# Patient Record
Sex: Male | Born: 1935 | Race: White | Hispanic: No | Marital: Married | Smoking: Never smoker
Health system: Southern US, Community
[De-identification: ages and names within clinical notes are randomized; demographics above are authoritative.]

## PROBLEM LIST (undated history)

## (undated) DIAGNOSIS — K469 Unspecified abdominal hernia without obstruction or gangrene: Secondary | ICD-10-CM

## (undated) DIAGNOSIS — I82409 Acute embolism and thrombosis of unspecified deep veins of unspecified lower extremity: Secondary | ICD-10-CM

## (undated) DIAGNOSIS — I480 Paroxysmal atrial fibrillation: Secondary | ICD-10-CM

## (undated) DIAGNOSIS — N183 Chronic kidney disease, stage 3 unspecified: Secondary | ICD-10-CM

## (undated) DIAGNOSIS — E785 Hyperlipidemia, unspecified: Secondary | ICD-10-CM

## (undated) DIAGNOSIS — I2699 Other pulmonary embolism without acute cor pulmonale: Secondary | ICD-10-CM

## (undated) DIAGNOSIS — I351 Nonrheumatic aortic (valve) insufficiency: Secondary | ICD-10-CM

## (undated) DIAGNOSIS — N400 Enlarged prostate without lower urinary tract symptoms: Secondary | ICD-10-CM

## (undated) DIAGNOSIS — R911 Solitary pulmonary nodule: Secondary | ICD-10-CM

## (undated) DIAGNOSIS — R55 Syncope and collapse: Secondary | ICD-10-CM

## (undated) DIAGNOSIS — I77810 Thoracic aortic ectasia: Secondary | ICD-10-CM

## (undated) DIAGNOSIS — K219 Gastro-esophageal reflux disease without esophagitis: Secondary | ICD-10-CM

## (undated) DIAGNOSIS — I4510 Unspecified right bundle-branch block: Secondary | ICD-10-CM

## (undated) HISTORY — DX: Hyperlipidemia, unspecified: E78.5

## (undated) HISTORY — PX: LIPOMA RESECTION: SHX23

## (undated) HISTORY — DX: Chronic kidney disease, stage 3 (moderate): N18.3

## (undated) HISTORY — DX: Paroxysmal atrial fibrillation: I48.0

## (undated) HISTORY — DX: Unspecified right bundle-branch block: I45.10

## (undated) HISTORY — DX: Nonrheumatic aortic (valve) insufficiency: I35.1

## (undated) HISTORY — DX: Unspecified abdominal hernia without obstruction or gangrene: K46.9

## (undated) HISTORY — DX: Acute embolism and thrombosis of unspecified deep veins of unspecified lower extremity: I82.409

## (undated) HISTORY — PX: OTHER SURGICAL HISTORY: SHX169

## (undated) HISTORY — DX: Benign prostatic hyperplasia without lower urinary tract symptoms: N40.0

## (undated) HISTORY — DX: Other pulmonary embolism without acute cor pulmonale: I26.99

## (undated) HISTORY — DX: Gastro-esophageal reflux disease without esophagitis: K21.9

## (undated) HISTORY — PX: INGUINAL HERNIA REPAIR: SUR1180

## (undated) HISTORY — DX: Chronic kidney disease, stage 3 unspecified: N18.30

## (undated) HISTORY — DX: Solitary pulmonary nodule: R91.1

## (undated) HISTORY — DX: Thoracic aortic ectasia: I77.810

## (undated) HISTORY — DX: Syncope and collapse: R55

---
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
## 2006-11-26 ENCOUNTER — Ambulatory Visit: Payer: Self-pay | Admitting: Internal Medicine

## 2006-11-26 ENCOUNTER — Encounter: Payer: Self-pay | Admitting: Internal Medicine

## 2006-11-26 ENCOUNTER — Inpatient Hospital Stay (HOSPITAL_COMMUNITY): Admission: AD | Admit: 2006-11-26 | Discharge: 2006-12-04 | Payer: Self-pay | Admitting: Internal Medicine

## 2006-11-26 ENCOUNTER — Ambulatory Visit: Payer: Self-pay | Admitting: Cardiology

## 2006-11-26 ENCOUNTER — Ambulatory Visit: Payer: Self-pay | Admitting: Vascular Surgery

## 2006-11-26 LAB — CONVERTED CEMR LAB
Basophils Relative: 0.2 % (ref 0.0–1.0)
CO2: 23 meq/L (ref 19–32)
Creatinine, Ser: 1.4 mg/dL (ref 0.4–1.5)
HCT: 38.7 % — ABNORMAL LOW (ref 39.0–52.0)
Hemoglobin: 13.6 g/dL (ref 13.0–17.0)
INR: 1.1 (ref 0.9–2.0)
MCHC: 35 g/dL (ref 30.0–36.0)
Monocytes Absolute: 1.4 10*3/uL — ABNORMAL HIGH (ref 0.2–0.7)
Neutrophils Relative %: 71.5 % (ref 43.0–77.0)
Potassium: 4.5 meq/L (ref 3.5–5.1)
Prothrombin Time: 13 s (ref 10.0–14.0)
RDW: 12.1 % (ref 11.5–14.6)
Sodium: 135 meq/L (ref 135–145)

## 2006-11-27 ENCOUNTER — Encounter: Payer: Self-pay | Admitting: Internal Medicine

## 2006-12-02 ENCOUNTER — Encounter: Payer: Self-pay | Admitting: Internal Medicine

## 2006-12-05 ENCOUNTER — Ambulatory Visit: Payer: Self-pay | Admitting: Internal Medicine

## 2006-12-12 ENCOUNTER — Ambulatory Visit: Payer: Self-pay | Admitting: Cardiology

## 2006-12-12 ENCOUNTER — Ambulatory Visit: Payer: Self-pay | Admitting: Internal Medicine

## 2006-12-12 LAB — CONVERTED CEMR LAB
Eosinophils Absolute: 0.2 10*3/uL (ref 0.0–0.6)
Lymphocytes Relative: 46.5 % — ABNORMAL HIGH (ref 12.0–46.0)
MCV: 96.9 fL (ref 78.0–100.0)
Monocytes Relative: 11.3 % — ABNORMAL HIGH (ref 3.0–11.0)
Neutro Abs: 2.3 10*3/uL (ref 1.4–7.7)
Platelets: 335 10*3/uL (ref 150–400)
Sed Rate: 15 mm/hr (ref 0–20)

## 2006-12-18 ENCOUNTER — Ambulatory Visit: Payer: Self-pay

## 2006-12-18 ENCOUNTER — Ambulatory Visit: Payer: Self-pay | Admitting: Cardiovascular Disease

## 2006-12-18 ENCOUNTER — Ambulatory Visit: Payer: Self-pay | Admitting: Internal Medicine

## 2006-12-31 ENCOUNTER — Ambulatory Visit (HOSPITAL_COMMUNITY): Admission: RE | Admit: 2006-12-31 | Discharge: 2006-12-31 | Payer: Self-pay | Admitting: Internal Medicine

## 2007-01-02 ENCOUNTER — Ambulatory Visit: Payer: Self-pay | Admitting: Internal Medicine

## 2007-01-19 ENCOUNTER — Ambulatory Visit: Payer: Self-pay | Admitting: Internal Medicine

## 2007-01-19 LAB — CONVERTED CEMR LAB
BUN: 17 mg/dL (ref 6–23)
CO2: 26 meq/L (ref 19–32)
GFR calc Af Amer: 64 mL/min
Potassium: 3.8 meq/L (ref 3.5–5.1)

## 2007-02-03 ENCOUNTER — Ambulatory Visit: Payer: Self-pay | Admitting: Cardiology

## 2007-02-03 ENCOUNTER — Ambulatory Visit (HOSPITAL_COMMUNITY): Admission: RE | Admit: 2007-02-03 | Discharge: 2007-02-03 | Payer: Self-pay | Admitting: Internal Medicine

## 2007-02-03 ENCOUNTER — Ambulatory Visit: Payer: Self-pay | Admitting: Internal Medicine

## 2007-02-12 ENCOUNTER — Ambulatory Visit: Payer: Self-pay

## 2007-02-12 ENCOUNTER — Ambulatory Visit: Payer: Self-pay | Admitting: Cardiology

## 2007-02-14 DIAGNOSIS — I82409 Acute embolism and thrombosis of unspecified deep veins of unspecified lower extremity: Secondary | ICD-10-CM

## 2007-02-14 DIAGNOSIS — Z87898 Personal history of other specified conditions: Secondary | ICD-10-CM

## 2007-02-14 DIAGNOSIS — Z7901 Long term (current) use of anticoagulants: Secondary | ICD-10-CM

## 2007-02-14 DIAGNOSIS — I2699 Other pulmonary embolism without acute cor pulmonale: Secondary | ICD-10-CM

## 2007-02-14 DIAGNOSIS — K219 Gastro-esophageal reflux disease without esophagitis: Secondary | ICD-10-CM

## 2007-02-14 DIAGNOSIS — R911 Solitary pulmonary nodule: Secondary | ICD-10-CM

## 2007-04-14 ENCOUNTER — Ambulatory Visit: Payer: Self-pay | Admitting: Internal Medicine

## 2007-06-11 ENCOUNTER — Telehealth (INDEPENDENT_AMBULATORY_CARE_PROVIDER_SITE_OTHER): Payer: Self-pay | Admitting: *Deleted

## 2007-06-11 ENCOUNTER — Encounter: Payer: Self-pay | Admitting: Internal Medicine

## 2007-07-14 ENCOUNTER — Ambulatory Visit: Payer: Self-pay | Admitting: Internal Medicine

## 2007-07-30 ENCOUNTER — Encounter: Payer: Self-pay | Admitting: Internal Medicine

## 2007-07-30 ENCOUNTER — Telehealth (INDEPENDENT_AMBULATORY_CARE_PROVIDER_SITE_OTHER): Payer: Self-pay | Admitting: *Deleted

## 2007-07-30 ENCOUNTER — Ambulatory Visit (HOSPITAL_COMMUNITY): Admission: RE | Admit: 2007-07-30 | Discharge: 2007-07-30 | Payer: Self-pay | Admitting: Internal Medicine

## 2007-07-30 ENCOUNTER — Ambulatory Visit: Payer: Self-pay | Admitting: *Deleted

## 2007-07-31 ENCOUNTER — Telehealth (INDEPENDENT_AMBULATORY_CARE_PROVIDER_SITE_OTHER): Payer: Self-pay | Admitting: *Deleted

## 2007-10-20 ENCOUNTER — Encounter: Payer: Self-pay | Admitting: Internal Medicine

## 2007-12-02 ENCOUNTER — Ambulatory Visit: Payer: Self-pay | Admitting: Internal Medicine

## 2008-01-25 ENCOUNTER — Telehealth (INDEPENDENT_AMBULATORY_CARE_PROVIDER_SITE_OTHER): Payer: Self-pay | Admitting: *Deleted

## 2008-01-25 ENCOUNTER — Telehealth: Payer: Self-pay | Admitting: Internal Medicine

## 2008-03-07 ENCOUNTER — Telehealth: Payer: Self-pay | Admitting: Internal Medicine

## 2008-04-19 ENCOUNTER — Ambulatory Visit: Payer: Self-pay | Admitting: Internal Medicine

## 2008-04-21 ENCOUNTER — Ambulatory Visit: Payer: Self-pay

## 2008-10-26 DIAGNOSIS — R079 Chest pain, unspecified: Secondary | ICD-10-CM

## 2008-10-27 ENCOUNTER — Ambulatory Visit: Payer: Self-pay

## 2008-10-27 ENCOUNTER — Telehealth: Payer: Self-pay | Admitting: Internal Medicine

## 2008-10-27 ENCOUNTER — Ambulatory Visit: Payer: Self-pay | Admitting: Internal Medicine

## 2009-03-25 IMAGING — XA IR TRANSCATH RETRIEVAL FB
1 series · 12 of 18 positions shown · IV contrast (agent unspecified)
Comparison: none

CLINICAL DATA: 73-year-old male with recent history of DVT complicated by pulmonary embolus.    Temporary filter was inserted.  The patient is now currently doing well on anticoagulation, which will be continued for 6 months.  Because of this, the IVC will be studied, and if there is no clot burden or retained clot in the filter, filter removal will be attempted. 
ULTRASOUND GUIDANCE FOR VASCULAR ACCESS:
IVC CATHETERIZATION AND VENOGRAM:
TRANSCATHETER RETRIEVAL OF IVC FILTER:
Radiologist:  Brehiner Merchancano, M.D.
Guidance:  Ultrasound and fluoroscopic. 
Complications:  No immediate complications. 
Medications:  1 mg Versed and 50 mcg fentanyl. 
Sedation time:  30 minutes. 
Contrast:  60 cc. 
Fluoroscopy:  4.6 minutes. 
Procedure/Findings:  Informed consent was obtained from the patient following explanation of the procedure, risks, benefits, and alternatives.  The patient understands, agrees, and consents.  All questions were addressed. 
Right IJ micropuncture venous access was performed with ultrasound.  Images were obtained for documentation.  
An 0.018 guidewire was advanced centrally followed by a 4-French dilator.  This allowed insertion of a 0.035 guidewire advanced into the IVC.  Over the guidewire, a 5-French pigtail was advanced below the filter within the IVC bifurcation.  IVC venogram was performed. 
IVC Venogram:  IVC is patent and normal.  No evidence of retained clot, stenosis, or occlusive disease.  Filter is in stable position within the infrarenal IVC.  The pigtail was removed over an Amplatz guidewire.  Guidewire access was manipulated into the left iliac venous system.  This approximated the guidewire closest to the filter apex.  Dilatation was performed to advance the retrieval sheath.  The retrieval sheath was advanced to the apex.  Through the sheath and over the guidewire, the retrieval umbrella was advanced and positioned at the apex.  The filter apex was captured with the retrieval device and retracted into the sheath.  The filter completely collapsed and was removed over the guidewire without complication.  The outer sheath was syringe aspirated and flushed with saline.  Contrast injection was repeated for a follow-up IVC venogram demonstrating a patent IVC with no complication, clot, or abnormality. 
Hemostasis was obtained with compression.  The patient tolerated the procedure well.  There were no immediate complications.

[Series 1: run · 12 of 18 slices shown]
[im 1/18]
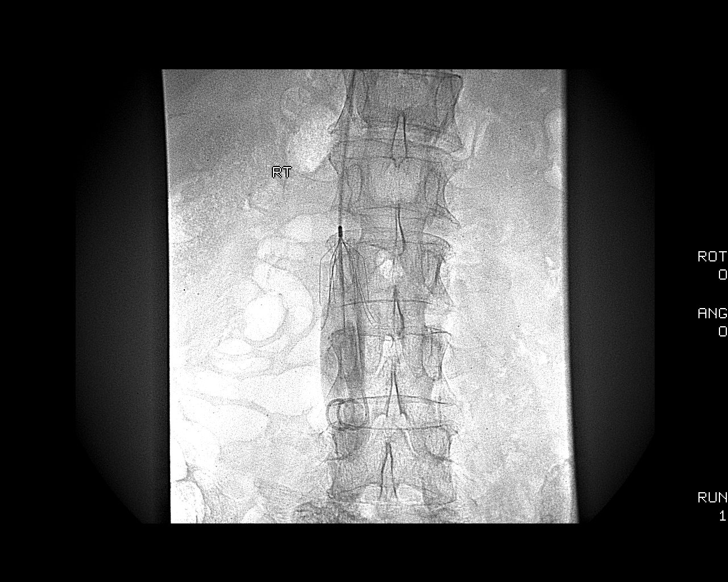
[im 3/18]
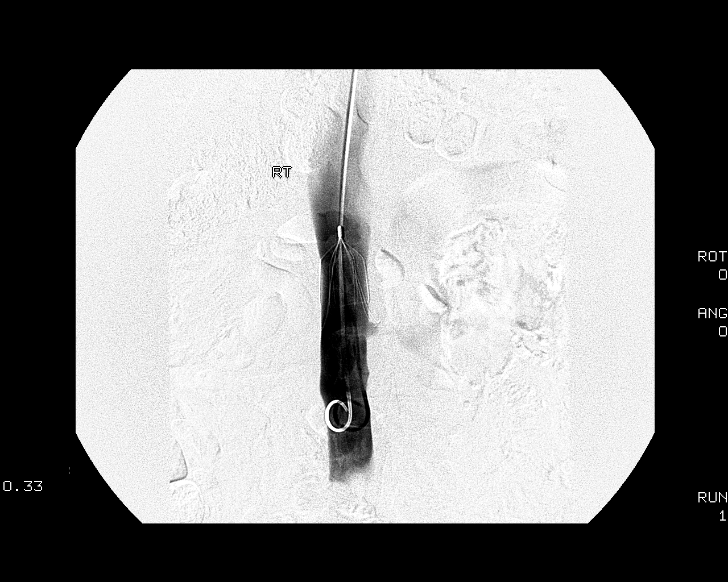
[im 4/18]
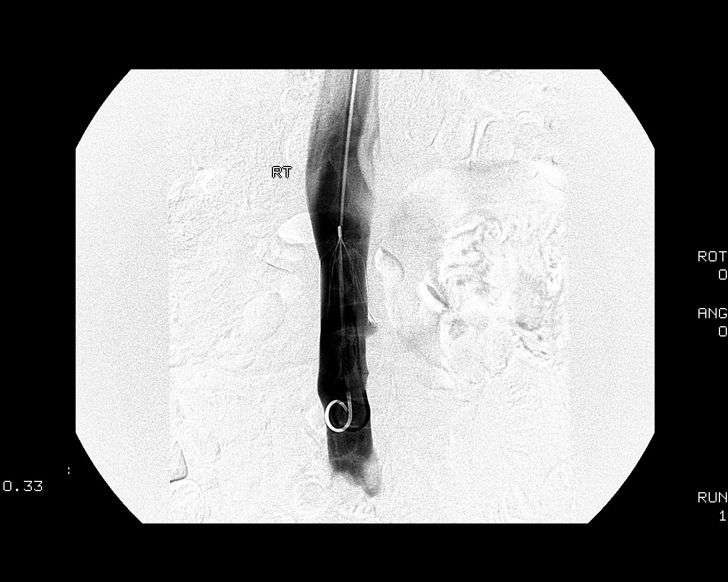
[im 6/18]
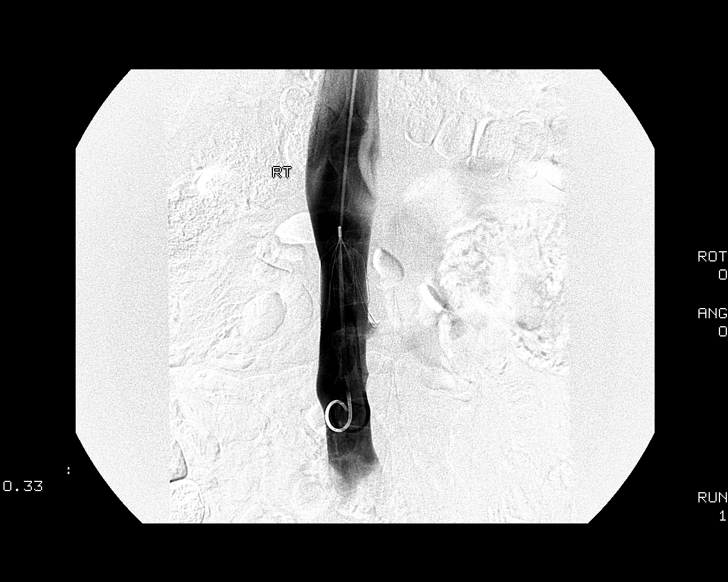
[im 7/18]
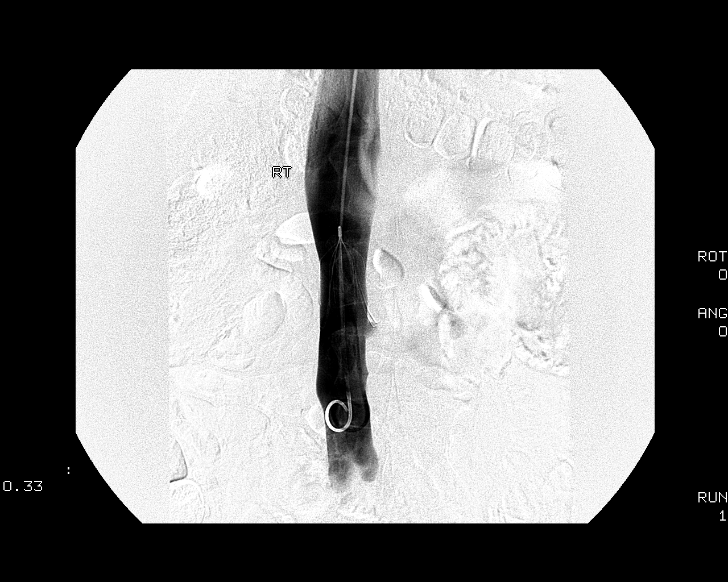
[im 9/18]
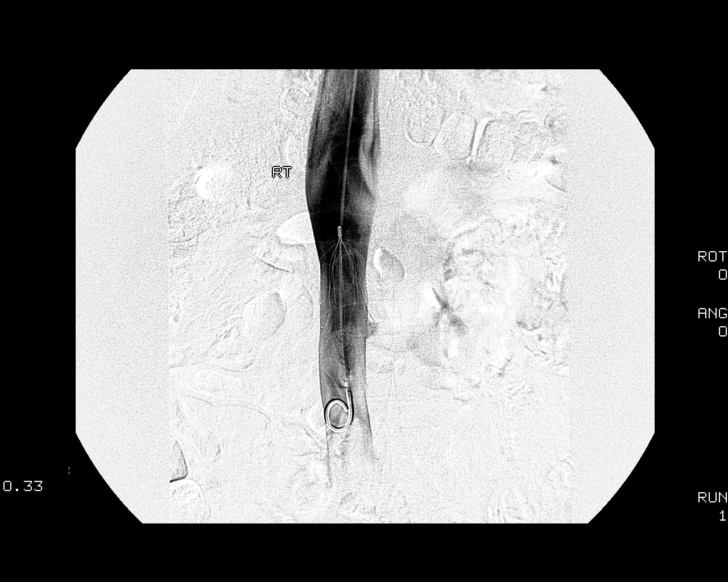
[im 10/18]
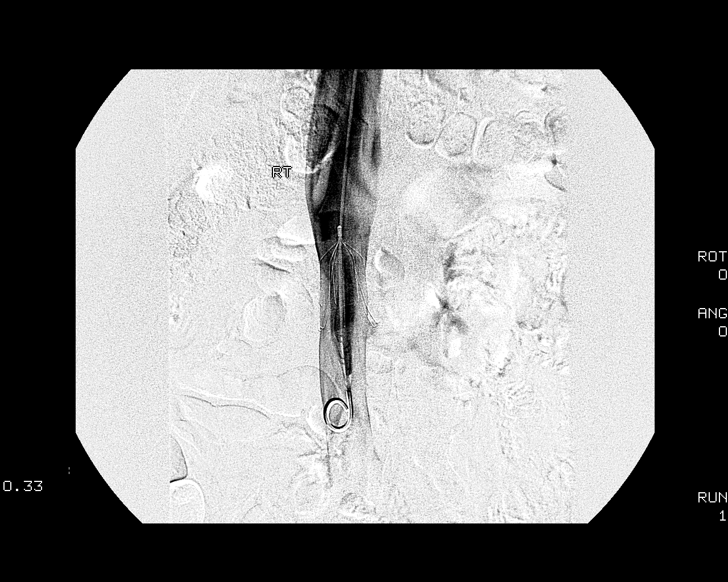
[im 12/18]
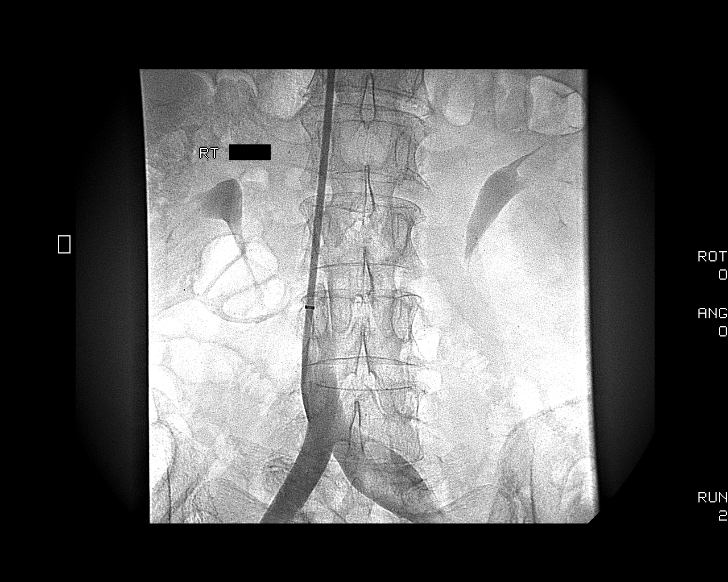
[im 13/18]
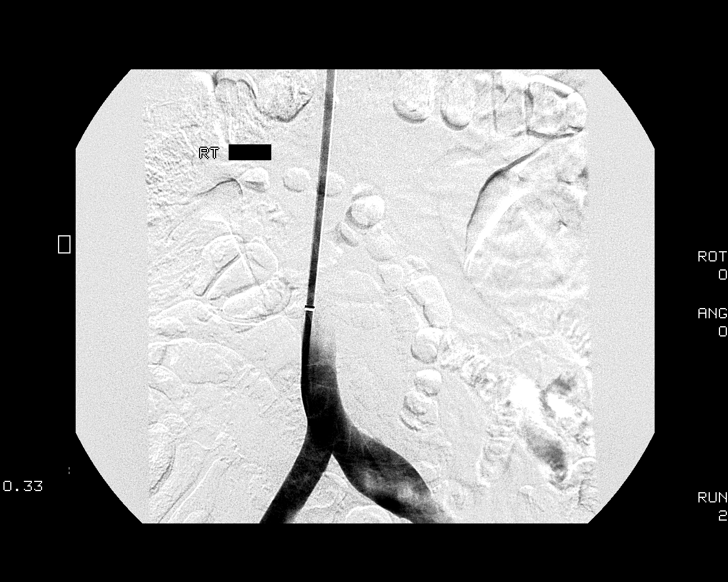
[im 15/18]
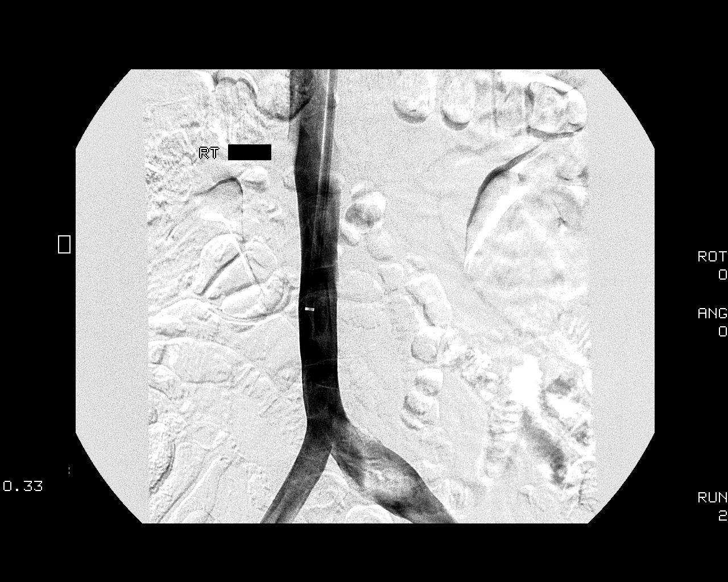
[im 16/18]
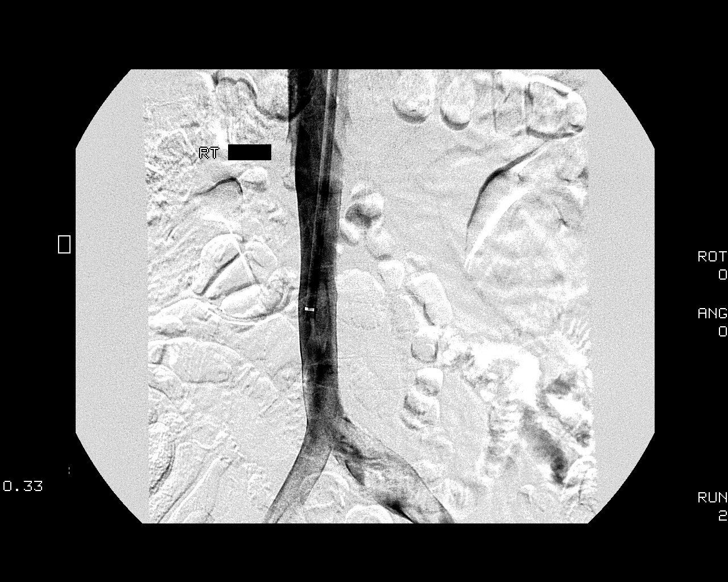
[im 18/18]
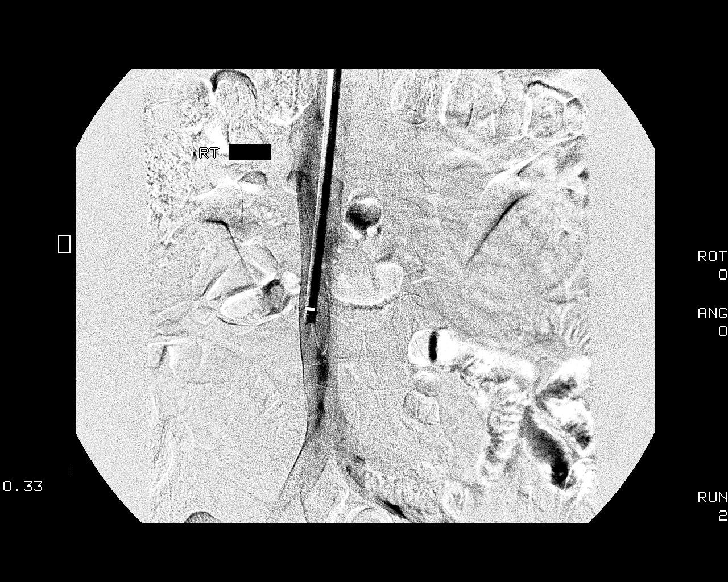

[12 of 18 positions shown; findings below may reference images not displayed]

IMPRESSION: Successful removal of an infrarenal IVC G2 filter.  No complication.

## 2009-04-13 ENCOUNTER — Encounter: Payer: Self-pay | Admitting: Internal Medicine

## 2009-08-25 ENCOUNTER — Telehealth (INDEPENDENT_AMBULATORY_CARE_PROVIDER_SITE_OTHER): Payer: Self-pay | Admitting: *Deleted

## 2009-08-28 ENCOUNTER — Encounter: Payer: Self-pay | Admitting: Internal Medicine

## 2009-08-29 ENCOUNTER — Telehealth: Payer: Self-pay | Admitting: Internal Medicine

## 2009-09-01 ENCOUNTER — Ambulatory Visit: Payer: Self-pay | Admitting: Internal Medicine

## 2009-09-01 DIAGNOSIS — J209 Acute bronchitis, unspecified: Secondary | ICD-10-CM

## 2009-09-01 DIAGNOSIS — J018 Other acute sinusitis: Secondary | ICD-10-CM

## 2009-10-06 ENCOUNTER — Ambulatory Visit: Payer: Self-pay | Admitting: Internal Medicine

## 2009-10-13 ENCOUNTER — Encounter: Payer: Self-pay | Admitting: Internal Medicine

## 2010-06-06 NOTE — Progress Notes (Signed)
Summary: appt  Phone Note Call from Patient Call back at Home Phone [phone]   Caller: Patient Call For: young Reason for Call: Talk to Nurse Summary of Call: fever, sinus trouble,chest cold.  Went to [facility], thought he had pna, got meds, but still has crackling in left lung, had cxr, local doc says there is scare tissue in left lung, says bronchitis may turn into emphysema.    Wants to see CDY before 1st available on 05/23 - can you work something out for him? Initial call taken by: Eugene Gavia,  August 29, 2009 11:44 AM  Follow-up for Phone Call        pt requesting an appt, last seen 2009 by CY. Per KW ok to offer Thursday 08/31/09 at 11:30. pt advised of appt and advised to arrive at 11:15. Carron Curie CMA  August 29, 2009 12:12 PM

## 2010-06-06 NOTE — Assessment & Plan Note (Signed)
Summary: congestion/jd   Primary Provider/Referring Provider:  Tedra Senegal  CC:  Accute visit-congestion(chest);recheck from recent visit with PCP. [name]  History of Present Illness: 12/02/07- 77 year old man, previously seen for Pulmonary  embolism with DVT.  Returns now for follow-up.  His primary physician in [place] tracks his Coumadin.  On CT scan in September.he had lung nodules right middle lobe and right lower lobe.  "Branching, suggested inflammatory".  He has had more frequent waking at night due to  urinary infection and had a herniated disk treated conservatively.  He has not had new chest complaints, cough, phlegm, fever or chills, purulent or bloody sputum, adenopathy, or edema.  September 01, 2009- Hx DVT/PE..........Justin Kitchenwife here Hx PE in July of 2008. He had remained on coumadin. No bleeding or problems with it- being managed by Dr Tedra Senegal. He had episodes of rhinosinusitis and bronchitis in December with some lingering or recurrence. He has had a hx of winter-time sinus infections and bronchitis. The recent bout cleared but then recurred in April when he wheezed more. His primary doctor gave mucinex, dulera 100/5, prednisone taper, R-Tanna, Levaquin(finished). Hx nasal stuffiness from old baseball injury. He expects airway issues to resolve with the summer. He is concerned that he feels weak. Some dyspnea with exertion. CXR- 08/28/09- described some scarring in left base. He feels a little shakey from meds and voice feels a little weak.Was faint in [place]- transient.  He was concerned about the CXR "scarring".        Current Medications (verified): 1)  Adult Aspirin Low Strength 81 Mg  Tbdp (Aspirin) .[name].. 1 By Mouth Once Daily 2)  Uroxatral 10 Mg  Tb24 (Alfuzosin Hcl) .[name].. 1 By Mouth Once Daily 3)  Crestor 10 Mg  Tabs (Rosuvastatin Calcium) .[name].. 1 By Mouth At Bedtime 4)  Nexium 40 Mg  Cpdr (Esomeprazole Magnesium) .[name].. 1 By Mouth Once Daily 5)  Fish Oil   Oil (Fish Oil)  .[name].. 1 By Mouth Once Daily 6)  Avodart 0.5 Mg  Caps (Dutasteride) .[name].. 1 Once Daily 7)  Niaspan 1000 Mg Cr-Tabs (Niacin (Antihyperlipidemic)) .[name].. 1 By Mouth Once Daily 8)  Warfarin Sodium 5 Mg Tabs (Warfarin Sodium) .... Use As Directed By Anticoagulation Clinic 9)  Amitiza 8 Mcg Caps (Lubiprostone) .... Take 1 By Mouth Two Times A Day  Allergies (verified): 1)  ! 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   Risk Factors: Smoking Status: never (10/27/2008)  Review of Systems      See HPI       The patient complains of prolonged cough.  The patient denies anorexia, fever, weight loss, weight gain, vision loss, decreased hearing, hoarseness, chest pain, syncope, dyspnea on exertion, peripheral edema, headaches, hemoptysis, abdominal pain, and severe indigestion/heartburn.         Skin at right ankle slight hyperpigmentation  Vital Signs:  Patient profile:   77 year old male Height:  72 inches Weight:      195.50 pounds BMI:     26.61 O2 Sat:      95 % on Room air Pulse rate:   89 / minute BP sitting:   118 / 78  (left arm) Cuff size:   regular  Vitals Entered By: Reynaldo Minium CMA (September 01, 2009 9:11 AM)  O2 Flow:  Room air  Physical Exam  Additional Exam:  GENERAL:  A/Ox3; pleasant & cooperative.NAD HEENT:  [place]/AT, EOM-wnl,  PERRLA, periorbital edema, EACs-clear, TMs-wnl, NOSE-septal deviation, external deviation, THROAT-clear & wnl. Mallampati  III-IV NECK:  Supple w/ fair ROM; no JVD; normal carotid impulses w/o bruits; no thyromegaly or nodules palpated; no lymphadenopathy. CHEST: Clear to P&A, loose upper airway cough. HEART:  RRR, no m/r/g  heard ABDOMEN:  Soft & nt; nml bowel sounds; no organomegaly or masses detected. EXT: Warm bilat,  no calf pain, edema, clubbing, pulses intact,  Skin: no rash/lesion     Impression & Recommendations:  Problem # 1:  Hx of DVT (ICD-453.40) We discussed decision about long term coumadin, absent a known ongoing risk.  He can decide with his doctor whether/ when to come off. No recurrence.  Problem # 2:  RHINOSINUSITIS, ACUTE (ICD-461.8) He doesn't want surgery for his significantly misaligned nose. Suggest he try Lloyd Huger med saline rinse.  Problem # 3:  ACUTE BRONCHITIS (ICD-466.0)  This should clear as his upper airway does. I suggested he have f/u CXR in a few months.  Orders: Est. Patient Level III (16109)  Medications Added to Medication List This Visit: 1)  Amitiza 8 Mcg Caps (Lubiprostone) .... Take 1 by mouth two times a day  Patient Instructions: 1)  Please schedule a return in one year unless needed sooner. 2)  Try Lloyd Huger med type saline nasal rinse- Neti pot or squeeze bottle, once or twice a day when needed. 3)  If nasal congestion doesn't clear, your doctor may want to consider a limited CT of your sinuses. 4)  Suggest follow-up CXR in a month or two to see that "scarring " in the left lower lobe is stable or clearing.

## 2010-06-06 NOTE — Assessment & Plan Note (Signed)
Summary: rov/ gd   Primary Provider:  Tedra Senegal   History of Present Illness: [name] is a very pleasant 77 year old male with a history of pulmonary emboli diagnosed in July of 2008. Remains on coumadin. He does not have any known history of coronary artery disease.  He had a normal stress echocardiogram in September of 2007 with his previous cardiologist.  He also has a history of hyperlipidemia and recurrent syncope secondary to vasovagal syndrome with a positive tilt test.   Had an episode of CP in 3/10 and saw Dr. Ladona Ridgel for unschedule visit. Had GXT which was normal.   Returns for yearly f/u. In march had a presyncopal episode from what sounds like volume depletion. Since that time doing well. It has not recurred. Not exercising regulalry. Denies CP or SOB. Bruises easily.    Current Medications (verified): 1)  Adult Aspirin Low Strength 81 Mg  Tbdp (Aspirin) .[name].. 1 By Mouth Once Daily 2)  Uroxatral 10 Mg  Tb24 (Alfuzosin Hcl) .[name].. 1 By Mouth Once Daily 3)  Crestor 10 Mg  Tabs (Rosuvastatin Calcium) .[name].. 1 By Mouth At Bedtime 4)  Nexium 40 Mg  Cpdr (Esomeprazole Magnesium) .[name].. 1 By Mouth Once Daily 5)  Fish Oil   Oil (Fish Oil) .[name].. 1 By Mouth Once Daily 6)  Avodart 0.5 Mg  Caps (Dutasteride) .[name].. 1 Once Daily 7)  Niaspan 500 Mg Cr-Tabs (Niacin (Antihyperlipidemic)) .[name].. 1 By Mouth Daily 8)  Warfarin Sodium 5 Mg Tabs (Warfarin Sodium) .... Use As Directed By Anticoagulation Clinic 9)  Amitiza 8 Mcg Caps (Lubiprostone) .... Take 1 By Mouth Two Times A Day 10)  Fexofenadine Hcl 180 Mg Tabs (Fexofenadine Hcl) .[name].. 1 By Mouth Daily 11)  Aspirin 81 Mg  Tabs (Aspirin) .[name].. 1 By Mouth Daily  Allergies (verified): 1)  ! * Multivitamin  Past History:  Past Medical History: Hx of PE (ICD-415.19) Hx of DVT (ICD-453.40) Hx of LUNG NODULE (ICD-518.89) GERD (ICD-530.81) BENIGN PROSTATIC HYPERTROPHY, HX OF (ICD-V13.8) Hyperlipidemia Recurrent syncope secondary to vasovagal syndrome  w/positive tilt test CP   --normal GXT  Review of Systems       As per HPI and past medical history; otherwise all systems negative.   Vital Signs:  Patient profile:   77 year old male Height:      72 inches Weight:      196 pounds BMI:     26.68 Pulse rate:   82 / minute Resp:     16 per minute BP sitting:   128 / 82  (right arm)  Vitals Entered By: Marrion Coy, CNA (October 06, 2009 3:23 PM)  Physical Exam  General:  Gen: well appearing. no resp difficulty HEENT: normal Neck: supple. no JVD. Carotids 2+ bilat; no bruits. No lymphadenopathy or thryomegaly appreciated. Cor: PMI nondisplaced. Regular rate & rhythm. No rubs, gallops, murmur. Lungs: clear Abdomen: soft, nontender, nondistended. No hepatosplenomegaly. No bruits or masses. Good bowel sounds. Extremities: no cyanosis, clubbing, rash, edema Neuro: alert & orientedx3, cranial nerves grossly intact. moves all 4 extremities w/o difficulty. affect pleasant    Impression & Recommendations:  Problem # 1:  Hx of PE (ICD-415.19) We had a long talk about the duration of Coumadin therapy.  Given an unprovoked pulmonary embolus there is some rationale to treat lifelong, however, more standard it is done for a year to 18 months. As he had a life-threatening PE in past and has been doing well on coumadin we have opted to consider.  Other Orders: EKG w/ Interpretation (93000)  Patient Instructions: 1)  Your physician wants you to follow-up in:  1 year.  You will receive a reminder letter in the mail two months in advance. If you don't receive a letter, please call our office to schedule the follow-up appointment.   Appended Document: rov/ gd note faxed to Dr Maxcine Ham at [phone]

## 2010-06-06 NOTE — Progress Notes (Signed)
  Phone Note Call from Patient   Caller: Patient Reason for Call: Acute Illness Summary of Call: Called answering service this am regarding 2 day history of congestion, low grade fever which is now resolved, wheezing and sinus drainage.  Was given zpak, allergra by primary.  Denies chest pain, sputum production. Pt reports last seen by Dr. Maple [name] in 08.  Has new PCP- Dr. Tedra Senegal.  Pt questioning when he can come off coumadin-apparently is still on and having labs for such.  Wants to stop.  Discussed he needs to make appt with Dr. Maple [name] on Monday and follow up regarding coumadin.  Instructed to continue zpak and allegra until gone.  Also stressed importance of follow up on coumadin/labs. Initial call taken by: Canary Brim NP-C,  August 25, 2009 4:14 PM

## 2010-08-29 ENCOUNTER — Encounter: Payer: Self-pay | Admitting: Internal Medicine

## 2010-08-31 ENCOUNTER — Encounter: Payer: Self-pay | Admitting: Internal Medicine

## 2010-08-31 ENCOUNTER — Ambulatory Visit (INDEPENDENT_AMBULATORY_CARE_PROVIDER_SITE_OTHER): Payer: Self-pay | Admitting: Internal Medicine

## 2010-08-31 VITALS — BP 124/82 | HR 68 | Ht 72.0 in | Wt 199.6 lb

## 2010-08-31 DIAGNOSIS — J209 Acute bronchitis, unspecified: Secondary | ICD-10-CM

## 2010-08-31 DIAGNOSIS — I2699 Other pulmonary embolism without acute cor pulmonale: Secondary | ICD-10-CM

## 2010-08-31 DIAGNOSIS — J984 Other disorders of lung: Secondary | ICD-10-CM

## 2010-08-31 NOTE — Assessment & Plan Note (Addendum)
What we had seen 3-4 years ago looked like a bronchiolitis with some mucus plugging. With nothing sen on last year's film, we will drop the issue.

## 2010-08-31 NOTE — Assessment & Plan Note (Addendum)
His mild DOE is moslty deconditioning and obesity. He had used Dulera once before with an acute bronchitis. I will let him retry a sample now and see if it helps anything. I doubt major cardiopulmonary change.

## 2010-08-31 NOTE — Progress Notes (Signed)
  Subjective:    Patient ID: [name], male    DOB: Oct 28, 1932, 77 y.o.   MRN: [id number]  HPI 29 yoM never smoker with past hx of DVT/ PE and bronchitis, complicated by hx of lung nodule and GERD. Last here September 01, 2009. His doctor prefers to keep INR 2.5 to 3.5. There have been no more clots or bleeding. His cardiologists following him after remote question of Mitral Prolapse have suggested to him that maybe he should stay on coumadin. This week in pollen season has noted a very little wheeze as he goes to bed, attributed to pollen. He notes gradually a little more dyspnea with exertion- discussed in terms of age and fitness. He notes snore despite nasal strips. At home he always sleeps on his side.    Review of Systems See HPI Constitutional:   No weight loss, night sweats,  Fevers, chills, fatigue, lassitude. HEENT:   No headaches,  Difficulty swallowing,  Tooth/dental problems,  Sore throat,                No sneezing, itching, ear ache,,   CV:  No chest pain,  Orthopnea, PND, swelling in lower extremities, anasarca, dizziness, palpitations  GI  No heartburn, indigestion, abdominal pain, nausea, vomiting, diarrhea, change in bowel habits, loss of appetite  Resp: .  No excess mucus, no productive cough,  No non-productive cough,  No coughing up of blood.  No change in color of mucus.  No wheezing.   Skin: no rash or lesions.  GU: no dysuria, change in color of urine, no urgency or frequency.  No flank pain.  MS:  No joint pain or swelling.  No decreased range of motion.  No back pain.  Psych:  No change in mood or affect. No depression or anxiety.  No memory loss.     Objective:   Physical Exam General- Alert, Oriented, Affect-appropriate, Distress- none acute  Skin- rash-none, lesions- none, excoriation- none  Lymphadenopathy- none  Head- atraumatic  Eyes- Gross vision intact, PERRLA, conjunctivae clear secretions  Ears- Normal- Hearing, canals, Tm L ,R  ,  Nose- Stuffy, Septal dev, mucus, some mucus bridging,   No-  polyps, erosion, perforation   Throat- Mallampati III , mucosa clear , drainage- none, tonsils- atrophic  Neck- flexible , trachea midline, no stridor , thyroid nl, carotid no bruit  Chest - symmetrical excursion , unlabored     Heart/CV- RRR , no murmur , no gallop  , no rub, nl s1 s2                     - JVD- none , edema- none, stasis changes- none, varices- none     Lung- clear to P&A, wheeze- none, cough- none , dullness-none, rub- none     Chest wall-  Abd- tender-no, distended-no, bowel sounds-present, HSM- no.     Overweight with abdominal obesity.   Br/ Gen/ Rectal- Not done, not indicated  Extrem- cyanosis- none, clubbing, none, atrophy- none, strength- nl  Neuro- grossly intact to observation         Assessment & Plan:

## 2010-08-31 NOTE — Assessment & Plan Note (Signed)
No recurrence. I have left it up to to his PCP to decide when to stop coumadin.

## 2010-08-31 NOTE — Patient Instructions (Signed)
Consider trying Afrin before dental visits  Ask your dentist about oral appliances for sleep apnea and snoring. A good local source here would be Dr Althea Grimmer- orthodontist.   Sample Dulera 100-5    2 puffs and rinse mouth, twice daily. You should know in a week it it is making any improvement in your shortness of breath. It will still be good for you to try to walk more

## 2010-09-18 NOTE — H&P (Signed)
[name], [name]            ACCOUNT NO.:  [id number]   MEDICAL RECORD NO.:  [id number]          PATIENT TYPE:  INP   LOCATION:  2104                         FACILITY:  [facility]   PHYSICIAN:  Clinton D. Maple [name], MD, [name], FACPDATE OF BIRTH:  February 10, 1935   DATE OF ADMISSION:  11/26/2006  DATE OF DISCHARGE:                              HISTORY & PHYSICAL   PROBLEMS:  1. Multiple pulmonary emboli with infarction.  2. Deep vein thrombosis left leg.  3. Mitral valve prolapse by history.  4. Elevated cholesterol.  5. Esophageal reflux disease.   HISTORY:  This is a 77 year old nonsmoker in good general health  previously.  Three weeks ago he was walking on a hill, hit his toe and  ended up needing his left great toenail removed.  He was treated then  with Keflex.  Soon after that he had pain in the left hip consistent  with a bursitis by description.  He became short of breath and was  admitted to [facility] about 2 weeks ago with a diagnosis of  pneumonia.  He did not have much cough, no high fever or chills and no  significant sputum.  He was treated initially with Avelox.  Then this  was changed to Rocephin and Zithromax when per family chest x-ray got  worse.  He was discharged on 1 week of intravenous therapy and took an  additional 7 days of Zithromax and Vantin.  He remained short of breath  and weak with some transient posterior right side chest pain.  Wife  became especially concerned last night with he was more nauseated and  dyspneic.  She called her cousin, Dr. Abbey Daniels, who called me last  night.  Family wish to be treated in [place],  and he was brought  into my office as a new patient work-in this morning.  In the office he  gave history of left calf pain, was tender in the left calf and had a  positive Homans' sign.  He was sent for spiral CT scan which was done  this afternoon at the [facility], and I was called  reporting scan positive for  bilateral pulmonary emboli with infarction  at least on the right side.  He was given a loading dose of 10,000 units  of heparin at the [facility] CT office and transported for admission to  [facility].  He has now been seen by pharmacy for protocol management of  heparin and warfarin.  A duplex Doppler of leg veins has been completed  and positive for extensive clot through the left vascular system from  the calf proximally including some loose clot in the femoral vein.   REVIEW OF SYSTEMS:  Significant primarily for weakness and dyspnea.  He  has had some bruising from recent Lovenox but reports no bleeding, no  palpitation.  No anginal-type pain.  He is aware of intestinal gas but  is not actively having heartburn, nausea or vomiting.  There has been no  change in bowel or bladder.  Temperatures have been running 99 to 100  with no hard chills.   FAMILY  HISTORY:  Father died of myocardial infarction in his 69s and  also had renal insufficiency.  Mother is still alive in her 21s with  diabetes.  There is no family history of other clotting disorder known.   PAST MEDICAL HISTORY:  1. Mitral valve prolapse treated with Plavix and atenolol after his      primary physician, now retired, noted extra beats.  There is no      history of other cardiac disease.  2. Hypertension.  3. Syncopal episodes fainting in the past associated with discovery      of the extra beats mentioned above.  4. Inguinal hernia repair x3  5. No history of diabetes or cancer.   VACCINATIONS:  He has had pneumococcal vaccine probably twice.   SOCIAL HISTORY:  Never smoked, married.  Son is an orthopedic nurse at  [employer].  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  He had some  concern about the damp environment of the office space where he worked  and questioned whether any of his respiratory problems were from mold.   OBJECTIVE:   Temperature 98.4, BP 103/77, pulse regular at about 80,  oxygen saturation 100% at rest on 2 liters prongs but during  conversation it will drop as low as 88%.  Skin ecchymoses reflecting IV  insertions and Lovenox therapy in the bilateral forearms.  No adenopathy  found.  He is alert, oriented, a little dyspneic with short sentences  despite his oxygen.  HEENT:  Conjunctivae are clear.  Oral mucosa is clear.  Neck veins are  not distended.  There is no stridor.  CHEST:  Quiet bilaterally.  I hear a trace of crackle in the right  posterior base but no pleural rub.  No real rales, no cough.  HEART:  Regular rhythm without rub or murmur.  I cannot hear a click.  P2 does not seem increased.  ABDOMEN:  Soft, no hepatosplenomegaly.  Bowel sounds present.  GENITALIA AND RECTAL:  Not examined at this time, not directly  pertinent.  EXTREMITIES:  Positive left Homans'.  No ankle edema.  Some tenderness  of left calf.  Recently resected left great toenail.   CHEST X-RAY:  I reviewed the film done at our office today as available  on the computer screen demonstrating infiltrates in the left mid lung  zone and left lower lung zone probably involving the superior segment of  left lower lobe.  Diaphragm was elevated.  No definite pleural effusion   IMPRESSION:  1. Multiple pulmonary emboli.  2. Extensive deep vein thrombosis left leg.  The trigger may have been      toenail resection and associated reduced mobility.  3. History of mitral valve prolapse.  Was already on Plavix and      aspirin and note that he had received Lovenox during his hospital      stay at [facility].  4. Elevated cholesterol.  Had been on Crestor.  5. Esophageal reflux disease.  Has been on Protonix.  6. Recent hospitalization at [facility] for pneumonia.  We will not      know whether he actually had the pneumonia or all of this illness      has been related to deep venous thrombosis and pulmonary embolism.      At this time  it would appear he has had enough antibiotic coverage      and we  can watch clinical course.  The key is to progress his      anticoagulation therapy.  I am discussing with colleagues the      consideration of filter insertion because of the large residual      clot load in his leg.      Clinton D. Maple [name], MD, Tonny Bollman, [name]  Electronically Signed     CDY/MEDQ  D:  11/26/2006  T:  11/27/2006  Job:  [id number]

## 2010-09-18 NOTE — Assessment & Plan Note (Signed)
[facility]                             PULMONARY OFFICE NOTE   NAME:[name], [name]                     MRN:          [id number]  DATE:01/19/2007                            DOB:          [birth year]    PROBLEM LIST:  1. Pulmonary embolism with left leg deep vein thrombosis/Coumadin.  2. Previous diagnosis of mitral valve prolapse, not recently confirmed      on echocardiogram.  3. Benign prostatic hypertrophy.  4. Esophageal reflux.  5. History of lung nodules.   HISTORY:  [name] has been removed.  He is back at work part time.  He  primary physician suggested a trial of Prozac, and [name] is  ruminating on this.  He says on one day he had some left chest twinges  which spontaneously resolved.  An ultrasound of the abdomen, he says,  was good.  He has a pending appointment for cardiology followup with  Dr. Gala Romney.  He is having some of [name] Prothrombin times checked at  [name] primary physicians office, but he still comes some to get checked by  the coagulation clinic at [facility], and he has discussed [name] medications  with the pharmacist there.  He seems to be getting gradually more  active.   MEDICATION:  1. Aspirin 81 mg.  2. Uroxatral 10 mg.  3. Tricor 145 mg.  4. Coumadin 5 mg.  5. Crestor 10 mg.  6. Nexium 40 mg.  7. Fish oil.   We discussed the history that [name] original CT scan around last January  in [place] had shown lung nodules needing followup, and the  likelihood that they would have been obscured by the infiltrates  associated with [name] pulmonary embolism.  A CT angiogram, now, should be  showing progress in the lysis of infrapulmonary vascular clot.  It may  be possible by comparing all 3 CT scans to assess the original lung  nodules if they do not remain obscured.  He is interested in having this  done.   OBJECTIVE:  Weight 190 pounds, BP 120/70, pulse regular 80, room air  saturation 98%.  No apparent  distress.  No neck vein distension or stridor.  Work of breathing has not increased.  Lungs sound clear.  Pulse is regular.  I do not hear a murmur.   IMPRESSION:  1. Status post deep vein thrombosis with pulmonary emboli in late      July, now on Coumadin.  2. Lung nodules wanting some followup.   PLAN:  1. Chemistry panel for renal clearance.  2. Repeat CT angiogram, both to assess the status of [name] pulmonary      emboli from 2 months ago, and to look for lung nodules that could      be compared with [name] January, 2008 CT scan from [place].  [name] wife      is going to bring a copy of [name] disc.  Schedule return 3 months,      earlier p.r.n.     Clinton D. Maple [name], MD, Tonny Bollman, [name]  Electronically Signed    CDY/MedQ  DD: 01/19/2007  DT: 01/20/2007  Job #: [id number]   cc:   Primus Bravo, MD

## 2010-09-18 NOTE — Assessment & Plan Note (Signed)
[facility]                            CARDIOLOGY OFFICE NOTE   KAVON, VALENZA                     MRN:          [id number]  DATE:07/14/2007                            DOB:          1932-09-21    PRIMARY CARE PHYSICIAN:  Ruthann Cancer, MD at [facility].   PULMONOLOGIST:  Rennis Chris. Maple [name], MD.   INTERVAL HISTORY:  Mr. Trost is a very pleasant 77 year old male with  a history of pulmonary emboli diagnosed in 2008.  He also has a history  of reported mitral valve prolapse.  However, echocardiogram did not  confirm this.  He denies any history of coronary artery disease.  He had  a stress echocardiogram with his previous cardiologist in September 2007  which was normal.  He has never had a cardiac catheterization.  He also  has a history of  hyperlipidemia and recurrent syncope secondary to vasovagal syndrome  with a positive tilt test.   He returns today for routine followup.  He is doing well.  No chest pain  or shortness of breath.  He is not walking on a routine basis, however.  About 6 weeks ago, he had some problems with black stools.  This lasted  2 days and then resolved.  He saw a GI doctor who recommended upper GI  series, however, he has not had this yet.  He has not had any problems  with recurrent bleeding.   CURRENT MEDICATIONS:  1. Aspirin 81.  2. Uroxatral 10 mg a day.  3. Tricor 145 a day.  4. Coumadin 5 a day.  5. Crestor 10 a day.  6. Nexium 40 a day.  7. Fish oil.   PHYSICAL EXAMINATION:  GENERAL:  He is well-appearing in no acute  distress.  He ambulates around the clinic without any respiratory  difficulty.  VITAL SIGNS:  Blood pressure is 118/64, heart rate is 69, weight is 194.  HEENT:  Normal.  NECK:  Supple.  No JVD.  Carotids are 2+ bilaterally without any bruits.  There is no lymphadenopathy or thyromegaly.  CARDIAC:  Regular rate and rhythm.  No murmurs, rubs or gallops.  There  are no clicks.  LUNGS:   Clear.  ABDOMEN:  Obese, nontender, nondistended.  No hepatosplenomegaly, no  bruits, no masses.  Good bowel sounds.  EXTREMITIES:  Warm with no cyanosis, clubbing or edema.  No rash.  NEURO:  Alert and oriented x3.  Cranial nerves II-XII are intact.  He  moves all 4 extremities without difficulty.  Affect is pleasant.   DIAGNOSTICS:  EKG shows normal sinus rhythm at a rate of 69.  No ST/T  wave abnormalities.   ASSESSMENT/PLAN:  1. History of pulmonary emboli.  He is on Coumadin.  He is doing well.  2. Hyperlipidemia.  This is followed by his primary care physician.  3. History of mitral valve prolapse.  This is not evident on      echocardiogram.  I have reassured him.   DISPOSITION:  He is doing well.  We will see him back in 9 months for  routine followup.  I have encouraged him to be more active.     Bevelyn Buckles. Bensimhon, MD  Electronically Signed    DRB/MedQ  DD: 07/14/2007  DT: 07/15/2007  Job #: [id number]   cc:   Joni Fears D. Maple [name], MD, [name], [name]  Ruthann Cancer, MD

## 2010-09-18 NOTE — Assessment & Plan Note (Signed)
[name]                            CARDIOLOGY OFFICE NOTE   [name], [name]                     MRN:          [id number]  DATE:04/19/2008                            DOB:          [birth year]    PRIMARY CARE PHYSICIAN:  Dr. Tedra Daniels in [place].   PULMONOLOGIST:  Dr. Fannie Daniels.   INTERVAL HISTORY:  [name] is a very pleasant 77 year old male with a  history of pulmonary emboli diagnosed in July of 2008.  He does not have  any known history of coronary artery disease.  He had a normal stress  echocardiogram in September of 2007 with his previous cardiologist.  He  also has a history of hyperlipidemia and recurrent syncope secondary to  vasovagal syndrome with a positive tilt test.   He returns today for routine follow-up.  Overall he is doing well.  He  has been in discussion with Dr. Fannie Daniels about stopping his Coumadin  and actually stopped it about 4 weeks ago.  He does note that he has  been short of breath but this is chronic for him.  He denies any chest  pain, he thinks his shortness of breath however may be a little bit  worse.  He has not had any leg swelling.   He had multiple questions today and also did express the possibility  that he is considering a lawsuit against [name] for initially  diagnosing his pulmonary emboli as pneumonia.  I told him this was  difficult situation and that it can sometimes be difficult to discern  between the two given the overlapping clinical scenarios.   CURRENT MEDICATIONS:  1. Aspirin 81.  2. Uroxatral 10 mg.  3. Tricor 145.  4. Crestor 10 mg a day.  5. Nexium 40 mg a day.  6. Fish oil 2400 mg a day.  7. Avodart.   PHYSICAL EXAMINATION:  He is well-appearing, in no acute distress.  He  ambulates around the clinic without any respiratory difficulty.  Blood  pressure is 116/78, heart rate is 83, weight is 199.  HEENT:  Normal.  NECK:  Supple, no JVD, carotids are 2+  bilaterally without any bruits.  There is no lymphadenopathy or thyromegaly.  CARDIAC:  His PMI is nondisplaced.  Regular rate and rhythm.  No  murmurs, rubs or gallops.  No clicks.  LUNGS:  Clear.  ABDOMEN:  Obese, nontender, nondistended.  No obvious  hepatosplenomegaly, no bruits, no masses.  Good bowel sounds.  EXTREMITIES:  Warm with no cyanosis, clubbing or edema.  No cords.  No  rash.  NEURO:  Alert and oriented x3, cranial nerves II-XII are grossly intact.  Moves all fur extremities without difficulty.  Affect is pleasant.   EKG shows normal sinus rhythm with a right bundle branch block which is  new for him compared to previous, he previously had an incomplete right  bundle.  No significant ST-T wave abnormalities.   ASSESSMENT/PLAN:  1. Pulmonary emboli.  We had a long talk about the duration of      Coumadin therapy, I also discussed this with [name].  Given an      unprovoked pulmonary embolus there is some rationale to treat      lifelong, however, more standard it is done for a year to 18      months.  Now that he is off Coumadin I recommend getting a high      sensitivity D-dimer.  If this is elevated we may want to consider      reinitiating.  2. Dyspnea.  I think this is mostly due to deconditioning but he does      have risk factors for coronary disease as well as recurrent      pulmonary emboli.  We will start with a treadmill Myoview to sort      out if it is getting worse, may have to consider a repeat CT scan      if it turns out to be deconditioning he will obviously need to get      involved in an exercise program.  3. Hyperlipidemia, this will be followed by his primary care doctor.  4. History of syncope, this is quiescent.     Justin Buckles. Bensimhon, MD  Electronically Signed    [name]  DD: 04/19/2008  DT: 04/19/2008  Job #: [id number]

## 2010-09-18 NOTE — Assessment & Plan Note (Signed)
[facility]                             PULMONARY OFFICE NOTE   NAME:[name], Justin                     MRN:          [id number]  DATE:11/26/2006                            DOB:          Apr 10, 1933    PROBLEM:  A 77 year old man with recent hospitalization for pneumonia  and failure to improve.   HISTORY:  I was called last night by Dr. Konrad Dolores about this gentleman  who's wife is Dr. Donnetta Hail cousin. She had been concerned about lack of  progress. Approximately 3 weeks or 4 weeks ago he had been walking, had  slipped on wet ground in some way, and injured his toe such that it was  treated with removal of the left great toenail. Some where around there  he temporarily had some pain in the left hip and proximal thigh. He had  some dyspnea, malaise, and at some point some right posterior chest  pain. He was hospitalized at [facility] for 7 days with a  diagnosis of pneumonia, treated initially with Avelox. When a follow up  chest x-ray looked worse, he reports that antibiotic coverage was  changed to Rocephin and Zithromax, which he took for 7 days before being  discharged 7 days ago on Vantin and Zithromax. He had been having some  night sweats off and on through the recent illness, with temperatures in  the 99 to 100 degree range. There had not been much cough and no sputum,  or chills. Last night he had malaise, nausea, some cough, and he said he  nearly vomited. It sounded if maybe the antibiotics had upset his  stomach. He has a history of heartburn and this was somewhat worse but  there was no other chest pain. The left hip and thigh pain has eased but  this week he has been aware of soreness in the left calf. 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 He did get Lovenox DVT prophylaxis at  [facility]. They  tried calling back to [facility] but were not able to  establish follow up. Discharge plan had been for him to return to the  care of his primary physician who has however retired. He has begun the  process of meeting a new primary physician but has not established one  at this time. Dr. Jennette Kettle explained the situation and we were able to get  Mr. 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   MEDICATIONS:  1. Uroxatral 10 mg 1 daily.  2. Atenolol 50 mg 1 daily.  3. Zithromax 500 mg daily.  4. Vantin 200 mg b.i.d.  5. Plavix 75 mg.  6. Colace 100 mg times 2.  7. Tricor 145 mg once daily.  8. Protonix 40 mg daily.  9. Crestor 10 mg daily.  10.Aspirin 81 mg daily.  We are not clear about his pneumococcal vaccine status. There are no  medication allergies known.   REVIEW  OF SYSTEMS:  Heartburn suppressed with Nexium and Tums. He  prefers Protonix. Acute symptoms per HPI. Weight had been stable until  his acute illness during which he has lost about 12 pounds. No bleeding,  high fever, or chills.   PAST MEDICAL HISTORY:  1. Mitral valve prolapse with remote echocardiogram, history of      fainting episodes, and autonomic insufficiency on tilt test, which      confirmed vagal sensitivity. Because of this he takes atropine      for procedures. This was worked up at [employer] [facility] [facility].  2. Palpitations, apparently noted by his primary physicians in the      past and because of which he was put on atenolol.  3. Benign prostatic hypertrophy.  4. Esophageal reflux.  5. Six months ago he had walking pneumonia treated at his primary      office with an antibiotic. At that time he had a CT scan at [facility] showing old damage right lung.   SURGERIES:  Were 3 inguinal hernia repairs.   SOCIAL HISTORY:  Never smoked. He is married living with his wife. Son  is an orthopedic nurse at [employer]. He is a retired Psychologist, occupational now  working as a Counsellor at [employer]. He says  the office he  had been using for several years there was quite damp, such that he had  had to put in a dehumidifier, and it was unpleasant enough he says he  plans not to return to work in that building. This raised some  discussion of whether any of his problems could be related to mold.   FAMILY HISTORY:  Father died at age 78 from complication of  arthrosclerosis including heart failure and renal failure. Mother is  alive at age 54 with diabetes.   OBJECTIVE:  Weight 187 pounds, blood pressure 118/74, pulse 74, room air  saturation 97%. He is dyspneic, speaking short sentences, trim,  intelligent.  SKIN: I do not find rash. Adenopathy non obvious at the supraclavicular  areas or axially.  HEENT: Nasal airway and pharynx clear. No strider. No neck vein  distension or thyromegaly.  CHEST: Breath sounds are quiet. I think I may be hearing a faint crackle  at the right posterior base otherwise I do not hear rales, rhonchi,  wheeze, or rub.  HEART: Regular rhythm, P2 is not increased. I do not hear rub, murmur,  or gallop.  ABDOMEN: Soft without hepatosplenomegaly.  EXTREMITIES: Tenderness left calf, positive Homan's. No edema or  cyanosis. He has had surgical removal of left great toenail.   RADIOLOGY:  Chest x-ray is available without report at this time,  showing opacity in the superior segment of the right lower lobe and also  projecting over the right mid lung zone. Nonspecific appearance. This  could represent a resolving pneumonia. Right hemidiaphragm is somewhat  elevated but I do not appreciate an effusion.   IMPRESSION:  Recent treatment for pneumonia. This may a partly treated  pneumonia, including possibly an atypical organism. Lack of high fever,  purulent discharge, definite organism on culture, or clear response to  antibiotics raised concern. I have explained that pneumonia can persist  and be slow to resolve. So that may still be the right diagnosis. I am   concerned about the calf pain in association with dyspnea and feel that  it is important that we exclude a pulmonary embolism, although there is  no family or personal  history.   PLAN:  1. CT scan of the chest with contrast to rule out pulmonary embolism.  2. Blood for CBC, basic metabolic panel, protime, and PTT.  3. RAST allergy panel to begin addressing his concern about mold      problem related to his office work place.  4. Pending results of CT scan and labs, he will continue his current      antibiotics to completion and schedule return in 3 weeks, earlier      p.r.n.     Clinton D. Maple [name], MD, Tonny Bollman, [name]  Electronically Signed    CDY/MedQ  DD: 11/26/2006  DT: 11/27/2006  Job #: [id number]

## 2010-09-18 NOTE — Assessment & Plan Note (Signed)
[facility]                             PULMONARY OFFICE NOTE   NAME:CAMPBELLOstin, Mathey                     MRN:          [id number]  DATE:12/12/2006                            DOB:          [birth year]    PROBLEM LIST:  1. Pulmonary embolism with left leg DVT.  2. Previous diagnosis of mitral valve prolapse, not recently      confirmed.  3. Benign prostatic hypertrophy.  4. Esophageal reflux.   HISTORY:  This gentleman was hospitalized first at [facility] with  diagnosis of right lung pneumonia and subsequently at [facility] with recognition of extensive left leg vein DVT and bilateral  pulmonary emboli.  A removable vena cava filter was placed because of  clot burden and he was discharged on Coumadin for followup through the  [facility].  INR today was 2.2.  Continuing Coumadin 5 mg.  He is scheduled for a visit next week but comes now reporting recurrent  night sweats on 2-3 occasions in the last week or so, sufficient to get  the sheets wet.  He has been taking aspirin 81 mg at 10 p.m. recently.  He has had no sudden events, no discomfort, no cough, no leg pain.  He  elevates his legs when sitting or in a car.  He is looking into getting  elastic hose as discussed.  There has been no bleeding.   MEDICATIONS:  1. Aspirin 81 mg.  2. Uroxatral 10 mg.  3. TriCor 145 mg.  4. Coumadin 5 mg.  5. Crestor 10 mg.  6. Nexium 40 mg.   No medication allergy.   OBJECTIVE:  VITAL SIGNS:  Weight 184 pounds, temperature 97.9, BP  100/76, pulse regular 103, room air saturation 97%.  GENERAL:  He looks relaxed and comfortable.  MUSCULOSKELETAL:  He is wearing sandals without elastic hose.  He has no  leg edema.  I do not find adenopathy.  NECK:  There is no neck vein distention or stridor.  HEART:  Sounds are regular.  V2 is not increased.   IMPRESSION:  Deep vein thrombosis with pulmonary embolism.   I am not sure why he is having  night sweats.  It may be too easy to  blame nighttime aspirin but he is going to try changing that dose to  morning.  I have discussed sweating as a symptom.  Hopefully, it no  longer reflects active phlebitis.   PLAN:  1. Continue efforts to avoid stagnation of blood flow.  2. Blood today for CBC and a sedimentation rate.  3. Change aspirin to take in the morning.  4. Keep appointment for next week as planned.  5. He will continue with his Coumadin.  6. We plan to repeat Doppler evaluation of the left leg veins and if      that shows good stabilization, then we will have radiology remove      his filter.  7. He is working to establish with a new primary physician close to      his home and he will continue pulmonary followup here and  cardiology followup with Dr. Milas Kocher.     Clinton D. Maple [name], MD, Tonny Bollman, [name]  Electronically Signed    CDY/MedQ  DD: 12/12/2006  DT: 12/12/2006  Job #: [id number]

## 2010-09-18 NOTE — Assessment & Plan Note (Signed)
[facility]                                 ON-CALL NOTE   NAME:CAMPBELLLuian, Schumpert                  MRN:          [id number]  DATE:12/11/2006                            DOB:          Nov 10, 1932    Mr. Novello called me this evening to report that he had been having  some night sweats.  The patient was recently discharged from the  hospital on July 31st with pulmonary embolus and had been started on  Coumadin 5 mg daily along with aspirin, Crestor, Protonix, TriCor,  Colace and Uroxatral.  The patient has no complaints of chest pain,  shortness of breath, dizziness, nausea or vomiting but he states over  the last couple of nights he has had some sweatiness at night that goes  away after he gets up and goes to the bathroom and cools off.  He states  that he did not know that if it meant anything that he wanted to pass it  on.  The patient does have a followup appointment in the morning with  the [facility]in Clinic along with a followup appointment with Dr. Gala Romney  on the 14th of August and a followup appointment with his primary care  physician next week as well.   I have advised the patient to pass this on to the Coumadin Clinic  nurses.  Although I do not believe this is indicative of anything, they  will talk with him more.  I asked him if he was having a fever or chills  in between these episodes of sweating and he said he did not.  This  should be reported to the Coumadin Clinic nurses and also I advised him  to report it to his primary care physician on the next appointment.  Patient verbalized understanding.  I have made no changes in his  medication.     Bettey Mare. Lyman Bishop, NP  Electronically Signed    KML/MedQ  DD: 12/11/2006  DT: 12/12/2006  Job #: [id number]

## 2010-09-18 NOTE — Discharge Summary (Signed)
[name], [name]            ACCOUNT NO.:  [id number]   MEDICAL RECORD NO.:  [id number]          PATIENT TYPE:  INP   LOCATION:  3730                         FACILITY:  [facility]   PHYSICIAN:  Clinton D. Maple [name], MD, [name], FACPDATE OF BIRTH:  10-24-34   DATE OF ADMISSION:  11/26/2006  DATE OF DISCHARGE:  12/04/2006                               DISCHARGE SUMMARY   DISCHARGE DIAGNOSES:  1. Acute/subacute bilateral pulmonary emboli.  2. Left lower extremity deep venous thrombosis.  3. 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  4. Status post inferior vena cava filter placement on November 27, 2006.  5. History of diastolic dysfunction and questionable history of mitral      valve prolapse.   LABORATORY DATA:  Date, December 04, 2006, hemoglobin 15.3, hematocrit 45.5,  white blood cell count 7.6, platelet count 480.  Date, December 02, 2006,  sodium 135, potassium 4.5, chloride 101, CO2 25, BUN 16, creatinine  1.55, glucose 111.  Date, December 04, 2006, INR 2.5.  Date, December 03, 2006,  INR 2.5.   BRIEF HISTORY:  A 77 year old nonsmoker in good general health  previously.  Reported 3 weeks prior to admission, he was walking on a  hill, hit his toe, and ended up needing his left great toenail removed.  He was treated with Keflex for this.  He soon began to note pain in his  left hip consistent with bursitis by description.  He later became short  of breath and was admitted to [facility] about 2 weeks ago with a  diagnosis of pneumonia.  He did not have much in the way of cough, no  fever, chills, or significant sputum production.  He was initially  treated with Avelox.  (This was later changed to Rocephin and Zithromax  when, per family, chest x-ray got worse).  He was discharged on 1 week  of intravenous therapy and took 7 days of Zithromax and Vantin.  He  remained short of breath and weak with some transient posterior right-  sided chest pain.  He became clinically worse, and therefore  his wife  called his primary care physician.  He was seen by Dr. Maple [name] as a work  in visit on the morning of the 23rd.  In the pulmonary office, he gave a  history of significant left calf pain, which was tender to the touch  with a positive Homan's sign as well as pleuritic-type chest pain.  He  underwent spiral CT which was done in the afternoon at [facility] which reported positive bilateral pulmonary emboli with question of  pulmonary infarct on the right.  He was given a loading dose of 1000  units of heparin and transferred to [facility].  A duplex  Doppler of the left main was completed and demonstrated extensive clot  throughout the left vascular system of the calf and left femoral vein.  He was admitted initially to the intensive care for further evaluation  and therapy.   HOSPITAL COURSE BY DISCHARGE DIAGNOSES:  1. Acute/subacute bilateral pulmonary emboli with left venous      thromboembolism and  right lower lobe pulmonary infarct.  Mr.      Bannan was admitted to the medical intensive care for close      observation after initial presentation.  Therapy consisted of IV      heparin, echocardiogram to evaluate hemodynamic status in the      setting of acute pulmonary emboli and supplemental oxygen.  The      echocardiogram was obtained in the intensive care that demonstrated      overall left ventricular systolic function to be normal.  There was      question of hypokinesis of the inferior and lateral walls.  The RV      was difficult to evaluate due to poor parasternal and apical views,      but function appeared to be mildly impaired.  The right ventricle      was mildly dilated.  He had no hemodynamic compromise and remained      hemodynamically stable.  He was started on Coumadin on day of      admission with 48 hours of overlapping therapy for Coumadin and      heparin after evaluating therapeutic INR.  Heparin has since been      discontinued.  He  was discharged to home on daily Coumadin which      will be managed initially by the [facility] and then      eventually by his primary care physician, Dr. Vito Backers.  A      followup venous ultrasound was obtained on December 02, 2006, which      continued to demonstrate continued evidence of DVT in the mid      femoral, popliteal vein with some improvement in the posterior      tibial vein.  There was no superficial thrombus noted.  As      previously noted, he did undergo IVC filter given his significant      deep vein thrombosis on the 24th.  He will be, again, discharged to      home on Coumadin therapy with 2 followup by Dr. Fannie Knee.  Upon      time of discharge, he has been liberated from oxygen support, able      to tolerate activity without difficulty, and feels markedly      improved.  Upon time of followup will decide on further follow up      on deep vein thrombosis as well as potential to remove extractable      IVC filter.  2. History of mitral valve prolapse.  This was not validated on      echocardiogram.  There have been no cardiac sequelae during this      hospitalization.  [name] will follow up with [facility]      [facility]iology, Dr. Arvilla Meres, in 1-2 months following      discharge.  The [facility] Cardiology service will contact Mr.      Orvan Falconer and arrange this appointment.  I verified this with their      clinic.  3. Hypercholesterolemia.  The plan for this is to continue on current      regimen.  4. Gastroesophageal reflux disease.  The plan for this is to continue      on normal regimen.   DISCHARGE INSTRUCTIONS:  1. Uroxatral 10 mg 1 p.o. daily.  2. Colace 100 mg 2 caps p.r.n.  3. Tricor 145 mg cap 1 daily each p.m.  4. Protonix 40 mg  daily.  5. Crestor 10 mg daily at nighttime.  6. Aspirin 81 mg daily.  7. Warfarin 5 mg daily.   He has followup at [facility] on December 05, 2006, at 9:30  a.m.  His goal INR will be 2-3x  normal reference dosing.  Initially,  this will be managed at the [facility], with hopes to  eventually transition management to primary care service.   FOLLOW UP AS FOLLOWS:  1. [facility] December 05, 2006, at 9:30 a.m.  2. Dr. Fannie Knee in 2 weeks.  Our office will arrange this      appointment.  3. [facility] with Dr. Arvilla Meres, [facility]      Clinic to arrange this appointment in 1-2 months.  4. Dr. Vito Backers.      Zenia Resides, NP      Clinton D. Maple [name], MD, [facility], [name]  Electronically Signed    PB/MEDQ  D:  12/04/2006  T:  12/04/2006  Job:  [id number]   cc:   [place], [place] [address], phone number [phone] Dr. Vito Backers,  [address]  Clinton D. Maple [name], MD, [name], [name]  Daniel R. Bensimhon, MD

## 2010-09-18 NOTE — Assessment & Plan Note (Signed)
[facility]                            CARDIOLOGY OFFICE NOTE   DIO, GILLER                     MRN:          [id number]  DATE:02/03/2007                            DOB:          05/10/1932    PRIMARY CARE PHYSICIAN:  Ruthann Cancer, MD at [facility]   PULMONOLOGIST:  Rennis Chris. Maple [name], MD, [name], [name]   INTERVAL HISTORY:  Mr. [name] is a very pleasant 77 year old male with  a history of reported mitral valve prolapse. He was recently admitted to  [facility] in July with pulmonary emboli and DVT. He was placed on  Coumadin. He returns today to establish longterm cardiac care.   He denies any history of known coronary disease. He has never had a  cardiac catheterization, but has had multiple screening stress tests,  most recently in January 2008, which he says were normal. He also has a  history of hyperlipidemia and recurrent syncope secondary to severe  vasovagal syndrome with a positive tilt test.   He currently says he is doing okay. He does have dyspnea on exertion,  but he says that he really has not pushed himself much. He denies any  orthopnea, PND. No lower extremity edema.   CURRENT MEDICATIONS:  1. Aspirin 81.  2. Uroxatral 10 mg a day.  3. Tricor 145.  4. Coumadin 5.  5. Crestor 10.  6. Nexium 40.  7. Fish oil.   PHYSICAL EXAMINATION:  He is well-appearing in no acute distress. Blood  pressure is 112/64, heart rate 64. Weight is 192. He ambulates around  the clinic without any respiratory difficulty.  HEENT: Is normal.  NECK: is supple. No JVD. Carotids are 2+ bilaterally without bruits.  There is no lymphadenopathy or thyromegaly.  CARDIAC: PMI is nondisplaced. He has regular rate and a rhythm with an  S4. No murmurs.  LUNGS:  Are clear.  ABDOMEN: Soft, nontender, nondistended. No hepatosplenomegaly. No  bruits. No masses. Good bowel sounds.  EXTREMITIES: Are warm with no cyanosis, clubbing or edema. No cords and  no rash.  NEURO: Alert and oriented x3. Cranial nerves II-XII are intact. Moves  all 4 extremities without difficulty. Affect is pleasant.   EKG shows normal sinus rhythm at a rate of 64. No ST-T wave  abnormalities. Normal axis.   ASSESSMENT/PLAN:  1. Mitral valve prolapse. Will get an echocardiogram to reevaluate and      establish a baseline.  2. Screening for cardiovascular disease. We will get a copy of his      most recent stress test from Dr.  Laurelyn Sickle, his previous cardiologist at      [facility].  3. Dyspnea with previous pulmonary emboli. He has recently undergone a      followup CT scan to look for resolution of his clots. I told him he      can start with an exercise program and build up gradually to      walking 45 minutes to one hour a day.  4. Hyperlipidemia. Followed by Dr.  Sanda Linger.   DISPOSITION:  He will return to clinic in six months  for routine  followup. We will get an echocardiogram as noted above.     Bevelyn Buckles. Bensimhon, MD  Electronically Signed    DRB/MedQ  DD: 02/03/2007  DT: 02/03/2007  Job #: [id number]   cc:   Ruthann Cancer, MD

## 2010-09-18 NOTE — Assessment & Plan Note (Signed)
[facility]                             PULMONARY OFFICE NOTE   NAME:CAMPBELLTadd, Holtmeyer                     MRN:          [id number]  DATE:12/18/2006                            DOB:          Apr 30, 1933    PROBLEM LIST:  1. Pulmonary embolism with left leg deep vein thrombosis on Coumadin      with umbrella filter.  2. Previous diagnosis of mitral valve prolapse, not recently confirmed      on echocardiogram.  3. Benign prostatic hypertrophy.  4. Esophageal reflux.   HISTORY:  Since hospital discharge from [facility] on July  31st, he has been feeling gradually stronger.  His new physician checked  his INR yesterday and will take over management of his Coumadin.  He  thought he felt a little funny in the left thigh.  The sensation was  fleeting and not associated with any chest discomfort or other changes.  He denies chest pain, bleeding, cough, or sputum.  When he was here on  August 8th, he had been having some night sweats which apparently have  settled back down.   MEDICATIONS:  1. Aspirin 81 mg.  2. Uroxatral 10 mg.  3. TriCor 145 mg.  4. Coumadin 5 mg.  5. Crestor 10 mg.  6. Nexium 40 mg.  7. Fish oil.   No medication allergy.   OBJECTIVE:  VITAL SIGNS:  Weight 184 pounds, BP 124/68, pulse regular  89, room air saturation 98%.  GENERAL:  He looks comfortable.  LUNGS:  Fields sound clear.  I do not hear rales, rub, or rhonchi.  Work-  of-breathing is not increased.  There is no neck vein distention.  No  stridor.  HEART:  Sounds are regular.  I do not hear a murmur.  P2 is not  increased.  EXTREMITIES:  He is now wearing calf length support hose.   IMPRESSION:  1. Deep vein thrombosis with pulmonary embolism.  2. Coumadin therapy to be managed by Dr. Sanda Linger.  Given extensive clot      burden, I would anticipate at least 13-month therapy.  It is time to      repeat a Doppler of his leg veins.  The original study had shown a     mobile clot and extensive clotting throughout the left venous      system.  If that has substantially improved without evidence of      mobile clot is gone, we can talk with radiology about removal of      the temporary umbrella filter.  3. We have suggested gradual progression of exercise as tolerated.  4. He had originally had a CT in [place], which had      questioned some nodules.  I suggest a followup chest CT to be done      at the same facility for comparison.  This could perhaps wait a      couple of months to allow further clearing of the changes from his      pulmonary embolism but should not be deferred indefinitely.   PLAN:  1.  We are ordering a comparison Doppler leg vein evaluation to be done      here for comparison with original.  2. Schedule return 1 month, earlier p.r.n.     Clinton D. Maple [name], MD, [facility], [name]  Electronically Signed    CDY/MedQ  DD: 12/20/2006  DT: 12/21/2006  Job #: [id number]   cc:   Primus Bravo, Dr.

## 2010-10-24 ENCOUNTER — Encounter: Payer: Self-pay | Admitting: Internal Medicine

## 2010-12-04 ENCOUNTER — Encounter: Payer: Self-pay | Admitting: Internal Medicine

## 2010-12-12 ENCOUNTER — Ambulatory Visit (INDEPENDENT_AMBULATORY_CARE_PROVIDER_SITE_OTHER): Payer: Medicare Other | Admitting: Internal Medicine

## 2010-12-12 ENCOUNTER — Encounter: Payer: Self-pay | Admitting: Internal Medicine

## 2010-12-12 VITALS — BP 112/74 | HR 63 | Ht 72.0 in | Wt 191.0 lb

## 2010-12-12 DIAGNOSIS — E785 Hyperlipidemia, unspecified: Secondary | ICD-10-CM | POA: Insufficient documentation

## 2010-12-12 DIAGNOSIS — I8390 Asymptomatic varicose veins of unspecified lower extremity: Secondary | ICD-10-CM | POA: Insufficient documentation

## 2010-12-12 DIAGNOSIS — I2699 Other pulmonary embolism without acute cor pulmonale: Secondary | ICD-10-CM

## 2010-12-12 NOTE — Assessment & Plan Note (Signed)
Continue compression stockings. Can refer to vein clinic as needed.

## 2010-12-12 NOTE — Assessment & Plan Note (Signed)
Followed by PCP. Would continue Crestor. Advised him to double fish oil and increasing exercise.

## 2010-12-12 NOTE — Assessment & Plan Note (Signed)
Given an unprovoked pulmonary embolus there is some rationale to treat lifelong, however, more standard it is done for a year to 18 months. As he had a life-threatening PE in past and has been doing well on coumadin we have decided to continue lifelong therapy.

## 2010-12-12 NOTE — Progress Notes (Signed)
PCP: Dr. Tedra Senegal [place])  HPI:  [name] is a very pleasant 77 year old male with a history of unprovoked pulmonary emboli diagnosed in July of 2008. Remains on coumadin. He does not have any known history of coronary artery disease.  He had a normal stress echocardiogram in September of 2007 with his previous cardiologist.  He also has a history of hyperlipidemia and recurrent syncope secondary to vasovagal syndrome with a positive tilt test.  Had an episode of CP in 3/10 and saw Dr. Ladona Ridgel for unschedule visit. Had GXT which was normal.   Returns for yearly f/u. Doing well. Fairly active without too much problem. Walks 1 mile every day without problem. Jogs for a little bit at a time. Gets winded if he pushes really hard. Mild bruising with coumadin.   PCP following cholesterol which he says is good except for low HDL. Tried Niaspan but can't tolerate. Takes 2 fish oil/day.   ROS: All systems negative except as listed in HPI, PMH and Problem List.  Past Medical History  Diagnosis Date  . Pulmonary embolism   . DVT (deep venous thrombosis)   . Lung nodule   . GERD (gastroesophageal reflux disease)   . BPH (benign prostatic hyperplasia)   . Hyperlipidemia   . Syncope   . Chest pain     Current Outpatient Prescriptions  Medication Sig Dispense Refill  . alfuzosin (UROXATRAL) 10 MG 24 hr tablet Take 10 mg by mouth daily.        [name] aspirin 81 MG tablet Take 81 mg by mouth daily.        [name] dutasteride (AVODART) 0.5 MG capsule Take 0.5 mg by mouth daily.        . ergocalciferol (VITAMIN D2) 50000 UNITS capsule Take by mouth 2 (two) times a week. 2 capsules       . esomeprazole (NEXIUM) 40 MG capsule Take 40 mg by mouth daily.        . fenofibrate (TRICOR) 145 MG tablet Take 145 mg by mouth daily. Pt taking when remebered      . lubiprostone (AMITIZA) 8 MCG capsule Take 8 mcg by mouth as needed.       . Omega-3 Fatty Acids (FISH OIL) 1000 MG CAPS Take 1 capsule by mouth daily.          . rosuvastatin (CRESTOR) 10 MG tablet Take 10 mg by mouth daily.        [name] warfarin (COUMADIN) 5 MG tablet Use as directed          PHYSICAL EXAM: Filed Vitals:   12/12/10 1035  BP: 112/74  Pulse: 63   General:  Well appearing. No resp difficulty HEENT: normal Neck: supple. JVP flat. Carotids 2+ bilaterally; no bruits. No lymphadenopathy or thryomegaly appreciated. Cor: PMI normal. Regular rate & rhythm. No rubs, gallops or murmurs. Lungs: clear Abdomen: soft, nontender, nondistended. No hepatosplenomegaly. No bruits or masses. Good bowel sounds. Extremities: no cyanosis, clubbing, rash, edema. +varicose veins Neuro: alert & orientedx3, cranial nerves grossly intact. Moves all 4 extremities w/o difficulty. Affect pleasant.   ECG: NSR 63 RBBB No ST-T wave abnormalities.   ASSESSMENT & PLAN:

## 2010-12-12 NOTE — Patient Instructions (Signed)
Your physician wants you to follow-up in: 1 year. You will receive a reminder letter in the mail two months in advance. If you don't receive a letter, please call our office to schedule the follow-up appointment.  

## 2011-02-15 LAB — PROTIME-INR
INR: 2.1 — ABNORMAL HIGH
Prothrombin Time: 24.5 — ABNORMAL HIGH

## 2011-02-15 LAB — CBC
MCHC: 34.1
RBC: 4.27

## 2011-02-18 LAB — BASIC METABOLIC PANEL
BUN: 15
CO2: 25
Calcium: 8.7
Calcium: 9.4
Creatinine, Ser: 1.43
Creatinine, Ser: 1.49
Creatinine, Ser: 1.55 — ABNORMAL HIGH
GFR calc Af Amer: 54 — ABNORMAL LOW
GFR calc Af Amer: 56 — ABNORMAL LOW
GFR calc Af Amer: 59 — ABNORMAL LOW
GFR calc non Af Amer: 44 — ABNORMAL LOW
GFR calc non Af Amer: 46 — ABNORMAL LOW
GFR calc non Af Amer: 49 — ABNORMAL LOW
Potassium: 4.2
Sodium: 135

## 2011-02-18 LAB — CBC
HCT: 38.7 — ABNORMAL LOW
Hemoglobin: 12.3 — ABNORMAL LOW
MCHC: 33.2
MCHC: 33.6
MCHC: 33.7
MCV: 95.5
MCV: 97.1
Platelets: 480 — ABNORMAL HIGH
Platelets: 486 — ABNORMAL HIGH
Platelets: 520 — ABNORMAL HIGH
Platelets: 526 — ABNORMAL HIGH
Platelets: 533 — ABNORMAL HIGH
Platelets: 551 — ABNORMAL HIGH
RBC: 3.77 — ABNORMAL LOW
RBC: 3.82 — ABNORMAL LOW
RBC: 4.02 — ABNORMAL LOW
RBC: 4.13 — ABNORMAL LOW
RBC: 4.38
RDW: 12.8
WBC: 7
WBC: 7.3
WBC: 7.6
WBC: 7.7
WBC: 8.5
WBC: 9.1
WBC: 9.1

## 2011-02-18 LAB — HEPARIN LEVEL (UNFRACTIONATED)
Heparin Unfractionated: 0.37
Heparin Unfractionated: 0.37
Heparin Unfractionated: 0.47
Heparin Unfractionated: 0.58
Heparin Unfractionated: 0.61
Heparin Unfractionated: 0.62
Heparin Unfractionated: 0.64
Heparin Unfractionated: 0.65
Heparin Unfractionated: 0.86 — ABNORMAL HIGH

## 2011-02-18 LAB — PROTIME-INR
INR: 1.2
INR: 1.3
INR: 1.4
INR: 1.6 — ABNORMAL HIGH
Prothrombin Time: 16.2 — ABNORMAL HIGH
Prothrombin Time: 16.5 — ABNORMAL HIGH
Prothrombin Time: 19.8 — ABNORMAL HIGH
Prothrombin Time: 28.6 — ABNORMAL HIGH
Prothrombin Time: 29.4 — ABNORMAL HIGH

## 2011-02-18 LAB — LACTATE DEHYDROGENASE: LDH: 144

## 2011-02-18 LAB — LUPUS ANTICOAGULANT PANEL
DRVVT: 48.2 — ABNORMAL HIGH (ref 36.1–47.0)
Lupus Anticoagulant: NOT DETECTED
PTT Lupus Anticoagulant: 155.8 — ABNORMAL HIGH (ref 36.3–48.8)
PTTLA 4:1 Mix: 117.8 — ABNORMAL HIGH (ref 36.3–48.8)
PTTLA Confirmation: 9.8 — ABNORMAL HIGH (ref <8.0)

## 2011-02-18 LAB — COMPREHENSIVE METABOLIC PANEL
Albumin: 2.4 — ABNORMAL LOW
Alkaline Phosphatase: 40
BUN: 17
Chloride: 102
Creatinine, Ser: 1.47
Glucose, Bld: 122 — ABNORMAL HIGH
Potassium: 4.6
Total Bilirubin: 0.6

## 2011-02-18 LAB — LIPID PANEL
Cholesterol: 84
LDL Cholesterol: 47
Total CHOL/HDL Ratio: 4.7

## 2011-02-18 LAB — FREE PSA: PSA, Free: 0.4

## 2011-02-18 LAB — HOMOCYSTEINE: Homocysteine: 15.4

## 2011-02-18 LAB — APTT: aPTT: 128 — ABNORMAL HIGH

## 2011-04-08 ENCOUNTER — Telehealth: Payer: Self-pay | Admitting: Internal Medicine

## 2011-04-08 NOTE — Telephone Encounter (Signed)
Spoke with pt, will forward to dr bensimhon for referral to whom he would like pt to see. The pt c/o arm aching for the last 2 weeks. He has seen his primary and is currently in his office. He has been under the care of the primary md for the last 2 weeks with no relief. He will call after the appt with the primary if needs to be seen. [name]

## 2011-04-08 NOTE — Telephone Encounter (Signed)
Pt calling to see which dr Dr Gala Romney would prefer him to see here?

## 2011-04-08 NOTE — Telephone Encounter (Signed)
i am ok with him seeing anyone in the practice. Schedule for next available after pcp sees. thanks

## 2011-04-09 NOTE — Telephone Encounter (Signed)
Pt was notified and was transferred to the appt desk to make an appt.  He states his pcp felt the pain was coming from his neck.

## 2011-05-15 ENCOUNTER — Encounter: Payer: Medicare Other | Admitting: Cardiovascular Disease

## 2011-07-05 ENCOUNTER — Telehealth (HOSPITAL_COMMUNITY): Payer: Self-pay | Admitting: *Deleted

## 2011-07-05 ENCOUNTER — Telehealth: Payer: Self-pay

## 2011-07-05 NOTE — Telephone Encounter (Signed)
I was notified by Laurence Slate that [name] had been referred to our clinic by the [facility] regarding this patient's request to have an INR checked.  I phoned the patient as a courtesy, as he had already been told that we were not the appropriate provider to call for his coumadin dosing.  [name] has not been seen by Dr. Gala Romney since 08/12 and has been followed by [place] PCP [place], Dr. Tedra Senegal for management of his Coumadin.  [name] stated that he is followed by Dr. Tedra Senegal and earlier this week was started on an antibiotic for an URI.  Dr. Tedra Senegal instructed [name] to return today to check his INR, as his antibiotic might affect his levels.  [name] relates that he arrived at Dr. Parke Simmers office after closing and missed his scheduled appointment.  He then went to the [facility] to have his INR checked, where they refused without an order.  Because he was not able to reach his PCP, he phoned the [facility] for an order.  They refused and instructed him to contact the HF clinic.  I told [name] that we did not routinely manage his coumadin dosing and reminded him that we had not seen him since 08/12 and were not privy to his current state of health.  He stated that he only needed the lab work and he could figure out how to dose his coumadin himself.  I responded that that was inappropriate and that he must have medical supervision for all medication dosing.  He grew quite upset and said that he wanted to be seen today.  I asked him several times to call the PCP on call for Dr. Tedra Senegal, which would be appropriate to issue the lab order.  He became increasingly upset and stated "ok, if I die of a blood clot tonight, you will be sued".  I explained that he could drive here and be evaluated in our E.D. Or call the physician on call and that I was not refusing to see him, but could not issue an order for the labs.  He  stated, "I only need the lab work, I'll take care of the rest".  He finished by saying again, that if something happened before he could obtain his lab work, it would be our fault.

## 2011-07-05 NOTE — Telephone Encounter (Signed)
Error

## 2011-07-29 ENCOUNTER — Telehealth (INDEPENDENT_AMBULATORY_CARE_PROVIDER_SITE_OTHER): Payer: Self-pay | Admitting: Surgery

## 2011-08-01 ENCOUNTER — Ambulatory Visit (INDEPENDENT_AMBULATORY_CARE_PROVIDER_SITE_OTHER): Payer: Medicare Other | Admitting: Surgery

## 2011-08-01 ENCOUNTER — Encounter (INDEPENDENT_AMBULATORY_CARE_PROVIDER_SITE_OTHER): Payer: Self-pay | Admitting: Surgery

## 2011-08-01 VITALS — BP 120/72 | HR 80 | Temp 97.7°F | Resp 16 | Ht 71.0 in | Wt 187.0 lb

## 2011-08-01 DIAGNOSIS — M6208 Separation of muscle (nontraumatic), other site: Secondary | ICD-10-CM | POA: Insufficient documentation

## 2011-08-01 DIAGNOSIS — M6200 Separation of muscle (nontraumatic), unspecified site: Secondary | ICD-10-CM

## 2011-08-01 DIAGNOSIS — R1031 Right lower quadrant pain: Secondary | ICD-10-CM

## 2011-08-01 NOTE — Patient Instructions (Signed)
If you develop increased pain or a significant bulge, please contact office for follow up. tmg

## 2011-08-01 NOTE — Progress Notes (Signed)
Chief Complaint  Patient presents with  . New Evaluation    hernia - referral by Dr. Konrad Dolores    HISTORY: Patient is a pleasant 78 year old white male with a history of previous hernia repairs. Patient also has a complex past medical history including chronic anticoagulation for history of pulmonary embolism. He is also followed closely by cardiology due to significant vasovagal events.  Approximately 2 months ago the patient noted discomfort in the right groin. He had undergone laparoscopic right inguinal hernia repair in 2002 at [facility]. Patient experienced a recurrence and underwent open repair in 2005. Patient noted discomfort above the level of his right groin incision. He was evaluated by his primary care physician. Patient was referred to our office for evaluation for possible recurrent hernia.  Patient also notes a history of a bulge in the upper mid abdomen. Patient has undergone previous left inguinal hernia repair in 1973 and has had no further symptoms.  Past Medical History  Diagnosis Date  . Pulmonary embolism   . DVT (deep venous thrombosis)   . Lung nodule   . GERD (gastroesophageal reflux disease)   . BPH (benign prostatic hyperplasia)   . Hyperlipidemia   . Syncope   . Chest pain   . Hernia      Current Outpatient Prescriptions  Medication Sig Dispense Refill  . alfuzosin (UROXATRAL) 10 MG 24 hr tablet Take 10 mg by mouth daily.        [name] aspirin 81 MG tablet Take 81 mg by mouth daily.        . ergocalciferol (VITAMIN D2) 50000 UNITS capsule Take by mouth 2 (two) times a week. 2 capsules       . esomeprazole (NEXIUM) 40 MG capsule Take 40 mg by mouth daily.        . fenofibrate (TRICOR) 145 MG tablet Take 145 mg by mouth daily. Pt taking when remebered      . lubiprostone (AMITIZA) 8 MCG capsule Take 8 mcg by mouth as needed.       . Omega-3 Fatty Acids (FISH OIL) 1000 MG CAPS Take 1 capsule by mouth daily.        . rosuvastatin (CRESTOR) 10 MG tablet  Take 10 mg by mouth daily.        . vitamin B-12 (CYANOCOBALAMIN) 100 MCG tablet Take 50 mcg by mouth daily.      [name] warfarin (COUMADIN) 5 MG tablet Use as directed       . dutasteride (AVODART) 0.5 MG capsule Take 0.5 mg by mouth daily.           Allergies  Allergen Reactions  . Multiple Vitamin     REACTION: intol     Family History  Problem Relation Age of Onset  . Heart failure Father   . Cancer Mother     of eye-causing her to lose her eye     History   Social History  . Marital Status: Married    Spouse Name: N/A    Number of Children: 2  . Years of Education: N/A   Occupational History  . Horticulturist, commercial for furniture store    Social History Main Topics  . Smoking status: Never Smoker   . Smokeless tobacco: Never Used  . Alcohol Use: No  . Drug Use: No  . Sexually Active: None   Other Topics Concern  . None   Social History Narrative  . None     REVIEW OF SYSTEMS - PERTINENT  POSITIVES ONLY: Right groin pain, intermittent, over past 2 months. No visible bulge. No sign or symptom of intestinal obstruction.  EXAM: Filed Vitals:   08/01/11 0904  BP: 120/72  Pulse: 80  Temp: 97.7 F (36.5 C)  Resp: 16    HEENT: normocephalic; pupils equal and reactive; sclerae clear; dentition good; mucous membranes moist NECK:  symmetric on extension; no palpable anterior or posterior cervical lymphadenopathy; no supraclavicular masses; no tenderness CHEST: clear to auscultation bilaterally without rales, rhonchi, or wheezes CARDIAC: regular rate and rhythm without significant murmur; peripheral pulses are full ABDOMEN: soft without distension; bowel sounds present; no mass; no hepatosplenomegaly; no hernia; Well-healed incisions below the umbilicus and in the groin bilaterally; rectus diastases with sit up maneuver and Valsalva; no testicular masses. Palpation in the left inguinal canal with cough and Valsalva and shows no sign of recurrence. Palpation in the  right inguinal canal with cough and Valsalva shows no sign of recurrence. There is a small bulge above the level of the right inguinal hernia repair. This represents some laxity in the abdominal wall but there is not a fascial defect. EXT:  non-tender without edema; no deformity NEURO: no gross focal deficits; no sign of tremor   LABORATORY RESULTS: See [facility] (CHL-Epic) for most recent results   RADIOLOGY RESULTS: See [facility] (CHL-Epic) for most recent results   IMPRESSION: #1 mild laxity abdominal wall right lower quadrant, no evidence of recurrent hernia #2 rectus diastases, mild #3 history of pulmonary embolism on chronic anticoagulation  PLAN: The patient and I reviewed all of the above issues and findings. I have reassured him that he does not require surgical intervention at this point in time. The laxity of the right lower quadrant abdominal wall may be a consequence of his 2 prior surgeries. I did not find a fascial defect and did not think he require surgical intervention at this point in time. The rectus diastases is mainly cosmetic and does not require surgical intervention.  Patient is reassured. He will return to see me in this office as needed.  Velora Heckler, MD, FACS General & Endocrine Surgery [facility]   Visit Diagnoses: 1. Rectus diastasis, mild   2. Right groin pain     Primary Care Physician: Leandro Reasoner, MD, MD  Cardiology:  Dr. Arvilla Meres, [facility]

## 2011-09-02 ENCOUNTER — Encounter: Payer: Self-pay | Admitting: Internal Medicine

## 2011-09-02 ENCOUNTER — Ambulatory Visit (INDEPENDENT_AMBULATORY_CARE_PROVIDER_SITE_OTHER): Payer: Medicare Other | Admitting: Internal Medicine

## 2011-09-02 VITALS — BP 118/74 | HR 67 | Ht 72.0 in | Wt 191.0 lb

## 2011-09-02 DIAGNOSIS — I2699 Other pulmonary embolism without acute cor pulmonale: Secondary | ICD-10-CM

## 2011-09-02 DIAGNOSIS — I82409 Acute embolism and thrombosis of unspecified deep veins of unspecified lower extremity: Secondary | ICD-10-CM

## 2011-09-02 NOTE — Patient Instructions (Signed)
Continuing coumadin as discussed, as long as you don't have bleeding problems. Your cardiologist might prefer to change you to a different anticoagulant.

## 2011-09-02 NOTE — Progress Notes (Signed)
Patient ID: [name], male    DOB: 06/06/1932, 78 y.o.   MRN: [id number]  HPI 37 yoM never smoker with past hx of DVT/ PE and bronchitis, complicated by hx of lung nodule and GERD. Last here September 01, 2009. His doctor prefers to keep INR 2.5 to 3.5. There have been no more clots or bleeding. His cardiologists following him after remote question of Mitral Prolapse have suggested to him that maybe he should stay on coumadin. This week in pollen season has noted a very little wheeze as he goes to bed, attributed to pollen. He notes gradually a little more dyspnea with exertion- discussed in terms of age and fitness. He notes snore despite nasal strips. At home he always sleeps on his side.   09/02/11- 78 yoM never smoker with past hx of DVT/ PE and bronchitis, complicated by hx of lung nodule and GERD, Vagal instability He continues Coumadin. His cardiologist now feels he does not have mitral valve disease so that indication for Coumadin no longer holds. We reviewed laboratory result documented positive lupus antibody as a risk for his clotting. He has been evaluated for overactive vagal nerve response. He now takes atropine before procedures after having had cardiac flatline on a couple of occasions.  ROS-see HPI Constitutional:   No-   weight loss, night sweats, fevers, chills, fatigue, lassitude. No bleeding HEENT:   No-  headaches, difficulty swallowing, tooth/dental problems, sore throat,       No-  sneezing, itching, ear ache, nasal congestion, post nasal drip,  CV:  No-   chest pain, orthopnea, PND, swelling in lower extremities, anasarca, dizziness, palpitations Resp: No-   shortness of breath with exertion or at rest.              No-   productive cough,  No non-productive cough,  No- coughing up of blood.              No-   change in color of mucus.  No- wheezing.   Skin: No-   rash or lesions. GI:  No-   heartburn, indigestion, abdominal pain, nausea, vomiting,  GU: MS:  No-    joint pain or swelling.  Neuro-     nothing unusual Psych:  No- change in mood or affect. No depression or anxiety.  No memory loss.   OBJ- Physical Exam General- Alert, Oriented, Affect-appropriate, Distress- none acute Skin- rash-none, lesions- none, excoriation- none Lymphadenopathy- none Head- atraumatic            Eyes- Gross vision intact, PERRLA, conjunctivae and secretions clear            Ears- Hearing, canals-normal            Nose- Clear, +Septal dev an dev external nose,  No-mucus, polyps, erosion, perforation             Throat- Mallampati II , mucosa clear , drainage- none, tonsils- atrophic Neck- flexible , trachea midline, no stridor , thyroid nl, carotid no bruit Chest - symmetrical excursion , unlabored           Heart/CV- RRR , no murmur , no gallop  , no rub, nl s1 s2                           - JVD- none , edema- none, stasis changes- none, varices- none           Lung- clear  to P&A, wheeze- none, cough- none , dullness-none, rub- none           Chest wall-  Abd-  Br/ Gen/ Rectal- Not done, not indicated Extrem- cyanosis- none, clubbing, none, atrophy- none, strength- nl Neuro- grossly intact to observation

## 2011-09-05 NOTE — Assessment & Plan Note (Addendum)
He is going to stay on Coumadin since he has had no adverse consequences and carries a long term potential increased risk. We discussed the availability of newer medicines. He will stick with what is familiar.

## 2012-01-07 ENCOUNTER — Ambulatory Visit (INDEPENDENT_AMBULATORY_CARE_PROVIDER_SITE_OTHER): Payer: Medicare Other | Admitting: Cardiovascular Disease

## 2012-01-07 ENCOUNTER — Encounter: Payer: Self-pay | Admitting: Cardiovascular Disease

## 2012-01-07 VITALS — BP 112/78 | HR 86 | Resp 18 | Ht 72.0 in | Wt 190.0 lb

## 2012-01-07 DIAGNOSIS — I2782 Chronic pulmonary embolism: Secondary | ICD-10-CM

## 2012-01-07 DIAGNOSIS — E785 Hyperlipidemia, unspecified: Secondary | ICD-10-CM

## 2012-01-07 DIAGNOSIS — I059 Rheumatic mitral valve disease, unspecified: Secondary | ICD-10-CM

## 2012-01-07 DIAGNOSIS — I341 Nonrheumatic mitral (valve) prolapse: Secondary | ICD-10-CM

## 2012-01-07 MED ORDER — EZETIMIBE 10 MG PO TABS: 10.0000 mg | ORAL_TABLET | Freq: Every day | ORAL | Status: AC

## 2012-01-07 NOTE — Patient Instructions (Signed)

## 2012-01-07 NOTE — Progress Notes (Addendum)
History of Present Illness: 78 yo WM with history of PE, HLD, recurrent syncope secondary to vasovagal syndrome with a positive tilt test   who is here today for cardiac follow up. He has been followed in the past by Dr. Gala Romney. He has a history of unprovoked pulmonary emboli diagnosed in July of 2008. He has been on long term coumadin. He does not have any known history of coronary artery disease. He had a normal stress echocardiogram in September of 2007 with his previous cardiologist. He had an episode of CP in March 2010 and saw Dr. Ladona Ridgel. Had GXT which was normal. He has passed out easily his entire life and passes out after any procedure.   He is here today for follow up. No chest pain or SOB. He has noticed some pain in his left arm. He is active with no exertional pain. Walks 1 mile every day without problem.  Primary Care Physician: Thersa Salt  Last Lipid Profile: Followed in primary care.   Past Medical History  Diagnosis Date  . Pulmonary embolism   . DVT (deep venous thrombosis)   . Lung nodule   . GERD (gastroesophageal reflux disease)   . BPH (benign prostatic hyperplasia)   . Hyperlipidemia   . Syncope   . Chest pain   . Hernia     Past Surgical History  Procedure Date  . Inguinal hernia repair     x 3    Current Outpatient Prescriptions  Medication Sig Dispense Refill  . alfuzosin (UROXATRAL) 10 MG 24 hr tablet Take 10 mg by mouth daily.        [name] aspirin 81 MG tablet Take 81 mg by mouth daily.        [name] dutasteride (AVODART) 0.5 MG capsule Take 0.5 mg by mouth daily.        . ergocalciferol (VITAMIN D2) 50000 UNITS capsule Take by mouth 2 (two) times a week. 2 capsules       . esomeprazole (NEXIUM) 40 MG capsule Take 40 mg by mouth daily.        [name] lubiprostone (AMITIZA) 8 MCG capsule Take 8 mcg by mouth as needed.       . Omega-3 Fatty Acids (FISH OIL) 1000 MG CAPS Take 1 capsule by mouth daily.        . vitamin B-12 (CYANOCOBALAMIN) 100 MCG tablet Take  50 mcg by mouth daily.      [name] warfarin (COUMADIN) 5 MG tablet Use as directed       . WELCHOL 625 MG tablet Take 625 mg by mouth 2 (two) times daily with a meal.       . ZETIA 10 MG tablet Take 10 mg by mouth daily.       . mupirocin ointment (BACTROBAN) 2 %         Allergies  Allergen Reactions  . Anti-Oxidant     REACTION: intol  . Centrum     GI upset    History   Social History  . Marital Status: Married    Spouse Name: N/A    Number of Children: 2  . Years of Education: N/A   Occupational History  . Horticulturist, commercial for [employer], educational    Social History Main Topics  . Smoking status: Never Smoker   . Smokeless tobacco: Never Used  . Alcohol Use: No  . Drug Use: No  . Sexually Active: Not on file   Other Topics Concern  . Not on  file   Social History Narrative  . No narrative on file    Family History  Problem Relation Age of Onset  . Heart failure Father   . Cancer Mother     of eye-causing her to lose her eye  . Heart attack Father     x2    Review of Systems:  As stated in the HPI and otherwise negative.   BP 112/78  Pulse 86  Resp 18  Ht 6' (1.829 m)  Wt 190 lb (86.183 kg)  BMI 25.77 kg/m2  SpO2 98%  Physical Examination: General: Well developed, well nourished, NAD HEENT: OP clear, mucus membranes moist SKIN: warm, dry. No rashes. Neuro: No focal deficits Musculoskeletal: Muscle strength 5/5 all ext Psychiatric: Mood and affect normal Neck: No JVD, no carotid bruits, no thyromegaly, no lymphadenopathy. Lungs:Clear bilaterally, no wheezes, rhonci, crackles Cardiovascular: Regular rate and rhythm. No murmurs, gallops or rubs. Abdomen:Soft. Bowel sounds present. Non-tender.  Extremities: No lower extremity edema. Pulses are 2 + in the bilateral DP/PT.  EKG: NSR, , rate 80 bpm. RBBB. Non-specific T wave changes.   Assessment and Plan:   1.  Mitral valve prolapse: Will get echo to assess MV  2. PE: Will continue coumadin  therapy.   3. Hyperlipidemia: Followed in primary care

## 2012-01-09 ENCOUNTER — Encounter: Payer: Self-pay | Admitting: Cardiovascular Disease

## 2012-01-15 ENCOUNTER — Other Ambulatory Visit (HOSPITAL_COMMUNITY): Payer: Medicare Other

## 2012-01-15 ENCOUNTER — Ambulatory Visit (HOSPITAL_COMMUNITY): Payer: Medicare Other | Attending: Cardiovascular Disease

## 2012-01-15 DIAGNOSIS — I369 Nonrheumatic tricuspid valve disorder, unspecified: Secondary | ICD-10-CM | POA: Insufficient documentation

## 2012-01-15 DIAGNOSIS — I059 Rheumatic mitral valve disease, unspecified: Secondary | ICD-10-CM

## 2012-01-15 DIAGNOSIS — I359 Nonrheumatic aortic valve disorder, unspecified: Secondary | ICD-10-CM | POA: Insufficient documentation

## 2012-01-15 DIAGNOSIS — I341 Nonrheumatic mitral (valve) prolapse: Secondary | ICD-10-CM

## 2012-01-15 NOTE — Progress Notes (Signed)
Echocardiogram performed.  

## 2012-01-16 ENCOUNTER — Telehealth: Payer: Self-pay | Admitting: Internal Medicine

## 2012-01-16 NOTE — Telephone Encounter (Signed)
Walk In pt Form " pt Dropped Off Copy of Labs" placed in [facility] 01/16/12/KM

## 2012-01-17 ENCOUNTER — Other Ambulatory Visit: Payer: Self-pay | Admitting: *Deleted

## 2012-01-17 ENCOUNTER — Telehealth: Payer: Self-pay | Admitting: Cardiovascular Disease

## 2012-01-17 DIAGNOSIS — I77810 Thoracic aortic ectasia: Secondary | ICD-10-CM

## 2012-05-20 ENCOUNTER — Telehealth: Payer: Self-pay | Admitting: Cardiovascular Disease

## 2012-05-22 ENCOUNTER — Encounter: Payer: Self-pay | Admitting: Physician Assistant

## 2012-05-22 ENCOUNTER — Ambulatory Visit (INDEPENDENT_AMBULATORY_CARE_PROVIDER_SITE_OTHER): Payer: Medicare Other | Admitting: Physician Assistant

## 2012-05-22 VITALS — BP 128/71 | HR 61 | Ht 72.0 in | Wt 192.0 lb

## 2012-05-22 DIAGNOSIS — H8110 Benign paroxysmal vertigo, unspecified ear: Secondary | ICD-10-CM

## 2012-05-22 DIAGNOSIS — R42 Dizziness and giddiness: Secondary | ICD-10-CM

## 2012-05-22 NOTE — Patient Instructions (Addendum)
Your physician wants you to follow-up in: September as previously planned with Dr. Clifton James. You will receive a reminder letter in the mail two months in advance. If you don't receive a letter, please call our office to schedule the follow-up appointment.  Your physician has requested that you have an echocardiogram. Echocardiography is a painless test that uses sound waves to create images of your heart. It provides your doctor with information about the size and shape of your heart and how well your heart's chambers and valves are working. This procedure takes approximately one hour. There are no restrictions for this procedure. To be done in September prior to appt with Dr. Clifton James

## 2012-05-22 NOTE — Progress Notes (Signed)
[address] [place], [place]  [address] Phone: [phone], Fax:  [phone]  Date:  05/22/2012   ID:  Ein, Rijo [birth year], MRN [id number]  PCP:  Leandro Reasoner, MD  Primary Cardiologist:  Dr. Verne Carrow     History of Present Illness: [name] is a 79 y.o. male who returns for evaluation of dizziness.  He has a hx of pulmonary embolism, HL, MVP, recurrent syncope secondary to vasovagal syndrome a positive tilt test. Patient has been on long-term Coumadin since his unprovoked pulmonary embolism in 7/08. Stress echo 9/07 normal. ETT 3/10 secondary to chest pain normal.  Echo 9/13: Mild LVH, EF 60%, normal wall motion, aortic sclerosis without aortic stenosis, mild AI, mildly dilated descending aorta measuring 42 mm (repeat echo planned in one year). Last seen by Dr. Eulis Foster in 9/13.  Patient notes episode of "vertigo" 2 days ago and again this AM.  Typically occurs with turning his head to the left.  He describes classic spinning sensation.  Spinning is in clockwise rotation.  He denies chest pain, syncope, near syncope, dyspnea, orthopnea, PND, edema.  He denies facial droop, unilateral weakness, speech difficulty.  These symptoms remind him of symptoms he had years ago with vertigo.  He saw his PCP 2 days ago and received a Rx for antivert.   Labs (9/08):  K 3.8, creatinine 1.4, LDL 47, Hgb 13.9 Labs (9/13):  K 4.1, creatinine 1.08, ALT 34, LDL 60  Wt Readings from Last 3 Encounters:  05/22/12 192 lb (87.091 kg)  01/07/12 190 lb (86.183 kg)  09/02/11 191 lb (86.637 kg)     Past Medical History  Diagnosis Date  . Pulmonary embolism   . DVT (deep venous thrombosis)   . Lung nodule   . GERD (gastroesophageal reflux disease)   . BPH (benign prostatic hyperplasia)   . Hyperlipidemia   . Syncope   . Chest pain   . Hernia     Current Outpatient Prescriptions  Medication Sig Dispense Refill  . alfuzosin (UROXATRAL)  10 MG 24 hr tablet Take 10 mg by mouth daily.        [name] aspirin 81 MG tablet Take 81 mg by mouth daily.        [name] dutasteride (AVODART) 0.5 MG capsule Take 0.5 mg by mouth daily.        . ergocalciferol (VITAMIN D2) 50000 UNITS capsule Take by mouth 2 (two) times a week. 2 capsules       . esomeprazole (NEXIUM) 40 MG capsule Take 40 mg by mouth daily.        [name] ezetimibe (ZETIA) 10 MG tablet Take 1 tablet (10 mg total) by mouth daily.  30 tablet  11  . lubiprostone (AMITIZA) 8 MCG capsule Take 8 mcg by mouth as needed.       . mupirocin ointment (BACTROBAN) 2 % Apply topically as needed.       . Omega-3 Fatty Acids (FISH OIL) 1000 MG CAPS Take 1 capsule by mouth daily.        . vitamin B-12 (CYANOCOBALAMIN) 100 MCG tablet Take 50 mcg by mouth daily.      [name] warfarin (COUMADIN) 5 MG tablet Use as directed       . WELCHOL 625 MG tablet Take 625 mg by mouth 2 (two) times daily with a meal.         Allergies:    Allergies  Allergen Reactions  . Anti-Oxidant  REACTION: intol  . Centrum     GI upset    Social History:  The patient  reports that he has never smoked. He has never used smokeless tobacco. He reports that he does not drink alcohol or use illicit drugs.   ROS:  Please see the history of present illness.      All other systems reviewed and negative.   PHYSICAL EXAM: VS:  BP 128/71  Pulse 61  Ht 6' (1.829 m)  Wt 192 lb (87.091 kg)  BMI 26.04 kg/m2 Well nourished, well developed, in no acute distress HEENT: normal Neck: no JVD Vascular:  No carotid bruits Cardiac:  normal S1, S2; RRR; no murmur Lungs:  clear to auscultation bilaterally, no wheezing, rhonchi or rales Abd: soft, nontender, no hepatomegaly Ext: no edema Skin: warm and dry Neuro:  CNs 2-12 intact, no focal abnormalities noted  EKG:  NSR, HR 61, RBBB, no change since prior tracing.     ASSESSMENT AND PLAN:  1. Dizziness:   He describes classic symptoms of BPPV (benign paroxysmal positional vertigo).  I do  not see any indication for further cardiovascular testing.  We discussed possible definitive treatment with PT or ENT (Epley's Maneuvers).  He would like to d/w his PCP to find some close to [place]. 2. Pulmonary Embolism:  Coumadin managed by PCP. 3. Dilated Ascending Aortic Aneurysm:  Follow up planned in 01/2013. 4. Disposition:  Follow up with Dr. Verne Carrow in 01/2013 as planned.  Luna Glasgow, PA-C  9:51 AM 05/22/2012

## 2012-09-01 ENCOUNTER — Ambulatory Visit: Payer: Medicare Other | Admitting: Internal Medicine

## 2012-09-17 ENCOUNTER — Ambulatory Visit (INDEPENDENT_AMBULATORY_CARE_PROVIDER_SITE_OTHER): Payer: Medicare Other | Admitting: Internal Medicine

## 2012-09-17 ENCOUNTER — Encounter: Payer: Self-pay | Admitting: Internal Medicine

## 2012-09-17 VITALS — BP 122/62 | HR 72 | Ht 72.0 in | Wt 190.6 lb

## 2012-09-17 DIAGNOSIS — I2699 Other pulmonary embolism without acute cor pulmonale: Secondary | ICD-10-CM

## 2012-09-17 NOTE — Progress Notes (Signed)
Patient ID: [name], male    DOB: 05-26-1932, 79 y.o.   MRN: [id number]  HPI 38 yoM never smoker with past hx of DVT/ PE and bronchitis, complicated by hx of lung nodule and GERD. Last here September 01, 2009. His doctor prefers to keep INR 2.5 to 3.5. There have been no more clots or bleeding. His cardiologists following him after remote question of Mitral Prolapse have suggested to him that maybe he should stay on coumadin. This week in pollen season has noted a very little wheeze as he goes to bed, attributed to pollen. He notes gradually a little more dyspnea with exertion- discussed in terms of age and fitness. He notes snore despite nasal strips. At home he always sleeps on his side.   09/02/11- 78 yoM never smoker with past hx of DVT/ PE and bronchitis, complicated by hx of lung nodule and GERD, Vagal instability He continues Coumadin. His cardiologist now feels he does not have mitral valve disease so that indication for Coumadin no longer holds. We reviewed laboratory result documented positive lupus antibody as a risk for his clotting. He has been evaluated for overactive vagal nerve response. He now takes atropine before procedures after having had cardiac flatline on a couple of occasions.  09/17/12- 89 yoM never smoker with past hx of DVT/ PE and bronchitis, complicated by hx of lung nodule and GERD, Vagal instability FOLLOWS ONG:EXBMWU any wheezing, cough, congestion, or SOB. Denies any troubles with sinus as well. Went to [facility] for abnl WBC. Probably OK. PE originally happened after toe injury- discussed in terms of how long to continue coumadin- long discussion.   ROS-see HPI Constitutional:   No-   weight loss, night sweats, fevers, chills, fatigue, lassitude. No bleeding HEENT:   No-  headaches, difficulty swallowing, tooth/dental problems, sore throat,       No-  sneezing, itching, ear ache, nasal congestion, post nasal drip,  CV:  No-   chest pain, orthopnea,  PND, swelling in lower extremities, anasarca, dizziness, palpitations Resp: No-   shortness of breath with exertion or at rest.              No-   productive cough,  No non-productive cough,  No- coughing up of blood.              No-   change in color of mucus.  No- wheezing.   Skin: No-   rash or lesions. GI:  No-   heartburn, indigestion, abdominal pain, nausea, vomiting,  GU: MS:  No-   joint pain or swelling.  Neuro-     ? Vagal instability- eval by his other physicians. Psych:  No- change in mood or affect. No depression or anxiety.  No memory loss.   OBJ- Physical Exam General- Alert, Oriented, Affect-appropriate, Distress- none acute. Looks well Skin- rash-none, lesions- none, excoriation- none Lymphadenopathy- none Head- atraumatic            Eyes- Gross vision intact, PERRLA, conjunctivae and secretions clear            Ears- Hearing, canals-normal            Nose- Clear, +Septal dev an dev external nose,  No-mucus, polyps, erosion, perforation             Throat- Mallampati II , mucosa clear , drainage- none, tonsils- atrophic Neck- flexible , trachea midline, no stridor , thyroid nl, carotid no bruit Chest - symmetrical excursion , unlabored  Heart/CV- RRR , no murmur , no gallop  , no rub, nl s1 s2                           - JVD- none , edema- none, stasis changes- none, varices- none           Lung- clear to P&A, wheeze- none, cough- none , dullness-none, rub- none           Chest wall-  Abd-  Br/ Gen/ Rectal- Not done, not indicated Extrem- cyanosis- none, clubbing, none, atrophy- none, strength- nl Neuro- grossly intact to observation

## 2012-09-17 NOTE — Patient Instructions (Addendum)
Copies of old labs printed  Please call as needed

## 2012-09-27 NOTE — Assessment & Plan Note (Addendum)
We discussed usual risk considerations after hx PE. I would accept if he chose to stop anticoagulation/ avoid stasis, or continue anticoagulation/ avoid bleeding risks.

## 2013-01-20 ENCOUNTER — Encounter: Payer: Self-pay | Admitting: Cardiovascular Disease

## 2013-01-20 ENCOUNTER — Ambulatory Visit (INDEPENDENT_AMBULATORY_CARE_PROVIDER_SITE_OTHER): Payer: Medicare Other | Admitting: Cardiovascular Disease

## 2013-01-20 VITALS — BP 135/88 | HR 81 | Ht 72.0 in | Wt 189.0 lb

## 2013-01-20 DIAGNOSIS — I77810 Thoracic aortic ectasia: Secondary | ICD-10-CM

## 2013-01-20 DIAGNOSIS — I359 Nonrheumatic aortic valve disorder, unspecified: Secondary | ICD-10-CM

## 2013-01-20 DIAGNOSIS — I351 Nonrheumatic aortic (valve) insufficiency: Secondary | ICD-10-CM

## 2013-01-20 NOTE — Progress Notes (Signed)
History of Present Illness: 79 yo WM with history of PE, HLD, recurrent syncope secondary to vasovagal syndrome with a positive tilt test   who is here today for cardiac follow up. He has been followed in the past by Dr. Gala Romney. He has a history of unprovoked pulmonary emboli diagnosed in July of 2008. He has been on long term coumadin. He does not have any known history of coronary artery disease. He had a normal stress echocardiogram in September of 2007 with his previous cardiologist. He had an episode of CP in March 2010 and saw Dr. Ladona Ridgel. Had GXT which was normal. He has passed out easily his entire life and passes out after any procedure.   He is here today for follow up. No chest pain or SOB. He has noticed some pain in his left arm. He is active with no exertional pain. Walks 1 mile every day without problem.  Primary Care Physician: Thersa Salt  Last Lipid Profile: Followed in primary care.   Past Medical History  Diagnosis Date  . Pulmonary embolism   . DVT (deep venous thrombosis)   . Lung nodule   . GERD (gastroesophageal reflux disease)   . BPH (benign prostatic hyperplasia)   . Hyperlipidemia   . Syncope   . Chest pain   . Hernia     Past Surgical History  Procedure Laterality Date  . Inguinal hernia repair      x 3  . Left leg trauma    . Eye surgery  1957  . Lipoma resection      Current Outpatient Prescriptions  Medication Sig Dispense Refill  . alfuzosin (UROXATRAL) 10 MG 24 hr tablet Take 10 mg by mouth daily.        [name] aspirin 81 MG tablet Take 81 mg by mouth daily.        [name] dutasteride (AVODART) 0.5 MG capsule Take 0.5 mg by mouth daily.        . ergocalciferol (VITAMIN D2) 50000 UNITS capsule Take by mouth 2 (two) times a week. 2 capsules       . esomeprazole (NEXIUM) 40 MG capsule Take 40 mg by mouth daily.        [name] ezetimibe (ZETIA) 10 MG tablet Take 1 tablet (10 mg total) by mouth daily.  30 tablet  11  . lubiprostone (AMITIZA) 8 MCG capsule  Take 8 mcg by mouth as needed.       . vitamin B-12 (CYANOCOBALAMIN) 100 MCG tablet Take 50 mcg by mouth daily.      [name] warfarin (COUMADIN) 5 MG tablet Use as directed       . WELCHOL 625 MG tablet Take 625 mg by mouth 2 (two) times daily with a meal.        No current facility-administered medications for this visit.    Allergies  Allergen Reactions  . Anti-Oxidant     REACTION: intol  . Centrum     GI upset    History   Social History  . Marital Status: Married    Spouse Name: N/A    Number of Children: 2  . Years of Education: N/A   Occupational History  . Horticulturist, commercial for [employer], educational    Social History Main Topics  . Smoking status: Never Smoker   . Smokeless tobacco: Never Used  . Alcohol Use: No  . Drug Use: No  . Sexual Activity: Not on file   Other Topics Concern  . Not on  file   Social History Narrative  . No narrative on file    Family History  Problem Relation Age of Onset  . Heart failure Father   . Cancer Mother     of eye-causing her to lose her eye  . Heart attack Father     x2    Review of Systems:  As stated in the HPI and otherwise negative.   BP 135/88  Pulse 81  Ht 6' (1.829 m)  Wt 189 lb (85.73 kg)  BMI 25.63 kg/m2  Physical Examination: General: Well developed, well nourished, NAD HEENT: OP clear, mucus membranes moist SKIN: warm, dry. No rashes. Neuro: No focal deficits Musculoskeletal: Muscle strength 5/5 all ext Psychiatric: Mood and affect normal Neck: No JVD, no carotid bruits, no thyromegaly, no lymphadenopathy. Lungs:Clear bilaterally, no wheezes, rhonci, crackles Cardiovascular: Regular rate and rhythm. No murmurs, gallops or rubs. Abdomen:Soft. Bowel sounds present. Non-tender.  Extremities: No lower extremity edema. Pulses are 2 + in the bilateral DP/PT.   Echo 01/15/12: Left ventricle: The cavity size was normal. Wall thickness was increased in a pattern of mild LVH. The estimated ejection  fraction was 60%. Wall motion was normal; there were no regional wall motion abnormalities. - Aortic valve: Sclerosis without stenosis. Mild regurgitation. - Aorta: Mild dilitation of the ascending aorta. Ascending aortic diameter: 42mm (S). - Left atrium: The atrium was mildly to moderately dilated.  Assessment and Plan:   1.  Aortic regurgitation: Mild by echo 2013.   2. PE: Will continue coumadin therapy. He has plans to see a hematologist soon.   3. Hyperlipidemia: Followed in primary care   4. Thoracic aortic aneurysm: Will repeat echo.

## 2013-01-20 NOTE — Patient Instructions (Signed)

## 2013-01-28 ENCOUNTER — Ambulatory Visit (HOSPITAL_COMMUNITY): Payer: Medicare Other | Attending: Cardiovascular Disease

## 2013-01-28 DIAGNOSIS — I359 Nonrheumatic aortic valve disorder, unspecified: Secondary | ICD-10-CM | POA: Insufficient documentation

## 2013-01-28 DIAGNOSIS — I379 Nonrheumatic pulmonary valve disorder, unspecified: Secondary | ICD-10-CM | POA: Insufficient documentation

## 2013-01-28 DIAGNOSIS — I079 Rheumatic tricuspid valve disease, unspecified: Secondary | ICD-10-CM | POA: Insufficient documentation

## 2013-01-28 DIAGNOSIS — I059 Rheumatic mitral valve disease, unspecified: Secondary | ICD-10-CM | POA: Insufficient documentation

## 2013-01-28 DIAGNOSIS — I351 Nonrheumatic aortic (valve) insufficiency: Secondary | ICD-10-CM

## 2013-01-28 NOTE — Progress Notes (Signed)
Echocardiogram performed.  

## 2013-03-11 ENCOUNTER — Other Ambulatory Visit: Payer: Self-pay

## 2013-08-27 ENCOUNTER — Telehealth: Payer: Self-pay | Admitting: Internal Medicine

## 2013-08-27 ENCOUNTER — Ambulatory Visit (HOSPITAL_COMMUNITY)
Admission: AD | Admit: 2013-08-27 | Discharge: 2013-08-27 | Disposition: A | Discharge status: Home / Self Care | Payer: Medicare Other | Source: Ambulatory Visit | Attending: Internal Medicine | Admitting: Internal Medicine

## 2013-08-27 ENCOUNTER — Telehealth: Payer: Self-pay | Admitting: Physician Assistant

## 2013-08-27 ENCOUNTER — Telehealth: Payer: Self-pay | Admitting: Cardiovascular Disease

## 2013-08-27 DIAGNOSIS — Z01812 Encounter for preprocedural laboratory examination: Secondary | ICD-10-CM

## 2013-08-27 DIAGNOSIS — R42 Dizziness and giddiness: Secondary | ICD-10-CM

## 2013-08-27 NOTE — Telephone Encounter (Signed)
I spoke to the patient regarding followup CTA of the head.  He just wanted to discuss when to get it.  It looks like orders for a BMET prior to the study were recommended since he just had a contrast CT yesterday.  Wilburt Finlay [facility]

## 2013-08-27 NOTE — Telephone Encounter (Signed)
New problem   Pt has question concerning his CT scan that his PCP ordered and need to speak to nurse concerning results. Please call pt.

## 2013-08-27 NOTE — Telephone Encounter (Signed)
Spoke with pt. He had CT scan done of head that showed an aneurysm. Pt picked this report up from [facility]. He is not sure if PCP received findings since they are closed this afternoon. I advised him to call his PCP office to see if he can reach his doc on call to discuss with them or to see if he needs to do anything. We have no way of pulling report up from this hospital he mentioned. He reports he will do so and he will try calling his cards since it mentioned "arteries doubled". He needed nothing further

## 2013-08-27 NOTE — Telephone Encounter (Signed)
---  States he called to Dr. Roxy Cedar office (pulmonary medicine) and they recommended he contact cardiology office.---  Patient reports vertigo and his equilibrium seems off for last several days, prior to that neck pain/headaches for which he has been seen by his PCP. PCP ordered head CT. Patient has copy of the CT scan report and is reading it to me.  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 He would like Dr. Weldon Picking input since his Dr. Isidore Moos is closed this afternoon.  I instructed him to call PCP office and request call back from on call physician to discuss further at this time.  Also advised that I would discuss with our DOD.  Spoke with Dr. Tenny Craw (DOD), informed of symptoms and patients reading of his report.  She recommends scheduling CTA ([facility]) now, if creatinine okay--BMET, CBC prior to scan.   Will enter the orders.  Spoke again with patient--his PCP told him he could go to [facility] and have scan--if they can do it tonight. Informed him of Dr. Charlott Rakes recommendation.  He will go to [facility] ED if his local hospital cannot do angiogram tonight.  Spoke with Wes in Radiology at [facility] is aware that patient may need to be add on tonight.

## 2013-08-27 NOTE — Telephone Encounter (Signed)
lmtcb

## 2013-08-28 ENCOUNTER — Telehealth: Payer: Self-pay | Admitting: Nurse Practitioner

## 2013-08-28 NOTE — Telephone Encounter (Signed)
Pt called back this afternoon to report that he did not have CT last night @ [facility] b/c he was concerned about prior auth through his insurance company.  Radiologist apparently looked at the written report here and he was told that in the absence of bleeding on the original CT that he had, that the f/u CT could be done electively once prior auth was obtained.  Pt would now like to that the CT.  He's weighing either coming to the [facility] ED or going to [facility] ED.  After 15 mins, on the phone, he has decided to have f/u CT @ [facility] since that is where the original exam was done and since his PCP ordered the original exam.  I advised that that seems prudent.

## 2013-08-30 NOTE — Telephone Encounter (Signed)
Thanks, cdm 

## 2013-09-01 ENCOUNTER — Encounter: Payer: Self-pay | Admitting: *Deleted

## 2013-09-01 ENCOUNTER — Encounter: Payer: Self-pay | Admitting: Neurology

## 2013-09-01 ENCOUNTER — Ambulatory Visit (INDEPENDENT_AMBULATORY_CARE_PROVIDER_SITE_OTHER): Payer: Medicare Other | Admitting: Neurology

## 2013-09-01 VITALS — BP 128/70 | HR 68 | Temp 98.2°F | Resp 16 | Wt 190.2 lb

## 2013-09-01 DIAGNOSIS — I7789 Other specified disorders of arteries and arterioles: Secondary | ICD-10-CM

## 2013-09-01 DIAGNOSIS — G609 Hereditary and idiopathic neuropathy, unspecified: Secondary | ICD-10-CM

## 2013-09-01 DIAGNOSIS — H8110 Benign paroxysmal vertigo, unspecified ear: Secondary | ICD-10-CM

## 2013-09-01 LAB — SEDIMENTATION RATE: SED RATE: 1 mm/h (ref 0–16)

## 2013-09-01 LAB — VITAMIN B12: Vitamin B-12: 660 pg/mL (ref 211–911)

## 2013-09-01 LAB — TSH: TSH: 1.955 u[IU]/mL (ref 0.350–4.500)

## 2013-09-01 NOTE — Addendum Note (Signed)
Addended by: Glean Salen E on: 09/01/2013 02:37 PM   Modules accepted: Orders

## 2013-09-01 NOTE — Progress Notes (Signed)
Patient came in to let us know that the [place] lab can't do the 2 hour glucose tolerance test until June.  Can he wait until June?  He lives in [place] so he could do the test there at another facility.  ([facility])  He was also wondering if he needs to keep his appointment with the ENT tomorrow.   Cell phone: [phone]  His return appointment to come here is on 12-01-13.

## 2013-09-01 NOTE — Progress Notes (Signed)
NEUROLOGY CONSULTATION NOTE  GURVIR SCHROM MRN: [id number] DOB: [birth year]  Referring provider: Dr. Theodora Blow Primary care provider: Dr. Theodora Blow  Reason for consult:  Dizziness.  HISTORY OF PRESENT ILLNESS: [name] is a 80 year old right-handed man with history of pulmonary embolism in 7/08, aortic regurgitation, hyperlipidemia, MVP, recurrent syncope secondary to vasovagal syndrome with positive tilt table who presents for dizziness.  She is accompanied by his wife.  Records and images were personally reviewed where available.    About 2 months ago, he developed sudden onset left-sided neck pain that radiated from the shoulder up to behind the ear. There was no precipitating trauma or strenuous activity. Pain resolved in approximately 2 or 3 weeks. Afterwards, he began experiencing a sense of imbalance and heaviness in the head. He also describes it as a sense of lightheadedness. It usually occurs when he is walking. At first, he thought that he veered towards the left a little more. He denied any problems using either hand. He denied any focal numbness or weakness of the face and extremities. About 2 weeks ago, he began having spells of vertigo, which he describes as spinning sensation. It would occur with change in position, such as turning in bed, bending forward, or any quick movement of the head. It would usually last only 10-15 seconds. It was not associated with any nausea. He has a history of sensorineural hearing loss but no new change in hearing or tinnitus. 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  It was recommended that he go emergently to have a CTA of the head with and without contrast as well.  [name]  He was told to go to [facility] ED.  It revealed fusiform ectasia of the left ICA, measuring up to  6-7 mm in diameter, due to increased supply to the cerebral arterial circulation, as the left ICA supplies both branches of the ACA.  Vertebrobasilar system is unremarkable.  He was told that the findings were not an aneurysm and not related to his symptoms.  EKG and blood work was reportedly okay.  He was told to follow up as an outpatient.    He reports that he had a similar episode of neck pain approximately 2 years ago, which was followed by a sense of dizziness. About a year ago, he had brief episodes of vertigo as well.  He has a history of pulmonary embolism from 2008, in which he was on Coumadin up until November 2014. He is currently taking aspirin 81 mg daily. He also has history of recurrent syncope secondary to vasovagal syndrome. He reports that he has some numbness on the bottom of his feet, and he experiences a sensation of pulling and tightness of his skin on the bottom of his feet. He reportedly has a history of "borderline diabetes ".  PAST MEDICAL HISTORY: Past Medical History  Diagnosis Date  . Pulmonary embolism   . DVT (deep venous thrombosis)   . Lung nodule   . GERD (gastroesophageal reflux disease)   . BPH (benign prostatic hyperplasia)   . Hyperlipidemia   . Syncope   . Chest pain   . Hernia     PAST SURGICAL HISTORY: Past Surgical History  Procedure Laterality Date  . Inguinal hernia repair      x 3  .  Left leg trauma    . Eye surgery  1957  . Lipoma resection      MEDICATIONS: Current Outpatient Prescriptions on File Prior to Visit  Medication Sig Dispense Refill  . alfuzosin (UROXATRAL) 10 MG 24 hr tablet Take 10 mg by mouth daily.        [name] aspirin 81 MG tablet Take 81 mg by mouth daily.        . ergocalciferol (VITAMIN D2) 50000 UNITS capsule Take by mouth 2 (two) times a week. 2 capsules       . esomeprazole (NEXIUM) 40 MG capsule Take 40 mg by mouth daily.        [name] ezetimibe (ZETIA) 10 MG tablet Take 1 tablet (10 mg total) by mouth daily.  30 tablet   11  . lubiprostone (AMITIZA) 8 MCG capsule Take 8 mcg by mouth as needed.       . vitamin B-12 (CYANOCOBALAMIN) 100 MCG tablet Take 50 mcg by mouth daily.      Earnestine Mealing 625 MG tablet Take 625 mg by mouth 2 (two) times daily with a meal.       . dutasteride (AVODART) 0.5 MG capsule Take 0.5 mg by mouth daily.        [name] warfarin (COUMADIN) 5 MG tablet Use as directed        No current facility-administered medications on file prior to visit.    ALLERGIES: Allergies  Allergen Reactions  . Anti-Oxidant     REACTION: intol  . Centrum     GI upset    FAMILY HISTORY: Family History  Problem Relation Age of Onset  . Heart failure Father   . Cancer Mother     of eye-causing her to lose her eye  . Heart attack Father     x2    SOCIAL HISTORY: History   Social History  . Marital Status: Married    Spouse Name: N/A    Number of Children: 2  . Years of Education: N/A   Occupational History  . 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 Smoking status: Never Smoker   . Smokeless tobacco: Never Used  . Alcohol Use: No  . Drug Use: No  . Sexual Activity: Not on file   Other Topics Concern  . Not on file   Social History Narrative  . No narrative on file    REVIEW OF SYSTEMS: Constitutional: No fevers, chills, or sweats, no generalized fatigue, change in appetite Eyes: recent cataract surgery Ear, nose and throat: No hearing loss, ear pain, nasal congestion, sore throat Cardiovascular: No chest pain, palpitations Respiratory:  No shortness of breath at rest or with exertion, wheezes GastrointestinaI: No nausea, vomiting, diarrhea, abdominal pain, fecal incontinence Genitourinary:  No dysuria, urinary retention or frequency Musculoskeletal:  No neck pain, back pain Integumentary: No rash, pruritus, skin lesions Neurological: as above Psychiatric: No depression, insomnia, anxiety Endocrine: No palpitations, fatigue, diaphoresis, mood  swings, change in appetite, change in weight, increased thirst Hematologic/Lymphatic:  No anemia, purpura, petechiae. Allergic/Immunologic: no itchy/runny eyes, nasal congestion, recent allergic reactions, rashes  PHYSICAL EXAM: Filed Vitals:   09/01/13 1015  BP: 128/70  Pulse: 68  Temp: 98.2 F (36.8 C)  Resp: 16   General: No acute distress Head:  Normocephalic/atraumatic Neck: supple, no paraspinal tenderness, full range of motion Back: No paraspinal tenderness Heart: regular rate and rhythm Lungs: Clear to auscultation bilaterally. Vascular: No carotid bruits. Neurological Exam: Mental  status: alert and oriented to person, place, and time, recent and remote memory intact, fund of knowledge intact, attention and concentration intact, speech fluent and not dysarthric, language intact. Cranial nerves: CN I: not tested CN II: pupils equal, round and reactive to light, visual fields intact, fundi unremarkable, without vessel changes, exudates, hemorrhages or papilledema. CN III, IV, VI:  full range of motion, no nystagmus, no ptosis CN V: facial sensation intact CN VII: upper and lower face symmetric CN VIII: hearing intact CN IX, X: gag intact, uvula midline CN XI: sternocleidomastoid and trapezius muscles intact CN XII: tongue midline Bulk & Tone: normal, no fasciculations. Motor: 5 out of 5 throughout Sensation: Reduced pinprick sensation on the bottom of his feet. Reduced vibration sensation in the toes. Deep Tendon Reflexes: 2+ throughout, except absent in the ankles. Toes downgoing. Finger to nose testing: No tremor or dysmetria. Heel to shin: No dysmetria. Gait: Normal station and stride. Appeared a little unsteady when turning around. Was able to walk in tandem with some mild difficulty. Romberg negative. Dix-Hallpike positive with head turn to the left.  IMPRESSION: 1.  benign paroxysmal positional vertigo. 2.  peripheral neuropathy. This may be secondary to  borderline diabetes. This also may be contributing to his unsteadiness. 3.  Cannot rule out a cerebrovascular event.  The neck pain could suggest vertebral dissection, however carotid dopplers did not reveal anything.  Non-focal exam reassuring. 4.  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  Unlikely to be contributing to dizziness.  PLAN: 1.  Although the ectasia is likely benign, I would like a referral to a vascular neurosurgeon for their opinion on if anything needs to be done. 2.  Will get MRI of the brain to assess for any posterior circulation stroke 3.  Performed Epley maneuver in office and provided self-exercise directions. 4.  Check neuropathy panel including 2 hour glucose tolerance test, SPEP/IFE, ANA, ESR, B12, TSH 5.  Follow up in 3 months  60 minutes spent with the patient, over 50% spent counseling, reviewing the CT scans, coordinating care, and performing the Epley maneuver.  Thank you for allowing me to take part in the care of this patient.  Metta Clines, DO  CC:  Theresia Majors, MD

## 2013-09-01 NOTE — Patient Instructions (Addendum)
1. The vertigo is typical vertigo.  Practice the exercises. 2.  We will get MRI of the brain.  [facility]  09/14/13 4:00 pm arrival take valium when you get into the hospital . 3.  We will check blood work looking for causes of vertigo. Your provider has requested that you have labwork completed today. Please go to [facility] on the first floor of this building before leaving the office today. We would like you to have a two hour glucose tolerance test. This test is performed at [facility] located at [facility] - basement level. Please call [phone] to arrange a convenient time to have testing completed.  4.  We will refer to neurosurgery to evaluate the the CT, although I think it is likely benign (not an aneurysm). 5.  Follow up in 3 months.

## 2013-09-02 LAB — ANTI-NUCLEAR AB-TITER (ANA TITER): ANA Titer 1: NEGATIVE

## 2013-09-02 LAB — ANA: ANA: POSITIVE — AB

## 2013-09-03 LAB — SPEP & IFE WITH QIG
Albumin ELP: 57.2 % (ref 55.8–66.1)
Alpha-1-Globulin: 3.8 % (ref 2.9–4.9)
Alpha-2-Globulin: 11.2 % (ref 7.1–11.8)
Beta 2: 5.2 % (ref 3.2–6.5)
Beta Globulin: 6.5 % (ref 4.7–7.2)
GAMMA GLOBULIN: 16.1 % (ref 11.1–18.8)
IGG (IMMUNOGLOBIN G), SERUM: 1320 mg/dL (ref 650–1600)
IGM, SERUM: 46 mg/dL (ref 41–251)
IgA: 395 mg/dL — ABNORMAL HIGH (ref 68–379)
TOTAL PROTEIN, SERUM ELECTROPHOR: 7 g/dL (ref 6.0–8.3)

## 2013-09-03 NOTE — Progress Notes (Signed)
Lab slip was signed and faxed to [facility] for 2 hour glucose tolerance testing

## 2013-09-06 ENCOUNTER — Telehealth: Payer: Self-pay | Admitting: Neurology

## 2013-09-06 NOTE — Telephone Encounter (Signed)
I have returned this patients call no answer . Left message for patient to return call.

## 2013-09-06 NOTE — Telephone Encounter (Signed)
Pt needs to talk to someone about appt please call [phone]

## 2013-09-08 ENCOUNTER — Telehealth: Payer: Self-pay | Admitting: Neurology

## 2013-09-08 NOTE — Telephone Encounter (Signed)
Please call Juliette Alcide at [facility] regarding referral. She needs a copy of the patient's CT report in EP[facility] and needs a referral entered in EPIC as well. CB# [phone] / Sherri S.

## 2013-09-08 NOTE — Telephone Encounter (Signed)
See previous note, [name] called again. She says note in EPIC states pt needs to see Vascular Neurosurgeon, if this is the case, they will probably not be able to see the patient as they do not have a vascular neurosurgeon on staff. / [name].

## 2013-09-09 ENCOUNTER — Telehealth: Payer: Self-pay | Admitting: Neurology

## 2013-09-09 NOTE — Telephone Encounter (Signed)
Pt needs to talk to someone about MRI [phone]

## 2013-09-09 NOTE — Telephone Encounter (Signed)
Left a message on patient voicemail to return call 

## 2013-09-10 NOTE — Telephone Encounter (Signed)
Left a message for [name] to call office  .

## 2013-09-10 NOTE — Telephone Encounter (Signed)
Velna Hatchet from [employer] [employer] [employer] Please call back ASAP. Has pt had any radiological scans? Trying to get pt in next week but needs this info first. CB# [phone] / Sherri S.

## 2013-09-10 NOTE — Telephone Encounter (Signed)
I returned [name] call and left message on her voicemail

## 2013-09-10 NOTE — Telephone Encounter (Signed)
Returned your call.

## 2013-09-13 ENCOUNTER — Telehealth: Payer: Self-pay | Admitting: *Deleted

## 2013-09-13 NOTE — Telephone Encounter (Signed)
Patient is aware he has 1 5 mg valium at pharmacy for MRI today

## 2013-09-13 NOTE — Telephone Encounter (Signed)
1 .  5 mg valium called to [facility]

## 2013-09-14 ENCOUNTER — Ambulatory Visit (HOSPITAL_COMMUNITY)
Admission: RE | Admit: 2013-09-14 | Discharge: 2013-09-14 | Disposition: A | Discharge status: Home / Self Care | Payer: Medicare Other | Source: Ambulatory Visit | Attending: Neurology | Admitting: Neurology

## 2013-09-14 DIAGNOSIS — H8110 Benign paroxysmal vertigo, unspecified ear: Secondary | ICD-10-CM | POA: Insufficient documentation

## 2013-09-15 ENCOUNTER — Telehealth: Payer: Self-pay | Admitting: *Deleted

## 2013-09-15 NOTE — Telephone Encounter (Signed)
Patient is aware of MRI results

## 2013-09-17 ENCOUNTER — Other Ambulatory Visit: Payer: Medicare Other

## 2013-09-17 ENCOUNTER — Ambulatory Visit: Payer: Medicare Other | Admitting: Internal Medicine

## 2013-09-17 NOTE — Telephone Encounter (Signed)
Patient wants to know the status please call [phone]

## 2013-09-17 NOTE — Telephone Encounter (Signed)
I called and and left voice message on patients voice mail that [facility] at Vascular Neurosurgeon office was looking over his records with Dr and would be calling him today or Monday the first thing to let him know about appt

## 2013-09-20 ENCOUNTER — Telehealth: Payer: Self-pay | Admitting: *Deleted

## 2013-09-20 NOTE — Telephone Encounter (Signed)
Patient is aware that he has appt on 09/28/13 with Dr Conchita Paris at 930am

## 2013-11-18 NOTE — Telephone Encounter (Signed)
Close encounter 

## 2013-11-18 NOTE — Telephone Encounter (Signed)
CLOSE ENCOUNTER °

## 2013-11-30 ENCOUNTER — Ambulatory Visit: Payer: Medicare Other | Admitting: Internal Medicine

## 2013-12-01 ENCOUNTER — Ambulatory Visit (INDEPENDENT_AMBULATORY_CARE_PROVIDER_SITE_OTHER): Payer: Medicare Other | Admitting: Neurology

## 2013-12-01 ENCOUNTER — Encounter: Payer: Self-pay | Admitting: Neurology

## 2013-12-01 VITALS — BP 100/68 | HR 78 | Ht 72.0 in | Wt 184.4 lb

## 2013-12-01 DIAGNOSIS — I7789 Other specified disorders of arteries and arterioles: Secondary | ICD-10-CM

## 2013-12-01 DIAGNOSIS — H8110 Benign paroxysmal vertigo, unspecified ear: Secondary | ICD-10-CM

## 2013-12-01 DIAGNOSIS — H8112 Benign paroxysmal vertigo, left ear: Secondary | ICD-10-CM

## 2013-12-01 NOTE — Progress Notes (Signed)
NEUROLOGY FOLLOW UP OFFICE NOTE  [name] [id number]  HISTORY OF PRESENT ILLNESS: [name] is a 80 year old right-handed man with history of pulmonary embolism in 7/08, aortic regurgitation, hyperlipidemia, MVP, recurrent syncope secondary to vasovagal syndrome with positive tilt table who follows up for BPPV and peripheral neuropathy.  Records and images reviewed.  UPDATE: 09/14/13 MRI BRAIN WO (performed to evaluated for a posterior circulation stroke):  No acute infarct.  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  He was evaluated by Dr. Conchita Paris at [facility].  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  He expressed a sensation of tightness under his feet.  Neuropathy panel from 09/01/13:  SPEP/IFE unremarkable, ANA negative, Sed Rate 1, TSH 1.955, B12 660.  2 Hour glucose tolerance test from 09/03/13 was unremarkable.  Instructed to perform Epley maneuver at home.  Vertigo resolved after a couple of sessions.  HISTORY: About 5 months ago, he developed sudden onset left-sided neck pain that radiated from the shoulder up to behind the ear. There was no precipitating trauma or strenuous activity. Pain resolved in approximately 2 or 3 weeks. Afterwards, he began experiencing a sense of imbalance and heaviness in the head. He also describes it as a sense of lightheadedness. It usually occurs when he is walking. At first, he thought that he veered towards the left a little more. He denied any problems using either hand. He denied any focal numbness or weakness of the face and extremities. In April, he began having spells of vertigo, which he describes as spinning sensation. It would occur with change in position, such as turning in bed, bending forward, or any quick movement of the head. It would usually last only  10-15 seconds. It was not associated with any nausea. He has a history of sensorineural hearing loss but no new change in hearing or tinnitus. 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  It was recommended that he go emergently to have a CTA of the head with and without contrast as well.  [name]  He was told to go to [facility] ED.  It revealed fusiform ectasia of the left ICA, measuring up to 6-7 mm in diameter, due to increased supply to the cerebral arterial circulation, as the left ICA supplies both branches of the ACA.  Vertebrobasilar system is unremarkable.  He was told that the findings were not an aneurysm and not related to his symptoms.  EKG and blood work was reportedly okay.  He was told to follow up as an outpatient.  On my exam in April, he had a positive Dix-Hallpike on the left.  He reports that he had a similar episode of neck pain approximately 2 years ago, which was followed by a sense of dizziness. About a year ago, he had brief episodes of vertigo as well.  He has a history of pulmonary embolism from 2008, in which he was on Coumadin up until November 2014. He is currently taking aspirin 81 mg daily. He also has history of recurrent syncope secondary to vasovagal syndrome. He reports that he has some numbness on the bottom of his feet, and he experiences a sensation of pulling and  tightness of his skin on the bottom of his feet. He reportedly has a history of "borderline diabetes ".  PAST MEDICAL HISTORY: Past Medical History  Diagnosis Date  . Pulmonary embolism   . DVT (deep venous thrombosis)   . Lung nodule   . GERD (gastroesophageal reflux disease)   . BPH (benign prostatic hyperplasia)   . Hyperlipidemia   . Syncope   . Chest pain   . Hernia     MEDICATIONS: Current Outpatient Prescriptions on File  Prior to Visit  Medication Sig Dispense Refill  . alfuzosin (UROXATRAL) 10 MG 24 hr tablet Take 10 mg by mouth daily.        [name]. aspirin 81 MG tablet Take 81 mg by mouth daily.        [name]. dutasteride (AVODART) 0.5 MG capsule Take 0.5 mg by mouth daily.        . ergocalciferol (VITAMIN D2) 50000 UNITS capsule Take by mouth 2 (two) times a week. 2 capsules       . esomeprazole (NEXIUM) 40 MG capsule Take 40 mg by mouth daily.        [name]. ezetimibe (ZETIA) 10 MG tablet Take 1 tablet (10 mg total) by mouth daily.  30 tablet  11  . lubiprostone (AMITIZA) 8 MCG capsule Take 8 mcg by mouth as needed.       . vitamin B-12 (CYANOCOBALAMIN) 100 MCG tablet Take 50 mcg by mouth daily.      [name]. warfarin (COUMADIN) 5 MG tablet Use as directed       . WELCHOL 625 MG tablet Take 625 mg by mouth 2 (two) times daily with a meal.        No current facility-administered medications on file prior to visit.    ALLERGIES: Allergies  Allergen Reactions  . Anti-Oxidant     REACTION: intol  . Centrum     GI upset    FAMILY HISTORY: Family History  Problem Relation Age of Onset  . Heart failure Father   . Cancer Mother     of eye-causing her to lose her eye  . Heart attack Father     x2    SOCIAL HISTORY: History   Social History  . Marital Status: Married    Spouse Name: N/A    Number of Children: 2  . Years of Education: N/A   Occupational History  . Horticulturist, commercialcredit management for Psychologist, educationalfurniture manufacturer    Social History Main Topics  . Smoking status: Never Smoker   . Smokeless tobacco: Never Used  . Alcohol Use: No  . Drug Use: No  . Sexual Activity: Not on file   Other Topics Concern  . Not on file   Social History Narrative  . No narrative on file    REVIEW OF SYSTEMS: Constitutional: No fevers, chills, or sweats, no generalized fatigue, change in appetite Eyes: No visual changes, double vision, eye pain Ear, nose and throat: No hearing loss, ear pain, nasal congestion, sore  throat Cardiovascular: No chest pain, palpitations Respiratory:  No shortness of breath at rest or with exertion, wheezes GastrointestinaI: No nausea, vomiting, diarrhea, abdominal pain, fecal incontinence Genitourinary:  No dysuria, urinary retention or frequency Musculoskeletal:  No neck pain, back pain Integumentary: No rash, pruritus, skin lesions Neurological: as above Psychiatric: No depression, insomnia, anxiety Endocrine: No palpitations, fatigue, diaphoresis, mood swings, change in appetite, change in weight, increased thirst Hematologic/Lymphatic:  No anemia, purpura, petechiae. Allergic/Immunologic: no itchy/runny eyes, nasal congestion, recent allergic reactions,  rashes  PHYSICAL EXAM: Filed Vitals:   12/01/13 1312  BP: 100/68  Pulse: 78   General: No acute distress Head:  Normocephalic/atraumatic Neck: supple, no paraspinal tenderness, full range of motion Heart:  Regular rate and rhythm Lungs:  Clear to auscultation bilaterally Back: No paraspinal tenderness Neurological Exam: alert and oriented to person, place, and time. Attention span and concentration intact, recent and remote memory intact, fund of knowledge intact.  Speech fluent and not dysarthric, language intact.  CN II-XII intact. Fundoscopic exam unremarkable without vessel changes, exudates, hemorrhages or papilledema.  Bulk and tone normal, muscle strength 5/5 throughout.  Sensation to light touch, temperature and vibration intact.  Deep tendon reflexes 1+ throughout, toes downgoing.  Finger to nose and heel to shin testing intact.  Gait normal, Romberg negative.  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 I actually do not appreciate objective findings of neuropathy on today's exam  PLAN: No follow up unless as needed.  15 minutes spent with patient, over 50% spent counseling and coordinating care.  Shon Millet, DO  CC:  Thersa Salt, MD

## 2013-12-01 NOTE — Patient Instructions (Signed)
Everything looks okay.  Follow up as needed. 

## 2014-01-24 ENCOUNTER — Encounter: Payer: Self-pay | Admitting: Cardiovascular Disease

## 2014-01-24 ENCOUNTER — Ambulatory Visit (INDEPENDENT_AMBULATORY_CARE_PROVIDER_SITE_OTHER): Payer: Medicare Other | Admitting: Cardiovascular Disease

## 2014-01-24 VITALS — BP 122/78 | HR 87 | Ht 72.0 in | Wt 187.0 lb

## 2014-01-24 DIAGNOSIS — I359 Nonrheumatic aortic valve disorder, unspecified: Secondary | ICD-10-CM

## 2014-01-24 DIAGNOSIS — I351 Nonrheumatic aortic (valve) insufficiency: Secondary | ICD-10-CM

## 2014-01-24 DIAGNOSIS — I712 Thoracic aortic aneurysm, without rupture, unspecified: Secondary | ICD-10-CM

## 2014-01-24 DIAGNOSIS — R55 Syncope and collapse: Secondary | ICD-10-CM

## 2014-01-24 DIAGNOSIS — I2782 Chronic pulmonary embolism: Secondary | ICD-10-CM

## 2014-01-24 NOTE — Patient Instructions (Signed)
Your physician wants you to follow-up in:  12 months.  You will receive a reminder letter in the mail two months in advance. If you don't receive a letter, please call our office to schedule the follow-up appointment.   

## 2014-01-24 NOTE — Progress Notes (Signed)
History of Present Illness: 80 yo WM with history of PE, HLD, recurrent syncope secondary to vasovagal syndrome with a positive tilt test   who is here today for cardiac follow up. He has been followed in the past by Dr. Gala Romney. He has a history of unprovoked pulmonary emboli diagnosed in July of 2008 and was on coumadin until this was stopped in August 2015 by Hematology. He does not have any known history of coronary artery disease. He had a normal stress echocardiogram in September of 2007 with his previous cardiologist. He had an episode of CP in March 2010 and saw Dr. Ladona Ridgel. Had GXT which was normal. He has passed out easily his entire life and passes out after any procedure. 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 He was seen by Dr. Everlena Cooper with Neurology and also evaluated by Neurosurgery. No further workup planned. Dizziness felt to be due to vertigo.   He is here today for follow up. No chest pain or SOB. He has noticed some pain in his left arm. He is active with no exertional pain. Walks 1 mile every day without problem.  Primary Care Physician: Thersa Salt  Last Lipid Profile: Followed in primary care.   Past Medical History  Diagnosis Date  . Pulmonary embolism   . DVT (deep venous thrombosis)   . Lung nodule   . GERD (gastroesophageal reflux disease)   . BPH (benign prostatic hyperplasia)   . Hyperlipidemia   . Syncope   . Chest pain   . Hernia     Past Surgical History  Procedure Laterality Date  . Inguinal hernia repair      x 3  . Left leg trauma    . Eye surgery  1957  . Lipoma resection      Current Outpatient Prescriptions  Medication Sig Dispense Refill  . alfuzosin (UROXATRAL) 10 MG 24 hr tablet Take 10 mg by mouth daily.        [name] aspirin 81 MG tablet Take 81 mg by mouth daily.        . Clobetasol Propionate 0.05 % shampoo       .  colesevelam (WELCHOL) 625 MG tablet Take 625 mg by mouth 4 (four) times daily.      . cyanocobalamin 100 MCG tablet Take 100 mcg by mouth every other day.       . ergocalciferol (VITAMIN D2) 50000 UNITS capsule Take by mouth once a week. 2 capsules      . esomeprazole (NEXIUM) 40 MG capsule Take 40 mg by mouth daily.        [name] ezetimibe (ZETIA) 10 MG tablet Take 1 tablet (10 mg total) by mouth daily.  30 tablet  11  . lubiprostone (AMITIZA) 8 MCG capsule Take 8 mcg by mouth as needed.       . Vitamin D, Ergocalciferol, (DRISDOL) 50000 UNITS CAPS capsule Take 50,000 Units by mouth.       No current facility-administered medications for this visit.    Allergies  Allergen Reactions  . Anti-Oxidant     REACTION: intol  . Centrum     GI upset    History   Social History  . Marital Status: Married    Spouse Name: N/A    Number of Children: 2  . Years of Education: N/A   Occupational History  .  Horticulturist, commercial for Psychologist, educational    Social History Main Topics  . Smoking status: Never Smoker   . Smokeless tobacco: Never Used  . Alcohol Use: No  . Drug Use: No  . Sexual Activity: Not on file   Other Topics Concern  . Not on file   Social History Narrative  . No narrative on file    Family History  Problem Relation Age of Onset  . Heart failure Father   . Cancer Mother     of eye-causing her to lose her eye  . Heart attack Father     x2    Review of Systems:  As stated in the HPI and otherwise negative.   BP 122/78  Pulse 87  Ht 6' (1.829 m)  Wt 187 lb (84.823 kg)  BMI 25.36 kg/m2  Physical Examination: General: Well developed, well nourished, NAD HEENT: OP clear, mucus membranes moist SKIN: warm, dry. No rashes. Neuro: No focal deficits Musculoskeletal: Muscle strength 5/5 all ext Psychiatric: Mood and affect normal Neck: No JVD, no carotid bruits, no thyromegaly, no lymphadenopathy. Lungs:Clear bilaterally, no wheezes, rhonci,  crackles Cardiovascular: Regular rate and rhythm. No murmurs, gallops or rubs. Abdomen:Soft. Bowel sounds present. Non-tender.  Extremities: No lower extremity edema. Pulses are 2 + in the bilateral DP/PT.  Echo 01/28/13: Left ventricle: The cavity size was normal. Wall thickness was normal. Systolic function was normal. The estimated ejection fraction was in the range of 60% to 65%. Wall motion was normal; there were no regional wall motion abnormalities. Doppler parameters are consistent with abnormal left ventricular relaxation (grade 1 diastolic dysfunction). - Aortic valve: Mild regurgitation. - Right atrium: The atrium was mildly dilated. - Atrial septum: No defect or patent foramen ovale was Identified.  EKG: NSR, rate 87 bpm. RBBB.  Assessment and Plan:   1.  Aortic regurgitation: Mild by echo 2014.  2. PE: Coumadin stopped by hematology. He is not felt to have a hypercoagulable state per pt report.   3. Hyperlipidemia: Followed in primary care.   4. Thoracic aortic aneurysm: Mild by echo September 2014.   5. Vasovagal syncope: No recurrence

## 2014-08-05 ENCOUNTER — Telehealth: Payer: Self-pay | Admitting: Cardiovascular Disease

## 2014-08-05 NOTE — Telephone Encounter (Signed)
New Message  Pt wanted to speak w/ RN- local MD did EKG on pt. Pt wanted to know if we have seen that in pt's hx. Please call back and discuss.

## 2014-08-05 NOTE — Telephone Encounter (Signed)
Spoke with pt who reports he had EKG done at primary care doctor recently. Pt states he was told EKG showed extra beats and this was nothing to be concerned about.  Pt would like to know if previous EKG's showed this.  Pt unable to give any additional information about EKG.  I told pt he has known right bundle branch block but he does not think this was seen on EKG done at primary care.  He will fax or mail EKG to our office for Dr. Clifton James to review.  Pt not due for follow up until September but scheduled appt for next week as he is concerned about EKG.  Will determine if appt next week needed after EKG reviewed.

## 2014-08-08 NOTE — Telephone Encounter (Signed)
Message left on pt's identified voicemail that fax of EKG was received in office and I would review with Dr. Clifton James when he was back in office on August 10, 2014

## 2014-08-08 NOTE — Telephone Encounter (Signed)
Follow up      Wanting you to know EKG was faxed this am.  Please call pt when you get it

## 2014-08-09 ENCOUNTER — Telehealth: Payer: Self-pay | Admitting: Cardiovascular Disease

## 2014-08-09 NOTE — Telephone Encounter (Signed)
New problem   Pt is requesting a call back from a nurse today concerning his previous message. Pt is very upset that no one called him back.

## 2014-08-09 NOTE — Telephone Encounter (Signed)
Pt made aware RN and MD have reviewed EKG from PCP's office and compared with previous EKG.  Pt educated that faxed EKG was of poor quality but from what was viewed he looked to have PVC or PAC's, which are not uncommon.  Pt educated that though they are not specifically seen on previous EKG's it does not mean he has not had them before. Pt stated he feels fine with no c/o of CP or SOB. Pt was told it was fine for him to travel tomorrow.  Pt agreed and no additional questions at this time.

## 2014-08-09 NOTE — Telephone Encounter (Signed)
Will forward to Dr Phineas Real RN, they are here in the morning.

## 2014-08-09 NOTE — Telephone Encounter (Signed)
Pt calling back very frustrated about waiting until Wednesday to be called back, I explained to him that's when his dr will be in the office to review EKG. He said that the message that was left was not his question, he states he was told his EKG showed an extra heartbeat and wants to have someone go back through his chart and let him know if he had ever had this before. Also is this something he could die from or can he cxl appt with [facility] Thursday due to a trip he has planned, does he need to cxl trip and come in or go to the ER.

## 2014-08-10 NOTE — Telephone Encounter (Signed)
This shows early beats (PACs) from the top of his heart which are not dangerous. I agree with primary care that this is nothing to worry about. Avoid stimulants like caffeine, nicotine and over the counter cold meds. chris

## 2014-08-10 NOTE — Telephone Encounter (Signed)
Spoke with pt and gave him information from Dr. Clifton James. He is questioning if he ever had PAC's before and I reviewed old EKG's and told him I did not see PAC's on previous EKG's. Pt concerned about communication in office and reports he is discouraged he did not receive call sooner.  Pt reports he has cancelled appt with Dr. Clifton James scheduled for August 11, 2014 and has scheduled an appt with another cardiologist.  Dr. Clifton James also spoke with pt this AM regarding EKG he faxed to our office.

## 2014-08-11 ENCOUNTER — Ambulatory Visit: Payer: Self-pay | Admitting: Cardiovascular Disease

## 2015-02-09 ENCOUNTER — Other Ambulatory Visit: Payer: Medicare Other

## 2015-02-09 ENCOUNTER — Telehealth: Payer: Self-pay | Admitting: Internal Medicine

## 2015-02-09 ENCOUNTER — Ambulatory Visit (HOSPITAL_COMMUNITY): Payer: Medicare Other

## 2015-02-09 DIAGNOSIS — M25561 Pain in right knee: Secondary | ICD-10-CM

## 2015-02-09 DIAGNOSIS — Z8672 Personal history of thrombophlebitis: Secondary | ICD-10-CM

## 2015-02-09 NOTE — Telephone Encounter (Signed)
Per CY-let's have patient set up for d-dimer lab work today or tomorrow and have patient set up for venous doppler asap as well. Pt can come in on Monday 02-13-15 AM appt-pt will ask for Zuri Lascala directly so I can discuss tests and appt times(s) with him. Will place orders once patient is aware.

## 2015-02-09 NOTE — Telephone Encounter (Signed)
Pt is aware of lab entered for today and venous doppler for this afternoon or first thing Friday morning. Pt will follow up on Monday 02-13-15 at 9:15am with CY. Pt will come to [facility] lab today and then speak with [facility] for information on doppler. Nothing more needed at this time.

## 2015-02-09 NOTE — Telephone Encounter (Signed)
Spoke with pt. States that he thinks he might be getting another blood clot in his leg. He is having pain in his right leg. Would like to be seen by CY before his next available. CY - can we work the pt in with you?

## 2015-02-10 ENCOUNTER — Ambulatory Visit (HOSPITAL_COMMUNITY)
Admission: RE | Admit: 2015-02-10 | Discharge: 2015-02-10 | Disposition: A | Discharge status: Home / Self Care | Payer: Medicare Other | Source: Ambulatory Visit | Attending: Internal Medicine | Admitting: Internal Medicine

## 2015-02-10 DIAGNOSIS — M79661 Pain in right lower leg: Secondary | ICD-10-CM | POA: Diagnosis present

## 2015-02-10 DIAGNOSIS — Z8672 Personal history of thrombophlebitis: Secondary | ICD-10-CM

## 2015-02-10 DIAGNOSIS — M25561 Pain in right knee: Secondary | ICD-10-CM | POA: Diagnosis not present

## 2015-02-10 DIAGNOSIS — E785 Hyperlipidemia, unspecified: Secondary | ICD-10-CM | POA: Insufficient documentation

## 2015-02-10 LAB — D-DIMER, QUANTITATIVE: D-Dimer, Quant: 0.74 ug/mL-FEU — ABNORMAL HIGH (ref 0.00–0.48)

## 2015-02-13 ENCOUNTER — Telehealth: Payer: Self-pay | Admitting: Internal Medicine

## 2015-02-13 ENCOUNTER — Ambulatory Visit: Payer: Self-pay | Admitting: Internal Medicine

## 2015-02-13 NOTE — Telephone Encounter (Signed)
See my comment Results of leg vein Dopplers. I will be oot for a week. He can see TP if needed/

## 2015-02-13 NOTE — Progress Notes (Signed)
Quick Note:  Attempted to contact pt, no answer. LMOM to call back. ______ 

## 2015-02-13 NOTE — Telephone Encounter (Signed)
Notes Recorded by Waymon Budge, MD on 02/13/2015 at 10:22 AM Doppler tests of leg veins show no clots- good report. Ok to return for office f/u only if needed ----------------------- Pt aware of D-Dimer and doppler results.  Pt scheduled for next available appt with CY.  Nothing further needed.

## 2015-02-13 NOTE — Telephone Encounter (Signed)
Pt would like another appointment  Asap.[name]

## 2015-02-13 NOTE — Telephone Encounter (Signed)
Errror.[name]

## 2015-02-13 NOTE — Telephone Encounter (Signed)
Notes Recorded by Waymon Budge, MD on 02/10/2015 at 2:08 PM D-dimer result is a little above normal, but in ambiguous range and probably ok. We will wait on results of leg vein doppler test. ---  Pt is aware of above. He is requesting his doppler results. Pt still currently in [facility]. Please advise Dr. Maple [name] thanks

## 2015-05-23 ENCOUNTER — Encounter: Payer: Self-pay | Admitting: Internal Medicine

## 2015-05-23 ENCOUNTER — Ambulatory Visit (INDEPENDENT_AMBULATORY_CARE_PROVIDER_SITE_OTHER): Payer: Medicare Other | Admitting: Internal Medicine

## 2015-05-23 VITALS — BP 126/74 | HR 74 | Ht 72.0 in | Wt 186.4 lb

## 2015-05-23 DIAGNOSIS — I82401 Acute embolism and thrombosis of unspecified deep veins of right lower extremity: Secondary | ICD-10-CM

## 2015-05-23 DIAGNOSIS — R55 Syncope and collapse: Secondary | ICD-10-CM | POA: Diagnosis not present

## 2015-05-23 NOTE — Assessment & Plan Note (Signed)
He describes episodes associated with anesthesia. I advised him with any anticipated surgical intervention, to discuss in detail with anesthesiologist and surgeon ahead of time. Atropine has taken care of the problem previously.

## 2015-05-23 NOTE — Progress Notes (Signed)
Patient ID: [name], male    DOB: 17-Apr-1933, 82 y.o.   MRN: [id number]  HPI 59 yoM never smoker with past hx of DVT/ PE and bronchitis, complicated by hx of lung nodule and GERD. Last here September 01, 2009. His doctor prefers to keep INR 2.5 to 3.5. There have been no more clots or bleeding. His cardiologists following him after remote question of Mitral Prolapse have suggested to him that maybe he should stay on coumadin. This week in pollen season has noted a very little wheeze as he goes to bed, attributed to pollen. He notes gradually a little more dyspnea with exertion- discussed in terms of age and fitness. He notes snore despite nasal strips. At home he always sleeps on his side.   09/02/11- 78 yoM never smoker with past hx of DVT/ PE and bronchitis, complicated by hx of lung nodule and GERD, Vagal instability He continues Coumadin. His cardiologist now feels he does not have mitral valve disease so that indication for Coumadin no longer holds. We reviewed laboratory result documented positive lupus antibody as a risk for his clotting. He has been evaluated for overactive vagal nerve response. He now takes atropine before procedures after having had cardiac flatline on a couple of occasions.  09/17/12- 71 yoM never smoker with past hx of DVT/ PE and bronchitis, complicated by hx of lung nodule and GERD, Vagal instability FOLLOWS [id number] any wheezing, cough, congestion, or SOB. Denies any troubles with sinus as well. Went to [facility] for abnl WBC. Probably OK. PE originally happened after toe injury- discussed in terms of how long to continue coumadin- long discussion.   05/23/2015-82 year old male never smoker with past history of DVT/PE, bronchitis, complicated by history lung nodule, GERD, Vagal instability FOLLOWS FOR: Last seen 09-2012; routine check up;slight wheezing at times but not often.  He had called about right leg pain but workup was negative for recurrent  clot. Eventually diagnosed with degenerative disc disease in spine, nerve root irritation causing leg pain. Off warfarin for 2 or 3 years now. Chest feels comfortable with no routine cough or wheeze. Old nasal fracture. Right nostril stuffy at night. Previous ENT had recommended repair and he asks reassurance that he could tolerate this. I suggested he get current opinion from ENT. He has history of abrupt blood pressure drop with anesthesia, blamed on vasovagal instability  ROS-see HPI Constitutional:   No-   weight loss, night sweats, fevers, chills, fatigue, lassitude. No bleeding HEENT:   No-  headaches, difficulty swallowing, tooth/dental problems, sore throat,       No-  sneezing, itching, ear ache, + nasal congestion, post nasal drip,  CV:  No-   chest pain, orthopnea, PND, swelling in lower extremities, anasarca, dizziness, palpitations Resp: No-   shortness of breath with exertion or at rest.              No-   productive cough,  No non-productive cough,  No- coughing up of blood.              No-   change in color of mucus.  No- wheezing.   Skin: No-   rash or lesions. GI:  No-   heartburn, indigestion, abdominal pain, nausea, vomiting,  GU: MS:  No-   joint pain or swelling.  Neuro-     ? Vagal instability- eval by his other physicians. Psych:  No- change in mood or affect. No depression or anxiety.  No memory loss.  OBJ- Physical Exam General- Alert, Oriented, Affect-appropriate, Distress- none acute. Looks well Skin- rash-none, lesions- none, excoriation- none Lymphadenopathy- none Head- atraumatic            Eyes- Gross vision intact, PERRLA, conjunctivae and secretions clear            Ears- Hearing, canals-normal            Nose- Clear, +Septal dev an dev external nose,  No-mucus, polyps, erosion, perforation             Throat- Mallampati II , mucosa clear , drainage- none, tonsils- atrophic Neck- flexible , trachea midline, no stridor , thyroid nl, carotid no  bruit Chest - symmetrical excursion , unlabored           Heart/CV- RRR , no murmur , no gallop  , no rub, nl s1 s2                           - JVD- none , edema- none, stasis changes- none, varices- none           Lung- clear to P&A, wheeze- none, cough- none , dullness-none, rub- none           Chest wall-  Abd-  Br/ Gen/ Rectal- Not done, not indicated Extrem- cyanosis- none, clubbing, none, atrophy- none, strength- nl Neuro- grossly intact to observation

## 2015-05-23 NOTE — Assessment & Plan Note (Signed)
No recurrence. Successfully off anticoagulation. Leg pain was from nerve root irritation from degenerative disc disease

## 2015-05-23 NOTE — Patient Instructions (Addendum)
I'm glad you are doing so well.   Please call if we can help. 

## 2016-03-11 ENCOUNTER — Telehealth: Payer: Self-pay | Admitting: Internal Medicine

## 2016-03-11 NOTE — Telephone Encounter (Signed)
Error

## 2016-03-13 ENCOUNTER — Ambulatory Visit (INDEPENDENT_AMBULATORY_CARE_PROVIDER_SITE_OTHER): Payer: Medicare Other | Admitting: Internal Medicine

## 2016-03-13 ENCOUNTER — Encounter: Payer: Self-pay | Admitting: Internal Medicine

## 2016-03-13 ENCOUNTER — Other Ambulatory Visit (INDEPENDENT_AMBULATORY_CARE_PROVIDER_SITE_OTHER): Payer: Medicare Other

## 2016-03-13 ENCOUNTER — Ambulatory Visit: Payer: Medicare Other | Admitting: Internal Medicine

## 2016-03-13 VITALS — BP 122/66 | HR 84 | Ht 72.0 in | Wt 185.0 lb

## 2016-03-13 DIAGNOSIS — J45901 Unspecified asthma with (acute) exacerbation: Secondary | ICD-10-CM | POA: Diagnosis not present

## 2016-03-13 DIAGNOSIS — R0600 Dyspnea, unspecified: Secondary | ICD-10-CM

## 2016-03-13 DIAGNOSIS — J209 Acute bronchitis, unspecified: Secondary | ICD-10-CM | POA: Diagnosis not present

## 2016-03-13 DIAGNOSIS — R911 Solitary pulmonary nodule: Secondary | ICD-10-CM | POA: Diagnosis not present

## 2016-03-13 LAB — BASIC METABOLIC PANEL
BUN: 18 mg/dL (ref 6–23)
CALCIUM: 9.2 mg/dL (ref 8.4–10.5)
CHLORIDE: 107 meq/L (ref 96–112)
CO2: 28 mEq/L (ref 19–32)
CREATININE: 1.54 mg/dL — AB (ref 0.40–1.50)
GFR: 46.3 mL/min — AB (ref >60.00)
Glucose, Bld: 109 mg/dL — ABNORMAL HIGH (ref 70–99)
Potassium: 4.2 mEq/L (ref 3.5–5.1)
Sodium: 141 mEq/L (ref 135–145)

## 2016-03-13 LAB — NITRIC OXIDE: Nitric Oxide: 25

## 2016-03-13 MED ORDER — FLUTICASONE FUROATE-VILANTEROL 200-25 MCG/INH IN AEPB: 1.0000 | INHALATION_SPRAY | Freq: Every day | RESPIRATORY_TRACT | 0 refills | Status: DC

## 2016-03-13 MED ORDER — FLUTICASONE FUROATE-VILANTEROL 200-25 MCG/INH IN AEPB
1.0000 | INHALATION_SPRAY | Freq: Every day | RESPIRATORY_TRACT | 12 refills | Status: DC
Start: 2016-03-13 — End: 2016-04-19

## 2016-03-13 MED ORDER — LEVALBUTEROL HCL 0.63 MG/3ML IN NEBU
0.6300 mg | INHALATION_SOLUTION | Freq: Once | RESPIRATORY_TRACT | Status: AC
Start: 2016-03-13 — End: 2016-03-13
  Administered 2016-03-13: 0.63 mg via RESPIRATORY_TRACT
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
## 2016-03-13 NOTE — Progress Notes (Signed)
Patient seen in the office today and instructed on use of Breo 257mcg/25mcg.  Patient expressed understanding and demonstrated technique.  Florentina Addison [facility] 03/13/16

## 2016-03-13 NOTE — Assessment & Plan Note (Signed)
He is "gun shy" with any chest discomfort, that he might have recurrence of PE. I don't think that is what is going on now. Plan-lab for d-dimer.

## 2016-03-13 NOTE — Assessment & Plan Note (Signed)
No lung nodule described on CXR report from 02/15/2016. Significant active process is unlikely.

## 2016-03-13 NOTE — Patient Instructions (Addendum)
Order- D-dimer, IgE, BMET        Dx dyspnea, asthmatic bronchitis exacerbation  Order- FENO     Dx asthmatic bronchitis exacerbation  Order- office spirometry  Neb xop 0.63  Sample/ Rx  - Breo 200   Inhale   1 puff then rinse mouth, once daily
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
## 2016-03-14 LAB — D-DIMER, QUANTITATIVE: D-Dimer, Quant: 0.54 mcg/mL FEU — ABNORMAL HIGH (ref <0.50)

## 2016-03-14 LAB — IGE: IgE (Immunoglobulin E), Serum: 188 kU/L — ABNORMAL HIGH (ref <115)

## 2016-03-18 ENCOUNTER — Telehealth: Payer: Self-pay | Admitting: Internal Medicine

## 2016-03-18 NOTE — Telephone Encounter (Signed)
Notes Recorded by Waymon Budge, MD on 03/15/2016 at 8:18 AM EST Lab results- D-dimer 0.54 - very low chance of recent blood clot.           Chemistry- kidney function is below normal, with creatinine 1.54. Pt can review with his PCP                Total IgE allergy antibody class is elevated 188 (normal < 115). Very non-specific, but can be associated with allergy symptonms   Ref Range & Units 5d ago  IgE (Immunoglobulin E), Serum <115 kU/L 188    Resulting Agency  [facility]  Narrative   Performed at: [facility]        [address]        [place], [place] [address]    Specimen Collected: 03/13/16 12:35 Last Resulted: 03/14/16 16:19                    Spoke with pt and notified of results per Dr. Maple [name]. Pt verbalized understanding and denied any questions.

## 2016-03-18 NOTE — Progress Notes (Signed)
Spoke with pt and notified of results per Dr. Wert. Pt verbalized understanding and denied any questions. 

## 2016-03-21 ENCOUNTER — Telehealth: Payer: Self-pay | Admitting: Internal Medicine

## 2016-03-21 NOTE — Telephone Encounter (Signed)
Pt aware that OV notes and labs have been sent. Nothing more needed at this time.

## 2016-03-21 NOTE — Telephone Encounter (Signed)
Pt is aware that labs have been placed in the mail. Nothing more needed at this time.

## 2016-04-14 ENCOUNTER — Telehealth: Payer: Self-pay | Admitting: Physician Assistant

## 2016-04-14 NOTE — Telephone Encounter (Addendum)
Pt had gone to the ER in [place] for CP. Ez neg so far. Pt and wife want to know how to get tx [facility].  Advised hospital-hospital tx unlikely or very expensive.  Advised leaving AMA and driving here could be risky, depending on his condition.  Best bet, let them finish their eval, tell you what they think should be done and then decide what they are going to do.   Sx have resolved, initial ez negative and EDP said ECG was ok.  F/u in [place] as scheduled. I will have office call them on Monday to make sure everything is ok.  Theodore Demark, Cordelia Poche 04/13/2016 12:43 PM Beeper [phone]

## 2016-04-15 NOTE — Telephone Encounter (Signed)
[name], Can you check on him today? [name]

## 2016-04-15 NOTE — Telephone Encounter (Signed)
I spoke with pt. He reports Saturday's event was determined to be pleurisy. EKG and CTA were done and per pt report these were OK.  He did not have a heart attack.  Told to follow up with cardiology.  Pt reports he was given pain medication in ED but he is taking tylenol and this is helping his pain. Pt reports he would like to continue to see Dr. Clifton James. Has not seen another cardiologist. Appt has been made for pt to see Dr. Clifton James on 04/19/16. He is asking if sooner appt available.  I explained to pt 12/15 is the next time Dr. Clifton James is in office.  I offered him an earlier appt with PA or NP but he would like to wait to see Dr. Clifton James.

## 2016-04-19 ENCOUNTER — Ambulatory Visit (INDEPENDENT_AMBULATORY_CARE_PROVIDER_SITE_OTHER): Payer: Medicare Other | Admitting: Cardiovascular Disease

## 2016-04-19 ENCOUNTER — Encounter: Payer: Self-pay | Admitting: Cardiovascular Disease

## 2016-04-19 VITALS — BP 146/70 | HR 92 | Ht 71.0 in | Wt 183.8 lb

## 2016-04-19 DIAGNOSIS — I712 Thoracic aortic aneurysm, without rupture, unspecified: Secondary | ICD-10-CM

## 2016-04-19 DIAGNOSIS — R072 Precordial pain: Secondary | ICD-10-CM | POA: Diagnosis not present

## 2016-04-19 DIAGNOSIS — I351 Nonrheumatic aortic (valve) insufficiency: Secondary | ICD-10-CM | POA: Diagnosis not present

## 2016-04-19 NOTE — Patient Instructions (Addendum)
Medication Instructions:  Your physician recommends that you continue on your current medications as directed. Please refer to the Current Medication list given to you today.   Labwork: none  Testing/Procedures: Your physician has requested that you have an exercise stress myoview. For further information please visit [email or web address] Please follow instruction sheet, as given.  Your physician has requested that you have an echocardiogram. Echocardiography is a painless test that uses sound waves to create images of your heart. It provides your doctor with information about the size and shape of your heart and how well your heart's chambers and valves are working. This procedure takes approximately one hour. There are no restrictions for this procedure.    Follow-Up: Your physician recommends that you schedule a follow-up appointment in: 4-6 weeks. Scheduled for January 31,2018 at 11:15    Any Other Special Instructions Will Be Listed Below (If Applicable).     If you need a refill on your cardiac medications before your next appointment, please call your pharmacy.

## 2016-04-19 NOTE — Progress Notes (Signed)
Chief Complaint  Patient presents with  . Follow-up   History of Present Illness: 82 yo WM with history of PE, HLD, recurrent syncope secondary to vasovagal syndrome with a positive tilt test who is here today for cardiac follow up. He has a history of unprovoked pulmonary emboli diagnosed in July of 2008 and was on coumadin until this was stopped in August 2015 by Hematology. He does not have any known history of coronary artery disease. He had a normal stress echocardiogram in September of 2007 with his previous cardiologist. He had an episode of chest pain in March 2010 and saw Dr. Ladona Ridgelaylor. Exercise stress test was normal. He has passed out easily his entire life and passes out after any procedure. 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 He was seen by Dr. Everlena CooperJaffe with Neurology and also evaluated by Neurosurgery. No further workup planned. Dizziness felt to be due to vertigo. [facility] with left sided chest pain. EKG with no ischemic changes. He was felt to have pleurisy. CT chest negative per pt.   He is here today for follow up. His chest pain is mostly resolved. No dyspnea. Still with mild left sided chest pain, worsened with movement and deep breaths. No LE edema, palpitations.   Primary Care Physician: Leandro ReasonerHAKKAR,NEHAL P, MD  Past Medical History:  Diagnosis Date  . BPH (benign prostatic hyperplasia)   . Chest pain   . DVT (deep venous thrombosis) (HCC)   . GERD (gastroesophageal reflux disease)   . Hernia   . Hyperlipidemia   . Lung nodule   . Pulmonary embolism (HCC)   . Syncope     Past Surgical History:  Procedure Laterality Date  . EYE SURGERY  1957  . INGUINAL HERNIA REPAIR     x 3  . Left leg trauma    . LIPOMA RESECTION      Current Outpatient Prescriptions  Medication Sig Dispense Refill  . alfuzosin  (UROXATRAL) 10 MG 24 hr tablet Take 10 mg by mouth daily.      [name]. aspirin 81 MG tablet Take 81 mg by mouth daily.      . colesevelam (WELCHOL) 625 MG tablet Take 625 mg by mouth 4 (four) times daily.    . Cyanocobalamin (VITAMIN B12) 1000 MCG TBCR Take 1 tablet by mouth daily.    . Ergocalciferol (VITAMIN D2) 400 units TABS Take 1 tablet by mouth daily.    [name]. esomeprazole (NEXIUM) 40 MG capsule Take 40 mg by mouth daily.      [name]. ezetimibe (ZETIA) 10 MG tablet Take 1 tablet (10 mg total) by mouth daily. 30 tablet 11  . levocetirizine (XYZAL) 5 MG tablet Take 1 tablet by mouth daily.  0  . lubiprostone (AMITIZA) 8 MCG capsule Take 8 mcg by mouth daily.     . montelukast (SINGULAIR) 10 MG tablet Take 1 tablet by mouth at bedtime.  0   No current facility-administered medications for this visit.     Allergies  Allergen Reactions  . Anti-Oxidant     REACTION: intol  . Centrum     GI upset    Social History   Social History  . Marital status: Married    Spouse name: N/A  . Number of children: 2  . Years of education: N/A  Occupational History  . Horticulturist, commercial for [employer], educational    Social History Main Topics  . Smoking status: Never Smoker  . Smokeless tobacco: Never Used  . Alcohol use No  . Drug use: No  . Sexual activity: Not on file   Other Topics Concern  . Not on file   Social History Narrative  . No narrative on file    Family History  Problem Relation Age of Onset  . Heart failure Father   . Cancer Mother     of eye-causing her to lose her eye  . Heart attack Father     x2    Review of Systems:  As stated in the HPI and otherwise negative.   BP (!) 146/70   Pulse 92   Ht 5\' 11"  (1.803 m)   Wt 183 lb 12.8 oz (83.4 kg)   BMI 25.63 kg/m   Physical Examination: General: Well developed, well nourished, NAD  HEENT: OP clear, mucus membranes moist  SKIN: warm, dry. No rashes. Neuro: No focal deficits  Musculoskeletal: Muscle strength 5/5 all  ext  Psychiatric: Mood and affect normal  Neck: No JVD, no carotid bruits, no thyromegaly, no lymphadenopathy.  Lungs:Clear bilaterally, no wheezes, rhonci, crackles Cardiovascular: Regular rate and rhythm. No murmurs, gallops or rubs. Abdomen:Soft. Bowel sounds present. Non-tender.  Extremities: No lower extremity edema. Pulses are 2 + in the bilateral DP/PT.  Echo 01/28/13: Left ventricle: The cavity size was normal. Wall thickness was normal. Systolic function was normal. The estimated ejection fraction was in the range of 60% to 65%. Wall motion was normal; there were no regional wall motion abnormalities. Doppler parameters are consistent with abnormal left ventricular relaxation (grade 1 diastolic dysfunction). - Aortic valve: Mild regurgitation. - Right atrium: The atrium was mildly dilated. - Atrial septum: No defect or patent foramen ovale was Identified.  EKG:  EKG is not ordered today. The ekg from 04/13/16 at OSH is reviewed by me and shows sinus brady, rate 56 pbm. RBBB  Recent Labs: 03/13/2016: BUN 18; Creatinine, Ser 1.54; Potassium 4.2; Sodium 141    Wt Readings from Last 3 Encounters:  04/19/16 183 lb 12.8 oz (83.4 kg)  03/13/16 185 lb (83.9 kg)  05/23/15 186 lb 6.4 oz (84.6 kg)     Other studies Reviewed: Additional studies/ records that were reviewed today include: . Review of the above records demonstrates:   Assessment and Plan:   1.  Aortic regurgitation: Mild by echo 2014. Repeat echo now.   2. PE: Coumadin stopped by hematology. He is not felt to have a hypercoagulable state per pt report.   3. Hyperlipidemia: Followed in primary care.   4. Thoracic aortic aneurysm: Mild by echo September 2014. Repeat echo now to assess.   5. Chest pain: His pain sounds atypical. Likely pleurisy. Will arrange exercise stress myoview to exclude ischemia.   Current medicines are reviewed at length with the patient today.  The patient does not have concerns regarding  medicines.  The following changes have been made:  no change  Labs/ tests ordered today include:   Orders Placed This Encounter  Procedures  . Myocardial Perfusion Imaging  . ECHOCARDIOGRAM COMPLETE     Disposition:   FU with me in 4 weeks   Signed, Verne Carrow, MD 04/19/2016 11:03 AM    [facility] [address] [place], [place], [place]  [address] Phone: [phone]; Fax: [phone]

## 2016-04-22 ENCOUNTER — Telehealth (HOSPITAL_COMMUNITY): Payer: Self-pay | Admitting: *Deleted

## 2016-04-22 ENCOUNTER — Telehealth (HOSPITAL_COMMUNITY): Payer: Self-pay

## 2016-04-22 NOTE — Telephone Encounter (Signed)
Left message on voicemail in reference to upcoming appointment scheduled for 04/24/16. Phone number given for a call back so details instructions can be given. Blythe Hartshorn W   

## 2016-04-22 NOTE — Telephone Encounter (Signed)
Patient given detailed instructions per Myocardial Perfusion Study Information Sheet for the test on 04/24/16 at 0745. Patient notified to arrive 15 minutes early and that it is imperative to arrive on time for appointment to keep from having the test rescheduled.  If you need to cancel or reschedule your appointment, please call the office within 24 hours of your appointment. Failure to do so may result in a cancellation of your appointment, and a $50 no show fee. Patient verbalized understanding. Farris Has, CNMT, RT-N

## 2016-04-24 ENCOUNTER — Ambulatory Visit (HOSPITAL_COMMUNITY): Payer: Medicare Other | Attending: Internal Medicine

## 2016-04-24 ENCOUNTER — Telehealth: Payer: Self-pay | Admitting: Cardiovascular Disease

## 2016-04-24 DIAGNOSIS — R072 Precordial pain: Secondary | ICD-10-CM

## 2016-04-24 DIAGNOSIS — R42 Dizziness and giddiness: Secondary | ICD-10-CM | POA: Diagnosis not present

## 2016-04-24 DIAGNOSIS — I2510 Atherosclerotic heart disease of native coronary artery without angina pectoris: Secondary | ICD-10-CM | POA: Diagnosis present

## 2016-04-24 DIAGNOSIS — R079 Chest pain, unspecified: Secondary | ICD-10-CM | POA: Insufficient documentation

## 2016-04-24 LAB — MYOCARDIAL PERFUSION IMAGING
CHL CUP NUCLEAR SSS: 5
CSEPEDS: 0 s
CSEPEW: 7 METS
Exercise duration (min): 7 min
LV sys vol: 38 mL
LVDIAVOL: 85 mL (ref 62–150)
MPHR: 139 {beats}/min
NUC STRESS TID: 0.89
Peak HR: 123 {beats}/min
Percent HR: 88 %
RATE: 0.32
Rest HR: 68 {beats}/min
SDS: 2
SRS: 4

## 2016-04-24 MED ORDER — TECHNETIUM TC 99M TETROFOSMIN IV KIT
33.0000 | PACK | Freq: Once | INTRAVENOUS | Status: AC | PRN
  Administered 2016-04-24: 33 via INTRAVENOUS
  Filled 2016-04-24: qty 33

## 2016-04-24 MED ORDER — TECHNETIUM TC 99M TETROFOSMIN IV KIT
10.3000 | PACK | Freq: Once | INTRAVENOUS | Status: AC | PRN
  Administered 2016-04-24: 10.3 via INTRAVENOUS
  Filled 2016-04-24: qty 11

## 2016-04-24 NOTE — Telephone Encounter (Signed)
Received request for clearance for Dr. Allyson Sabal. Pt has never been seen by Dr. Allyson Sabal. Pt seen by Dr. Clifton James 04/19/16 in which a stress test and echo were ordered. ECHO scheduled for 05/09/16, stress test performed today.   Requesting surgical clearance:  1. Type of surgery: Left hand: Excision hand or finger mass less tan 1.5 cm, Trigger finger release (tendon sheath incision)  2. Surgeon: Bradly Bienenstock, MD  3.Surgical Date:  pending  4. Medications that need to be held: ASA 81, hold 7 days prior?    5. CAD: No  6. I will defer to:  Dr. Gillie Manners Information:   Dr. Sharma Covert [facility] Phone: [phone] Fax:  [phone]

## 2016-05-02 ENCOUNTER — Encounter: Payer: Self-pay | Admitting: Cardiovascular Disease

## 2016-05-02 NOTE — Telephone Encounter (Signed)
Letter faxed to [facility]

## 2016-05-02 NOTE — Telephone Encounter (Signed)
Can we fax my letter? Thanks, chris 

## 2016-05-09 ENCOUNTER — Ambulatory Visit (HOSPITAL_COMMUNITY): Payer: Medicare Other | Attending: Cardiovascular Disease

## 2016-05-09 ENCOUNTER — Other Ambulatory Visit: Payer: Self-pay

## 2016-05-09 ENCOUNTER — Ambulatory Visit (HOSPITAL_COMMUNITY): Payer: Medicare Other

## 2016-05-09 DIAGNOSIS — I340 Nonrheumatic mitral (valve) insufficiency: Secondary | ICD-10-CM | POA: Diagnosis not present

## 2016-05-09 DIAGNOSIS — I351 Nonrheumatic aortic (valve) insufficiency: Secondary | ICD-10-CM | POA: Insufficient documentation

## 2016-05-09 DIAGNOSIS — I361 Nonrheumatic tricuspid (valve) insufficiency: Secondary | ICD-10-CM | POA: Insufficient documentation

## 2016-05-09 DIAGNOSIS — I501 Left ventricular failure: Secondary | ICD-10-CM | POA: Diagnosis not present

## 2016-05-13 ENCOUNTER — Telehealth: Payer: Self-pay | Admitting: Cardiovascular Disease

## 2016-05-13 NOTE — Telephone Encounter (Signed)
The pt has been given his Echo results. He verbalized understanding. 

## 2016-05-13 NOTE — Telephone Encounter (Signed)
Follow Up:    Returning Pat's call from Friday.

## 2016-05-22 ENCOUNTER — Ambulatory Visit: Payer: Medicare Other | Admitting: Internal Medicine

## 2016-06-04 NOTE — Progress Notes (Signed)
Chief Complaint  Patient presents with  . Precordial pain    4-6 week follow up   History of Present Illness: 83 yo WM with history of PE, HLD, recurrent syncope secondary to vasovagal syndrome with a positive tilt test, thoracic aortic aneurysm, mild aortic valve insufficiency who is here today for cardiac follow up. He has a history of unprovoked pulmonary emboli diagnosed in July of 2008 and was on coumadin until this was stopped in August 2015 by Hematology. He does not have any known history of coronary artery disease. He had a normal stress echocardiogram in September of 2007 with his previous cardiologist. He had an episode of chest pain in March 2010 and saw Dr. Ladona Ridgel. Exercise stress test was normal. He has passed out easily his entire life and passes out after any procedure. 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 He was seen by Dr. Everlena Cooper with Neurology and also evaluated by Neurosurgery. No further workup planned. Dizziness felt to be due to vertigo. He was seen in the ED at [facility] 04/13/16 with left sided chest pain. EKG with no ischemic changes. He was felt to have pleurisy. CT chest negative per pt. Nuclear stress test without ischemia December 2017. Echo January 2018 with normal LV systolic function, mild AI, trivial MR.   He is here today for follow up. No chest pain or dyspnea. No LE edema, palpitations.   Primary Care Physician: Leandro Reasoner, MD  Past Medical History:  Diagnosis Date  . BPH (benign prostatic hyperplasia)   . Chest pain   . DVT (deep venous thrombosis) (HCC)   . GERD (gastroesophageal reflux disease)   . Hernia   . Hyperlipidemia   . Lung nodule   . Pulmonary embolism (HCC)   . Syncope     Past Surgical History:  Procedure Laterality Date  . EYE SURGERY  1957  . INGUINAL HERNIA REPAIR     x 3  . Left leg trauma      . LIPOMA RESECTION      Current Outpatient Prescriptions  Medication Sig Dispense Refill  . alfuzosin (UROXATRAL) 10 MG 24 hr tablet Take 10 mg by mouth daily.      [name] aspirin 81 MG tablet Take 81 mg by mouth daily.      . colesevelam (WELCHOL) 625 MG tablet Take 625 mg by mouth 4 (four) times daily.    . Cyanocobalamin (VITAMIN B12) 1000 MCG TBCR Take 1 tablet by mouth daily.    . Ergocalciferol (VITAMIN D2) 400 units TABS Take 1 tablet by mouth daily.    [name] esomeprazole (NEXIUM) 40 MG capsule Take 40 mg by mouth daily.      [name] ezetimibe (ZETIA) 10 MG tablet Take 1 tablet (10 mg total) by mouth daily. 30 tablet 11  . levocetirizine (XYZAL) 5 MG tablet Take 1 tablet by mouth daily.  0  . lubiprostone (AMITIZA) 8 MCG capsule Take 8 mcg by mouth daily.     . montelukast (SINGULAIR) 10 MG tablet Take 1 tablet by mouth at bedtime.  0   No current facility-administered medications for this visit.     Allergies  Allergen Reactions  . Anti-Oxidant     REACTION: intol gi upset  . Centrum     GI upset    Social History   Social History  . Marital  status: Married    Spouse name: N/A  . Number of children: 2  . Years of education: N/A   Occupational History  . Horticulturist, commercial for [employer], educational    Social History Main Topics  . Smoking status: Never Smoker  . Smokeless tobacco: Never Used  . Alcohol use No  . Drug use: No  . Sexual activity: Not on file   Other Topics Concern  . Not on file   Social History Narrative  . No narrative on file    Family History  Problem Relation Age of Onset  . Heart failure Father   . Cancer Mother     of eye-causing her to lose her eye  . Heart attack Father     x2    Review of Systems:  As stated in the HPI and otherwise negative.   BP 128/70   Pulse 82   Ht  (1.803 m)   Wt 184 lb 9.6 oz (83.7 kg)   BMI 25.75 kg/m   Physical Examination: General: Well developed, well nourished, NAD  HEENT: OP clear, mucus  membranes moist  SKIN: warm, dry. No rashes. Neuro: No focal deficits  Musculoskeletal: Muscle strength 5/5 all ext  Psychiatric: Mood and affect normal  Neck: No JVD, no carotid bruits, no thyromegaly, no lymphadenopathy.  Lungs:Clear bilaterally, no wheezes, rhonci, crackles Cardiovascular: Regular rate and rhythm. No murmurs, gallops or rubs. Abdomen:Soft. Bowel sounds present. Non-tender.  Extremities: No lower extremity edema. Pulses are 2 + in the bilateral DP/PT.  Echo January 2018: Left ventricle: The cavity size was normal. Systolic function was   normal. The estimated ejection fraction was in the range of 60%   to 65%. Wall motion was normal; there were no regional wall   motion abnormalities. Doppler parameters are consistent with   abnormal left ventricular relaxation (grade 1 diastolic   dysfunction). Doppler parameters are consistent with high   ventricular filling pressure. - Aortic valve: Transvalvular velocity was within the normal range.   There was no stenosis. There was mild regurgitation.   Regurgitation pressure half-time: 509 ms. - Mitral valve: There was trivial regurgitation. - Right ventricle: The cavity size was normal. Wall thickness was   normal. Systolic function was normal. - Tricuspid valve: There was trivial regurgitation.  Nuclear stress test December 2017:  Nuclear stress EF: 55%.  Blood pressure demonstrated a normal response to exercise.  There was no ST segment deviation noted during stress.  The study is normal.  This is a low risk study.  The left ventricular ejection fraction is normal (55-65%).  The patient was asymptomatic during the stress test.   Normal exercise nuclear stress test with no evidence of prior infarct or ischemia.  Good exercise capacity. Normal BP response to stress.   EKG:  EKG is not ordered today. The EKG demonstrates  Recent Labs: 03/13/2016: BUN 18; Creatinine, Ser 1.54; Potassium 4.2; Sodium 141      Wt Readings from Last 3 Encounters:  06/05/16 184 lb 9.6 oz (83.7 kg)  04/24/16 183 lb (83 kg)  04/19/16 183 lb 12.8 oz (83.4 kg)     Other studies Reviewed: Additional studies/ records that were reviewed today include: . Review of the above records demonstrates:   Assessment and Plan:   1.  Aortic regurgitation: Mild by echo 2018.   2. PE: Coumadin stopped by hematology. He is not felt to have a hypercoagulable state per pt report.   3. Hyperlipidemia: Followed in primary care.  4. Thoracic aortic aneurysm: Mild by echo 2018.   Current medicines are reviewed at length with the patient today.  The patient does not have concerns regarding medicines.  The following changes have been made:  no change  Labs/ tests ordered today include:   No orders of the defined types were placed in this encounter.    Disposition:   FU with me in 12 months   Signed, Verne Carrow, MD 06/05/2016 11:45 AM    [facility] [address] [place], [place], [place]  [address] Phone: [phone]; Fax: [phone]

## 2016-06-05 ENCOUNTER — Encounter: Payer: Self-pay | Admitting: Cardiovascular Disease

## 2016-06-05 ENCOUNTER — Ambulatory Visit (INDEPENDENT_AMBULATORY_CARE_PROVIDER_SITE_OTHER): Payer: Medicare Other | Admitting: Cardiovascular Disease

## 2016-06-05 VITALS — BP 128/70 | HR 82 | Ht 71.0 in | Wt 184.6 lb

## 2016-06-05 DIAGNOSIS — E7800 Pure hypercholesterolemia, unspecified: Secondary | ICD-10-CM

## 2016-06-05 DIAGNOSIS — I712 Thoracic aortic aneurysm, without rupture, unspecified: Secondary | ICD-10-CM

## 2016-06-05 DIAGNOSIS — I351 Nonrheumatic aortic (valve) insufficiency: Secondary | ICD-10-CM | POA: Diagnosis not present

## 2016-06-05 NOTE — Patient Instructions (Signed)

## 2016-07-12 ENCOUNTER — Encounter: Payer: Self-pay | Admitting: Cardiovascular Disease

## 2016-07-15 ENCOUNTER — Ambulatory Visit: Payer: Medicare Other | Admitting: Cardiovascular Disease

## 2016-07-15 ENCOUNTER — Telehealth: Payer: Self-pay | Admitting: *Deleted

## 2016-07-15 NOTE — Telephone Encounter (Signed)
I spoke with pt to let him know office is closing this afternoon due to weather. Pt reports he is currently on his way to his appointment this afternoon.  I offered to reschedule him to later this week but he feels he needs to be seen today due to recent diagnosis of atrial fibrillation.  He states he will go to ED.  He also wants it noted in his chart that he "sees no need to see Dr. Clifton James again."

## 2016-07-17 NOTE — Telephone Encounter (Signed)
It is unfortunate that [name] would choose to end our relationship due to his appointment being cancelled due to a weather closing. I will be happy to see him if he accepts our offer to reschedule his appointment.

## 2016-07-23 ENCOUNTER — Ambulatory Visit (INDEPENDENT_AMBULATORY_CARE_PROVIDER_SITE_OTHER): Payer: Medicare Other | Admitting: Cardiology

## 2016-07-23 ENCOUNTER — Encounter: Payer: Self-pay | Admitting: Cardiology

## 2016-07-23 VITALS — BP 112/68 | HR 84 | Ht 71.0 in | Wt 182.0 lb

## 2016-07-23 DIAGNOSIS — Z7901 Long term (current) use of anticoagulants: Secondary | ICD-10-CM

## 2016-07-23 DIAGNOSIS — I4891 Unspecified atrial fibrillation: Secondary | ICD-10-CM | POA: Diagnosis not present

## 2016-07-23 DIAGNOSIS — Z79899 Other long term (current) drug therapy: Secondary | ICD-10-CM

## 2016-07-23 DIAGNOSIS — I712 Thoracic aortic aneurysm, without rupture, unspecified: Secondary | ICD-10-CM

## 2016-07-23 DIAGNOSIS — E7800 Pure hypercholesterolemia, unspecified: Secondary | ICD-10-CM | POA: Diagnosis not present

## 2016-07-23 NOTE — Progress Notes (Signed)
Cardiology Office Note    Date:  07/23/2016   ID:  Justin, Daniels [birth year], MRN [id number]  PCP:  Leandro Reasoner, MD  Cardiologist:  Primary Dr Clifton James, today seen by Justin Alexander, MD   Chief complain: post hospitalization follow up for new onset atrial fibrillation  History of Present Illness:  [name] is a 83 y.o. male followed by Dr Clifton James for history of PE, HLD, recurrent syncope secondary to vasovagal syndrome with a positive tilt test, thoracic aortic aneurysm, mild aortic valve insufficiency. He has a history of unprovoked pulmonary emboli diagnosed in July of 2008 and was on coumadin until this was stopped in August 2015 by Hematology. He does not have any known history of coronary artery disease. He had a normal stress echocardiogram in September of 2007 with his previous cardiologist. He had an episode of chest pain in March 2010 and saw Dr. Ladona Ridgel. Exercise stress test was normal. He has passed out easily his entire life and passes out after any procedure. 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 He was seen by Dr. Everlena Cooper with Neurology and also evaluated by Neurosurgery. No further workup planned. Dizziness felt to be due to vertigo. He was seen in the ED at [facility] 04/13/16 with left sided chest pain. EKG with no ischemic changes. He was felt to have pleurisy. CT chest negative per pt. Nuclear stress test without ischemia December 2017. Echo January 2018 with normal LV systolic function, mild AI, trivial MR.  He was last seen by Dr Clifton James in January 2018 when he was asymptomatic and a follow up was scheduled for 1 year.  07/23/2016 - the patient presented to his PCP with neck pain and dizziness on 07/11/2016, he was sent to the the [facility] and diagnosed with new onset atrial fibrillation with controlled rate. While  in the ER he cardioverted spontaneously into the SR. He was prescribed Diltiazem 120 mg CD daily and Xarelto 15 mg po daily. He is tolerating it well and denies any further episodes of chest/neck pain, no palpitations, dizziness, syncope. No bleeding with Xarelto. He is asking if he can discontinue Xarelto. He was seen by Dr Alfonso Ellis - a cardiologist as a follow up on 07/18/16 and was in SR at the time.  He states that prior to this episode he has been dealing with prolonged sinusitis, low grade fevers, he was tested for flu and was negative.   Past Medical History:  Diagnosis Date  . BPH (benign prostatic hyperplasia)   . Chest pain   . DVT (deep venous thrombosis) (HCC)   . GERD (gastroesophageal reflux disease)   . Hernia   . Hyperlipidemia   . Lung nodule   . Pulmonary embolism (HCC)   . Syncope     Past Surgical History:  Procedure Laterality Date  . EYE SURGERY  1957  . INGUINAL HERNIA REPAIR     x 3  . Left leg trauma    . LIPOMA RESECTION      Current Medications: Outpatient Medications Prior to Visit  Medication Sig Dispense Refill  . alfuzosin (UROXATRAL) 10 MG 24 hr tablet Take 10 mg by mouth daily.      [name] aspirin 81 MG tablet Take 81 mg by mouth daily.      . colesevelam (WELCHOL) 625 MG tablet Take 625  mg by mouth 4 (four) times daily.    . Cyanocobalamin (VITAMIN B12) 1000 MCG TBCR Take 1 tablet by mouth daily.    . Ergocalciferol (VITAMIN D2) 400 units TABS Take 1 tablet by mouth daily.    [name] esomeprazole (NEXIUM) 40 MG capsule Take 40 mg by mouth daily.      [name] ezetimibe (ZETIA) 10 MG tablet Take 1 tablet (10 mg total) by mouth daily. 30 tablet 11  . levocetirizine (XYZAL) 5 MG tablet Take 1 tablet by mouth daily.  0  . lubiprostone (AMITIZA) 8 MCG capsule Take 8 mcg by mouth daily.     . montelukast (SINGULAIR) 10 MG tablet Take 1 tablet by mouth at bedtime.  0   No facility-administered medications prior to visit.      Allergies:   Anti-oxidant and Centrum    Social History   Social History  . Marital status: Married    Spouse name: N/A  . Number of children: 2  . Years of education: N/A   Occupational History  . Horticulturist, commercial for [employer], educational    Social History Main Topics  . Smoking status: Never Smoker  . Smokeless tobacco: Never Used  . Alcohol use No  . Drug use: No  . Sexual activity: Not Asked   Other Topics Concern  . None   Social History Narrative  . None     Family History:  The patient's family history includes Cancer in his mother; Heart attack in his father; Heart failure in his father.   ROS:   Please see the history of present illness.    ROS All other systems reviewed and are negative.   PHYSICAL EXAM:   VS:  BP 112/68   Pulse 84   Ht 5\' 11"  (1.803 m)   Wt 182 lb (82.6 kg)   SpO2 96%   BMI 25.38 kg/m    GEN: Well nourished, well developed, in no acute distress  HEENT: normal  Neck: no JVD, carotid bruits, or masses Cardiac: RRR; no murmurs, rubs, or gallops,no edema  Respiratory:  clear to auscultation bilaterally, normal work of breathing GI: soft, nontender, nondistended, + BS MS: no deformity or atrophy  Skin: warm and dry, no rash Neuro:  Alert and Oriented x 3, Strength and sensation are intact Psych: euthymic mood, full affect  Wt Readings from Last 3 Encounters:  07/23/16 182 lb (82.6 kg)  06/05/16 184 lb 9.6 oz (83.7 kg)  04/24/16 183 lb (83 kg)      Studies/Labs Reviewed:   EKG:  EKG is ordered today.  The ekg ordered today was personally reviewed and it shows   Recent Labs: 03/13/2016: BUN 18; Creatinine, Ser 1.54; Potassium 4.2; Sodium 141   Lipid Panel    Component Value Date/Time   CHOL  11/28/2006 0330    84        ATP III CLASSIFICATION:  <200     mg/dL   Desirable  161-096  mg/dL   Borderline High  >=045    mg/dL   High   TRIG 94 40/98/1191 0330   HDL 18 (L) 11/28/2006 0330   CHOLHDL 4.7 11/28/2006 0330   VLDL 19 11/28/2006 0330   LDLCALC   11/28/2006 0330    47        Total Cholesterol/HDL:CHD Risk Coronary Heart Disease Risk Table                     Men   Women  1/2 Average Risk   3.4   3.3    Additional studies/ records that were reviewed today include:   TTE: 05/09/2016 - Left ventricle: The cavity size was normal. Systolic function was   normal. The estimated ejection fraction was in the range of 60%   to 65%. Wall motion was normal; there were no regional wall   motion abnormalities. Doppler parameters are consistent with   abnormal left ventricular relaxation (grade 1 diastolic   dysfunction). Doppler parameters are consistent with high   ventricular filling pressure. - Aortic valve: Transvalvular velocity was within the normal range.   There was no stenosis. There was mild regurgitation.   Regurgitation pressure half-time: 509 ms. - Mitral valve: There was trivial regurgitation. - Right ventricle: The cavity size was normal. Wall thickness was   normal. Systolic function was normal. - Tricuspid valve: There was trivial regurgitation.    ASSESSMENT:    1. New onset a-fib (HCC)   2. Encounter for long-term current use of high risk medication   3. Current use of long term anticoagulation   4. Pure hypercholesterolemia   5. Thoracic aortic aneurysm without rupture (HCC)     PLAN:  In order of problems listed above:  1. New onset a-fib - self terminated and possibly sec to an acute resp infection. I have personally reviewed his echo from 05/2016 and he has normal normal LVEF, trivial MR and normal LA size. However his CHADS_VASc is 15 (age, h/o thromboembolism). I will start a 2 week event monitor to see if we can detect any a-fib and if none and the patient remains asymptomatic we will consider discontinuation of Xarelto 3 months post a-fib event.    2. Aortic regurgitation: Mild by echo 2018.   3. PE: Coumadin stopped by hematology in 2015.  4.  Hyperlipidemia: Followed in primary care.   5. Thoracic  aortic aneurysm: Mild by echo 2018.   Medication Adjustments/Labs and Tests Ordered: Current medicines are reviewed at length with the patient today.  Concerns regarding medicines are outlined above.  Medication changes, Labs and Tests ordered today are listed in the Patient Instructions below. Patient Instructions  Medication Instructions:   Your physician recommends that you continue on your current medications as directed. Please refer to the Current Medication list given to you today.    Labwork:  PRIOR TOO YOUR 2 MONTH FOLLOW-UP APPOINTMENT WITH DR. Clifton James TO CHECK---BMET AND CBC W DIFF     Testing/Procedures:  Your physician has recommended that you wear an event monitor. Event monitors are medical devices that record the heart's electrical activity. Doctors most often [place] these monitors to diagnose arrhythmias. Arrhythmias are problems with the speed or rhythm of the heartbeat. The monitor is a small, portable device. You can wear one while you do your normal daily activities. This is usually used to diagnose what is causing palpitations/syncope (passing out).    Follow-Up:  2 MONTHS WITH DR. Clifton James WITH LABS DONE A WEEK PRIOR TOO THIS APPOINTMENT      If you need a refill on your cardiac medications before your next appointment, please call your pharmacy.      Signed, Justin Alexander, MD  07/23/2016 9:46 PM    [facility] [address] [place], [place], [place]  [address] Phone: [phone]; Fax: [phone]

## 2016-07-23 NOTE — Patient Instructions (Signed)
Medication Instructions:   Your physician recommends that you continue on your current medications as directed. Please refer to the Current Medication list given to you today.    Labwork:  PRIOR TOO YOUR 2 MONTH FOLLOW-UP APPOINTMENT WITH DR. Clifton James TO CHECK---BMET AND CBC W DIFF     Testing/Procedures:  Your physician has recommended that you wear an event monitor. Event monitors are medical devices that record the heart's electrical activity. Doctors most often [place] these monitors to diagnose arrhythmias. Arrhythmias are problems with the speed or rhythm of the heartbeat. The monitor is a small, portable device. You can wear one while you do your normal daily activities. This is usually used to diagnose what is causing palpitations/syncope (passing out).    Follow-Up:  2 MONTHS WITH DR. Clifton James WITH LABS DONE A WEEK PRIOR TOO THIS APPOINTMENT      If you need a refill on your cardiac medications before your next appointment, please call your pharmacy.

## 2016-07-31 ENCOUNTER — Ambulatory Visit (INDEPENDENT_AMBULATORY_CARE_PROVIDER_SITE_OTHER): Payer: Medicare Other

## 2016-07-31 DIAGNOSIS — I4891 Unspecified atrial fibrillation: Secondary | ICD-10-CM

## 2016-08-06 ENCOUNTER — Other Ambulatory Visit: Payer: Self-pay | Admitting: *Deleted

## 2016-08-06 MED ORDER — RIVAROXABAN 15 MG PO TABS: 15.0000 mg | ORAL_TABLET | Freq: Every day | ORAL | 3 refills | Status: DC

## 2016-08-06 MED ORDER — DILTIAZEM HCL ER COATED BEADS 120 MG PO CP24: 120.0000 mg | ORAL_CAPSULE | Freq: Every day | ORAL | 3 refills | Status: DC

## 2016-08-14 ENCOUNTER — Telehealth: Payer: Self-pay | Admitting: Physician Assistant

## 2016-08-14 NOTE — Telephone Encounter (Signed)
Patient of Dr. Delton See who had PAF in the setting of acute resp infection. She was placed on 2 weeks event monitor to see if there is any recurrence. If no recurrent, she might come off Xarelto after 3 months.   Received paged from [employer], around 2:30 PM Central Standard Time today, she had atrial fibrillation. By next transmission around 5PM, her transmission to sinus rhythm was PACs. Therefore given her high CHA2DS2-Vasc score, likely chronic anticoagulation instead. Will inform Dr. Delton See.   Ramond Dial PA Pager: [phone]

## 2016-08-15 NOTE — Telephone Encounter (Signed)
[name], are you talking about two different patients? First you mentioned a male with no a-fib recurrence and then a male with a-fib episode. K

## 2016-08-16 NOTE — Telephone Encounter (Signed)
I apologize, I should have typed he not she. I have called [employer] again [phone]. It is Mr. [name] who had recurrent afib. They will fax strips to office to verify

## 2016-09-19 ENCOUNTER — Other Ambulatory Visit: Payer: Self-pay | Admitting: Cardiovascular Disease

## 2016-09-19 ENCOUNTER — Telehealth: Payer: Self-pay

## 2016-09-19 ENCOUNTER — Telehealth: Payer: Self-pay | Admitting: Cardiovascular Disease

## 2016-09-19 NOTE — Telephone Encounter (Signed)
Message received from Charlotte Crumb stating patient wanted to speak with clinic manager. Writer contacted patient who verbalized "frustration" with the [facility] system being "too large, and too busy."  Patient stated he was hospitalized 6 weeks ago and was unable to get an appt with his cardiologist.  States he was able to see Dr Delton See.  When asked stated he did not want to see an APP, he has a doctor and that is who he needs to see. Patient asked for "the head of [facility],"  Georgie Chard name was provided.  He complimented the MDs but reiterated "your office is just too busy."  Jim Like [facility] RN CCM

## 2016-09-19 NOTE — Telephone Encounter (Signed)
Follow up   Pt is calling to give [name] the fax number to his [facility].  Fax number [phone]

## 2016-09-19 NOTE — Telephone Encounter (Signed)
I spoke with pt and told him he would need CBC and BMP. He is going to have this done at [facility] in [facility] .  I told him I would fax order to lab. Pt would like  copy of monitor results when here to see Dr. Clifton James on 5/23. Will ask medical records to assist with this. Fax number for [facility] is [phone]. Orders faxed.

## 2016-09-19 NOTE — Telephone Encounter (Signed)
New message      Pt has an appt on 09-25-16 with Dr Clifton James.  Calling to see if he need to have labs drawn prior to appt.  Please call and let pt know.  He is out of town and will have labs drawn at a [facility] facility there but he cannot find fax number at this time.  Hope to have fax number when you call.

## 2016-09-19 NOTE — Telephone Encounter (Signed)
Will need BMP and CBC prior to appointment. I placed call to pt and left message to call back

## 2016-09-20 LAB — CBC WITH DIFFERENTIAL/PLATELET
BASOS ABS: 0.1 10*3/uL (ref 0.0–0.2)
Basos: 1 %
EOS (ABSOLUTE): 0.1 10*3/uL (ref 0.0–0.4)
EOS: 2 %
Hematocrit: 44.7 % (ref 37.5–51.0)
Hemoglobin: 15.2 g/dL (ref 13.0–17.7)
IMMATURE GRANS (ABS): 0 10*3/uL (ref 0.0–0.1)
IMMATURE GRANULOCYTES: 1 %
Lymphocytes Absolute: 2.9 10*3/uL (ref 0.7–3.1)
Lymphs: 43 %
MCH: 32.3 pg (ref 26.6–33.0)
MCHC: 34 g/dL (ref 31.5–35.7)
MCV: 95 fL (ref 79–97)
MONOCYTES: 14 %
MONOS ABS: 0.9 10*3/uL (ref 0.1–0.9)
NEUTROS ABS: 2.6 10*3/uL (ref 1.4–7.0)
Neutrophils: 39 %
PLATELETS: 246 10*3/uL (ref 150–379)
RBC: 4.71 x10E6/uL (ref 4.14–5.80)
RDW: 14.1 % (ref 12.3–15.4)
WBC: 6.5 10*3/uL (ref 3.4–10.8)

## 2016-09-20 LAB — BASIC METABOLIC PANEL
BUN/Creatinine Ratio: 14 (ref 10–24)
BUN: 20 mg/dL (ref 8–27)
CALCIUM: 9.4 mg/dL (ref 8.6–10.2)
CHLORIDE: 99 mmol/L (ref 96–106)
CO2: 23 mmol/L (ref 18–29)
Creatinine, Ser: 1.47 mg/dL — ABNORMAL HIGH (ref 0.76–1.27)
GFR calc non Af Amer: 44 mL/min/{1.73_m2} — ABNORMAL LOW (ref >59)
GFR, EST AFRICAN AMERICAN: 51 mL/min/{1.73_m2} — AB (ref >59)
GLUCOSE: 103 mg/dL — AB (ref 65–99)
Potassium: 4.8 mmol/L (ref 3.5–5.2)
Sodium: 140 mmol/L (ref 134–144)

## 2016-09-25 ENCOUNTER — Encounter: Payer: Self-pay | Admitting: Cardiovascular Disease

## 2016-09-25 ENCOUNTER — Ambulatory Visit (INDEPENDENT_AMBULATORY_CARE_PROVIDER_SITE_OTHER): Payer: Medicare Other | Admitting: Cardiovascular Disease

## 2016-09-25 VITALS — BP 120/68 | HR 88 | Ht 71.0 in | Wt 182.4 lb

## 2016-09-25 DIAGNOSIS — I351 Nonrheumatic aortic (valve) insufficiency: Secondary | ICD-10-CM | POA: Diagnosis not present

## 2016-09-25 DIAGNOSIS — I712 Thoracic aortic aneurysm, without rupture, unspecified: Secondary | ICD-10-CM

## 2016-09-25 DIAGNOSIS — E7800 Pure hypercholesterolemia, unspecified: Secondary | ICD-10-CM | POA: Diagnosis not present

## 2016-09-25 DIAGNOSIS — I480 Paroxysmal atrial fibrillation: Secondary | ICD-10-CM

## 2016-09-25 NOTE — Progress Notes (Signed)
Chief Complaint  Patient presents with  . Follow-up    atrial fib   History of Present Illness: 83 yo male with atrial fibrillation, prior PE, HLD, vasovagal syncope thoracic aortic aneurysm and mild aortic valve insufficiency who is here today for cardiac follow up. He has a history of unprovoked pulmonary emboli diagnosed in July of 2008 and was on coumadin until this was stopped in August 2015 by Hematology. He is not known to have CAD. Nuclear stress test without ischemia December 2017. Echo January 2018 with normal LV systolic function, mild AI, trivial MR. He has passed out easily his entire life and passes out after any procedure. 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 He was seen by Dr. Everlena Cooper with Neurology and also evaluated by Neurosurgery. No further workup planned. Dizziness felt to be due to vertigo. He was seen in our office 07/23/16 by Dr. Delton See. He was diagnosed with atrial fibrillation 07/11/16 at [facility] following episode of neck pain and dizziness. He converted to sinus in their ED. He was started on Cardizem and Xarelto. He was found to have brief runs of atrial fib on cardiac monitor April 2018 so he was continued on Xarelto.   He is here today for follow up. The patient denies any chest pain, dyspnea, palpitations, lower extremity edema, orthopnea, PND, dizziness, near syncope or syncope.   Primary Care Physician: Leandro Reasoner, MD  Past Medical History:  Diagnosis Date  . BPH (benign prostatic hyperplasia)   . Chest pain   . DVT (deep venous thrombosis) (HCC)   . GERD (gastroesophageal reflux disease)   . Hernia   . Hyperlipidemia   . Lung nodule   . Pulmonary embolism (HCC)   . Syncope     Past Surgical History:  Procedure Laterality Date  . EYE SURGERY  1957  . INGUINAL HERNIA REPAIR     x 3  . Left leg trauma    . LIPOMA  RESECTION      Current Outpatient Prescriptions  Medication Sig Dispense Refill  . alfuzosin (UROXATRAL) 10 MG 24 hr tablet Take 10 mg by mouth daily.      [name] aspirin 81 MG tablet Take 81 mg by mouth daily.      . Cholecalciferol (VITAMIN D3) 2000 units capsule Take 2,000 Units by mouth daily.    . colesevelam (WELCHOL) 625 MG tablet Take 625 mg by mouth 4 (four) times daily.    . Cyanocobalamin (VITAMIN B12) 1000 MCG TBCR Take 1 tablet by mouth daily.    [name] diltiazem (CARDIZEM CD) 120 MG 24 hr capsule Take 1 capsule (120 mg total) by mouth daily. 90 capsule 3  . Ergocalciferol (VITAMIN D2) 400 units TABS Take 1 tablet by mouth daily.    [name] esomeprazole (NEXIUM) 40 MG capsule Take 40 mg by mouth daily.      [name] ezetimibe (ZETIA) 10 MG tablet Take 1 tablet (10 mg total) by mouth daily. 30 tablet 11  . lubiprostone (AMITIZA) 8 MCG capsule Take 8 mcg by mouth daily.     . methylPREDNISolone (MEDROL DOSEPAK) 4 MG TBPK tablet Take 4 mg by mouth daily. 5 DAYS    . Rivaroxaban (XARELTO) 15 MG TABS tablet Take 1 tablet (15 mg total) by mouth daily. 90 tablet 3   No current facility-administered medications for this visit.  Allergies  Allergen Reactions  . Anti-Oxidant     REACTION: intol gi upset  . Centrum     GI upset  . Latex Rash    Social History   Social History  . Marital status: Married    Spouse name: N/A  . Number of children: 2  . Years of education: N/A   Occupational History  . Horticulturist, commercial for [employer], educational    Social History Main Topics  . Smoking status: Never Smoker  . Smokeless tobacco: Never Used  . Alcohol use No  . Drug use: No  . Sexual activity: Not on file   Other Topics Concern  . Not on file   Social History Narrative  . No narrative on file    Family History  Problem Relation Age of Onset  . Heart failure Father   . Heart attack Father        x2  . Cancer Mother        of eye-causing her to lose her eye    Review of  Systems:  As stated in the HPI and otherwise negative.   BP 120/68   Pulse 88   Ht 5\' 11"  (1.803 m)   Wt 182 lb 6.4 oz (82.7 kg)   SpO2 96%   BMI 25.44 kg/m   Physical Examination:  General: Well developed, well nourished, NAD  HEENT: OP clear, mucus membranes moist  SKIN: warm, dry. No rashes. Neuro: No focal deficits  Musculoskeletal: Muscle strength 5/5 all ext  Psychiatric: Mood and affect normal  Neck: No JVD, no carotid bruits, no thyromegaly, no lymphadenopathy.  Lungs:Clear bilaterally, no wheezes, rhonci, crackles Cardiovascular: Regular rate and rhythm. No murmurs, gallops or rubs. Abdomen:Soft. Bowel sounds present. Non-tender.  Extremities: No lower extremity edema. Pulses are 2 + in the bilateral DP/PT.  Echo January 2018: Left ventricle: The cavity size was normal. Systolic function was   normal. The estimated ejection fraction was in the range of 60%   to 65%. Wall motion was normal; there were no regional wall   motion abnormalities. Doppler parameters are consistent with   abnormal left ventricular relaxation (grade 1 diastolic   dysfunction). Doppler parameters are consistent with high   ventricular filling pressure. - Aortic valve: Transvalvular velocity was within the normal range.   There was no stenosis. There was mild regurgitation.   Regurgitation pressure half-time: 509 ms. - Mitral valve: There was trivial regurgitation. - Right ventricle: The cavity size was normal. Wall thickness was   normal. Systolic function was normal. - Tricuspid valve: There was trivial regurgitation.  Nuclear stress test December 2017:  Nuclear stress EF: 55%.  Blood pressure demonstrated a normal response to exercise.  There was no ST segment deviation noted during stress.  The study is normal.  This is a low risk study.  The left ventricular ejection fraction is normal (55-65%).  The patient was asymptomatic during the stress test.   Normal exercise nuclear  stress test with no evidence of prior infarct or ischemia.  Good exercise capacity. Normal BP response to stress.   EKG:  EKG is not ordered today. The EKG demonstrates  Recent Labs: 09/19/2016: BUN 20; Creatinine, Ser 1.47; Platelets 246; Potassium 4.8; Sodium 140    Wt Readings from Last 3 Encounters:  09/25/16 182 lb 6.4 oz (82.7 kg)  07/23/16 182 lb (82.6 kg)  06/05/16 184 lb 9.6 oz (83.7 kg)     Other studies Reviewed: Additional studies/ records that were reviewed  today include: . Review of the above records demonstrates:   Assessment and Plan:   1.  Aortic regurgitation: Mild AI by echo January 2018.  2. Atrial fibrillation, paroxysmal: Sinus today. Will continue Cardizem and Xarelto. Long discussion today regarding atrial fib and treatment options. He feels tired when he is in atrial fib. He will buy the AliveCor monitor for his Iphone.   3. Hyperlipidemia: Followed by primary care.   4. Thoracic aortic aneurysm: Mild by echo in January 2018  Current medicines are reviewed at length with the patient today.  The patient does not have concerns regarding medicines.  The following changes have been made:  no change  Labs/ tests ordered today include:   No orders of the defined types were placed in this encounter.    Disposition:   FU with me in 12 months   Signed, Verne Carrow, MD 09/25/2016 3:32 PM    [facility] [address] [place], [place], [place]  [address] Phone: [phone]; Fax: [phone]

## 2016-09-25 NOTE — Patient Instructions (Signed)
Medication Instructions:  Your physician recommends that you continue on your current medications as directed. Please refer to the Current Medication list given to you today.   Labwork: none  Testing/Procedures: none  Follow-Up: Your physician recommends that you schedule a follow-up appointment in: 6 months. Please call our office in August to schedule this appointment    Any Other Special Instructions Will Be Listed Below (If Applicable).     If you need a refill on your cardiac medications before your next appointment, please call your pharmacy.   

## 2016-10-01 ENCOUNTER — Telehealth: Payer: Self-pay | Admitting: Cardiovascular Disease

## 2016-10-01 NOTE — Telephone Encounter (Signed)
I spoke with pt who reports he was told he may go in and out of atrial fibrillation.  He is asking what to do if he goes into afib.   I told him as long as heart rate was under control and he was not having any symptoms such as dizziness or feeling as if he may pass out there was nothing specific he needed to do.  I explained to him the importance of taking all medications as ordered. Recent office note indicates he feels tired when in atrial fib and I told him to let us know if he feels fatigued.  He is thinking about buying an Alive cor monitor. I told pt if heart rate stays above 100 consistently he should let us know.  Pt reports Xarelto 15 mg was started at hospital in [place].  Pt states this was based on kidney function.  Pt requests chart be reviewed to make sure Xarelto 15 mg is correct dose.  Will ask PharmD to review.

## 2016-10-01 NOTE — Telephone Encounter (Signed)
Patient calling, states that he saw Dr. Clifton James last week and talked about being in and out of AFIB.  Patient would like to ask " If I know I'm in AFIB what do I do? How do I react?"   Please call, thanks.

## 2016-10-02 NOTE — Telephone Encounter (Signed)
Left message for pt to call office

## 2016-10-02 NOTE — Telephone Encounter (Signed)
CrCl is 7mL/min, pt is on correct dose of Xarelto 15mg  daily.

## 2016-10-03 NOTE — Telephone Encounter (Signed)
I spoke with pt and gave him information from pharmacist 

## 2017-01-28 ENCOUNTER — Telehealth: Payer: Self-pay | Admitting: Cardiovascular Disease

## 2017-01-28 NOTE — Telephone Encounter (Signed)
Pt is having right hand surgery at [facility] on 02/04/17 . Pt  would like to know if he needs to stop the Xarelto 15 mg prior the procedure. Pt is aware that I will send this message to MD for recommendations.

## 2017-01-28 NOTE — Telephone Encounter (Signed)
He should stop Xarelto 3 days prior to the surgery. Thanks, chris

## 2017-01-28 NOTE — Telephone Encounter (Signed)
New message   Per patient call wants nurse to review. 1. Hand surgery- [facility] 2. Scheduled 10/2 3.Rivaroxaban (XARELTO) 15 MG TABS tablet(Expired)

## 2017-01-28 NOTE — Telephone Encounter (Signed)
Advised patient and will send to Dr Orlan Leavens per patient request

## 2017-01-31 ENCOUNTER — Telehealth: Payer: Self-pay | Admitting: Cardiovascular Disease

## 2017-01-31 NOTE — Telephone Encounter (Signed)
I spoke with pt and told him Dr. Clifton James has given approval for him to stop Xarelto 3 days prior to hand surgery.  Pt reports he has spoken with Dr. Bari Edward office and been told he does not need to stop Xarelto.  I told pt he should then follow instructions from surgeon doing procedure.  Pt reports he may cancel surgery.  I told pt to have Dr. Bari Edward office contact us if they have any questions.

## 2017-01-31 NOTE — Telephone Encounter (Signed)
Follow up ° ° ° ° ° °Returning a call to the nurse °

## 2017-01-31 NOTE — Telephone Encounter (Signed)
New message    Pt is trying to call in surgical clearance for confirmation on stopping Xarelto ?   Spoke w triage and they gave him instructions and he needs to clarify

## 2017-01-31 NOTE — Telephone Encounter (Signed)
Left message to call back  

## 2017-03-24 ENCOUNTER — Encounter: Payer: Self-pay | Admitting: Physician Assistant

## 2017-03-24 DIAGNOSIS — N183 Chronic kidney disease, stage 3 unspecified: Secondary | ICD-10-CM | POA: Insufficient documentation

## 2017-03-24 DIAGNOSIS — I351 Nonrheumatic aortic (valve) insufficiency: Secondary | ICD-10-CM | POA: Insufficient documentation

## 2017-03-24 DIAGNOSIS — I77810 Thoracic aortic ectasia: Secondary | ICD-10-CM | POA: Insufficient documentation

## 2017-03-24 DIAGNOSIS — I480 Paroxysmal atrial fibrillation: Secondary | ICD-10-CM | POA: Insufficient documentation

## 2017-03-24 NOTE — Progress Notes (Deleted)
Cardiology Office Note    Date:  03/24/2017  ID:  [name], DOB [birth year], MRN [id number] PCP:  Leandro Reasoner, MD  Cardiologist:  Dr. Clifton James   Chief Complaint: f/u atrial fib  History of Present Illness:  [name] is a 83 y.o. male with history of paroxysmal atrial fibrillation, prior PE, HLD, vasovagal syncope, CKD stage III, dilated ascending aorta and mild aortic valve insufficiency who presents for 6 month follow-up. He has a history of unprovoked pulmonary emboli diagnosed in July of 2008 and was on Coumadin until this was stopped in August 2015 by Hematology. 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 He was seen by Dr. Everlena Cooper with Neurology and also evaluated by Neurosurgery. No further workup planned. Dizziness felt to be due to vertigo. He was diagnosed with new onset atrial fibrillation in 07/2016 with event monitor 08/2016 confirming further paroxysms, so he has been maintained on Cardizem and Xarelto. Last echo 05/2016 showed EF 60-65%, grade 1 DD, mild AI, normal RV - this echo does not specifically allude to aortic dilitation; Dr. Clifton James called this "mild" in last note. Prior nuc in 04/2016 was normal (no known history of coronary disease). Last labs 09/2016 showed Cr 1.47 (similar to prior), Hgb 15.2; remote TSH 2015 wnl. AliveCor recommended at last OV.  echo 05/2017 Chol/?  Paroxysmal atrial fibrillation Dilated thoracic aorta Aortic insufficiency CKD stage III     Past Medical History:  Diagnosis Date  . BPH (benign prostatic hyperplasia)   . CKD (chronic kidney disease), stage III (HCC)   . DVT (deep venous thrombosis) (HCC)   . GERD (gastroesophageal reflux disease)   . Hernia   . Hyperlipidemia   . Lung nodule   . Mild aortic insufficiency   . Mild dilation of ascending aorta (HCC)   . PAF (paroxysmal atrial fibrillation)  (HCC)   . Pulmonary embolism (HCC)   . Syncope     Past Surgical History:  Procedure Laterality Date  . EYE SURGERY  1957  . INGUINAL HERNIA REPAIR     x 3  . Left leg trauma    . LIPOMA RESECTION      Current Medications: No outpatient medications have been marked as taking for the 03/25/17 encounter (Appointment) with Laurann Montana, PA-C.     Allergies:   Anti-oxidant; Centrum; and Latex   Social History   Socioeconomic History  . Marital status: Married    Spouse name: Not on file  . Number of children: 2  . Years of education: Not on file  . Highest education level: Not on file  Social Needs  . Financial resource strain: Not on file  . Food insecurity - worry: Not on file  . Food insecurity - inability: Not on file  . Transportation needs - medical: Not on file  . Transportation needs - non-medical: Not on file  Occupational History  . Occupation: Research officer, political party  Tobacco Use  . Smoking status: Never Smoker  . Smokeless tobacco: Never Used  Substance and Sexual Activity  . Alcohol use: No  . Drug use: No  . Sexual activity: Not on file  Other Topics Concern  . Not on file  Social History Narrative  . Not on file     Family History:  Family History  Problem Relation Age of Onset  .  Heart failure Father   . Heart attack Father        x2  . Cancer Mother        of eye-causing her to lose her eye   ***  ROS:   Please see the history of present illness. Otherwise, review of systems is positive for ***.  All other systems are reviewed and otherwise negative.    PHYSICAL EXAM:   VS:  There were no vitals taken for this visit.  BMI: There is no height or weight on file to calculate BMI. GEN: Well nourished, well developed, in no acute distress  HEENT: normocephalic, atraumatic Neck: no JVD, carotid bruits, or masses Cardiac: ***RRR; no murmurs, rubs, or gallops, no edema  Respiratory:  clear to auscultation bilaterally,  normal work of breathing GI: soft, nontender, nondistended, + BS MS: no deformity or atrophy  Skin: warm and dry, no rash Neuro:  Alert and Oriented x 3, Strength and sensation are intact, follows commands Psych: euthymic mood, full affect  Wt Readings from Last 3 Encounters:  09/25/16 182 lb 6.4 oz (82.7 kg)  07/23/16 182 lb (82.6 kg)  06/05/16 184 lb 9.6 oz (83.7 kg)      Studies/Labs Reviewed:   EKG:  EKG was ordered today and personally reviewed by me and demonstrates *** EKG was not ordered today.***  Recent Labs: 09/19/2016: BUN 20; Creatinine, Ser 1.47; Hemoglobin 15.2; Platelets 246; Potassium 4.8; Sodium 140   Lipid Panel    Component Value Date/Time   CHOL  11/28/2006 0330    84        ATP III CLASSIFICATION:  <200     mg/dL   Desirable  [id number]  mg/dL   Borderline High  >=528>=240    mg/dL   High   TRIG 94 41/32/440107/25/2008 0330   HDL 18 (L) 11/28/2006 0330   CHOLHDL 4.7 11/28/2006 0330   VLDL 19 11/28/2006 0330   LDLCALC  11/28/2006 0330    47        Total Cholesterol/HDL:CHD Risk Coronary Heart Disease Risk Table                     Men   Women  1/2 Average Risk   3.4   3.3    Additional studies/ records that were reviewed today include: Summarized above.***    ASSESSMENT & PLAN:   1. ***  Disposition: F/u with ***   Medication Adjustments/Labs and Tests Ordered: Current medicines are reviewed at length with the patient today.  Concerns regarding medicines are outlined above. Medication changes, Labs and Tests ordered today are summarized above and listed in the Patient Instructions accessible in Encounters.   Signed, Laurann Montanaayna N Dunn, PA-C  03/24/2017 1:26 PM    [facility] [address], [place], [place]  [address] Phone: [phone]; Fax: [phone]

## 2017-03-25 ENCOUNTER — Ambulatory Visit: Payer: Medicare Other | Admitting: Physician Assistant

## 2017-03-25 NOTE — Progress Notes (Signed)
Cardiology Office Note    Date:  03/26/2017  ID:  Justin PandyCharles W Hindle, DOB [birth year], MRN [id number] PCP:  Leandro Reasonerhakkar, Nehal P, MD  Cardiologist: Dr. Clifton JamesMcAlhany   Chief Complaint: 6 month followup of atrial fib  History of Present Illness:  [name] is a 83 y.o. male with history of paroxysmal atrial fibrillation, prior PE, HLD, vasovagal syncope, CKD stage III, dilated ascending aorta, RBBB, and mild aortic valve insufficiency who presents for 6 month follow-up. He has a history of unprovoked pulmonary emboli diagnosed in July of 2008 and was on Coumadin until this was stopped in August 2015 by Hematology. 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 He was seen by Dr. Everlena CooperJaffe with Neurology and also evaluated by Neurosurgery. No further workup planned. Dizziness felt to be due to vertigo. He was diagnosed with new onset atrial fibrillation in 07/2016 with event monitor 08/2016 confirming further paroxysms, so he has been maintained on Cardizem and Xarelto. Last echo 05/2016 showed EF 60-65%, grade 1 DD, mild AI, normal RV - this echo does not specifically allude to aortic dilitation; Dr. Clifton JamesMcAlhany called this "mild" in last note. Prior nuc in 04/2016 was normal (no known history of coronary disease). Last labs 09/2016 showed Cr 1.47 (similar to prior), Hgb 15.2; remote TSH 2015 wnl. [employer] recommended at last OV.  He returns for follow-up today by himself. He expresses frustration over never being able to see Dr. Clifton JamesMcAlhany himself. He feels that healthcare has changed so that he never sees his own physician but instead an extender. From a cardiac standpoint, however, he is doing great. No interim palpitations, chest pain, dyspnea or fatigue. [facility]. He also enjoys traveling across country in his RV with his wife. His son lost his home in  [place] to the fires last year and moved to [place]. Lipids followed by PCP. He brings in labs with last LDL of 76 and Cr of 1.48 in 01/2017.    Past Medical History:  Diagnosis Date  . BPH (benign prostatic hyperplasia)   . CKD (chronic kidney disease), stage III (HCC)   . DVT (deep venous thrombosis) (HCC)   . GERD (gastroesophageal reflux disease)   . Hernia   . Hyperlipidemia   . Lung nodule   . Mild aortic insufficiency   . Mild dilation of ascending aorta (HCC)   . PAF (paroxysmal atrial fibrillation) (HCC)   . Pulmonary embolism (HCC)   . Syncope     Past Surgical History:  Procedure Laterality Date  . EYE SURGERY  1957  . INGUINAL HERNIA REPAIR     x 3  . Left leg trauma    . LIPOMA RESECTION      Current Medications: Current Meds  Medication Sig  . aspirin 81 MG tablet Take 81 mg by mouth daily.    . Cholecalciferol (VITAMIN D3) 2000 units capsule Take 2,000 Units by mouth daily.  . colesevelam (WELCHOL) 625 MG tablet Take 625 mg by mouth 4 (four) times daily.  . Cyanocobalamin (VITAMIN B12) 1000 MCG TBCR Take 1 tablet by mouth daily.  [name]. diltiazem (CARDIZEM CD) 120 MG 24 hr capsule Take 120 mg by mouth.  . Ergocalciferol (VITAMIN D2) 400 units TABS Take 1 tablet by mouth daily.  [name]. esomeprazole (NEXIUM) 40 MG capsule Take 40 mg  by mouth daily.    [name] ezetimibe (ZETIA) 10 MG tablet Take 1 tablet (10 mg total) by mouth daily.  [name] lubiprostone (AMITIZA) 8 MCG capsule Take 8 mcg by mouth daily.   . tamsulosin (FLOMAX) 0.4 MG CAPS capsule TK ONE C PO HS  . [DISCONTINUED] methylPREDNISolone (MEDROL DOSEPAK) 4 MG TBPK tablet Take 4 mg by mouth daily. 5 DAYS     Allergies:   Anti-oxidant; Centrum; and Latex   Social History   Socioeconomic History  . Marital status: Married    Spouse name: None  . Number of children: 2  . Years of education: None  . Highest education level: None  Social Needs  . Financial resource strain: None  . Food insecurity - worry: None    . Food insecurity - inability: None  . Transportation needs - medical: None  . Transportation needs - non-medical: None  Occupational History  . Occupation: Research officer, political party  Tobacco Use  . Smoking status: Never Smoker  . Smokeless tobacco: Never Used  Substance and Sexual Activity  . Alcohol use: No  . Drug use: No  . Sexual activity: None  Other Topics Concern  . None  Social History Narrative  . None     Family History:  Family History  Problem Relation Age of Onset  . Heart failure Father   . Heart attack Father        x2  . Cancer Mother        of eye-causing her to lose her eye     ROS:   Please see the history of present illness. Following with urology for frequent urination. No bleeding. All other systems are reviewed and otherwise negative.    PHYSICAL EXAM:   VS:  BP 118/70   Pulse 87   Ht 5\' 11"  (1.803 m)   Wt 180 lb (81.6 kg)   SpO2 97%   BMI 25.10 kg/m   BMI: Body mass index is 25.1 kg/m. GEN: Well nourished, well developed WM, in no acute distress  HEENT: normocephalic, atraumatic Neck: no JVD, carotid bruits, or masses Cardiac: RRR; no murmurs, rubs, or gallops, no edema  Respiratory:  clear to auscultation bilaterally, normal work of breathing GI: soft, nontender, nondistended, + BS MS: no deformity or atrophy  Skin: warm and dry, no rash Neuro:  Alert and Oriented x 3, Strength and sensation are intact, follows commands Psych: euthymic mood, full affect  Wt Readings from Last 3 Encounters:  03/26/17 180 lb (81.6 kg)  09/25/16 182 lb 6.4 oz (82.7 kg)  07/23/16 182 lb (82.6 kg)      Studies/Labs Reviewed:   EKG:  EKG was ordered today and personally reviewed by me and demonstrates NSR 87bpm, RBBB, no sig change from prior.  Recent Labs: 09/19/2016: BUN 20; Creatinine, Ser 1.47; Hemoglobin 15.2; Platelets 246; Potassium 4.8; Sodium 140   Additional studies/ records that were reviewed today  include: Summarized above.    ASSESSMENT & PLAN:   1. Paroxysmal atrial fibrillation - maintaining NSR without symptoms. Continue Xarelto. I checked his CrCl against last creatinine from 01/2017 and dose is appropriate. Bleeding precautions discussed. Continue current regimen, but I will check with Dr. Clifton James whether he actually needs to be maintained on aspirin as well. Will update BMET, CBC for our records. He also recently discussed his PPI with GI who recommends to continue for now but asked that he get a periodic magnesium checked, so will order today. 2. Dilated thoracic  aorta - due to repeat echo in 05/2017, will order. Blood pressure is well controlled. This actually was not mentioned on echo in 05/2016 so will f/u to see if this remains an issue. 3. Aortic insufficiency - due to repeat echo in 05/2017, will order. No murmur on exam. 4. CKD stage III - recheck today as above.  Disposition: F/u with Dr. Clifton James in 6 months. I explained our office appointment model to the best of my ability during our visit, and role of APPs in care team. Empathy provided to his frustration. I told him the best way to see his actual physician is to call our office when he gets his recall notification asking him to call, instead of within weeks of when the appointment is needed - I.e. For 6 month f/u (per his request), I told him to call in February to schedule.    Medication Adjustments/Labs and Tests Ordered: Current medicines are reviewed at length with the patient today.  Concerns regarding medicines are outlined above. Medication changes, Labs and Tests ordered today are summarized above and listed in the Patient Instructions accessible in Encounters.   Signed, Laurann Montana, PA-C  03/26/2017 10:23 AM    [facility] [address] [place], [place], [place]  [address] Phone: [phone]; Fax: [phone]

## 2017-03-26 ENCOUNTER — Ambulatory Visit (INDEPENDENT_AMBULATORY_CARE_PROVIDER_SITE_OTHER): Payer: Medicare Other | Admitting: Physician Assistant

## 2017-03-26 ENCOUNTER — Telehealth: Payer: Self-pay | Admitting: Physician Assistant

## 2017-03-26 ENCOUNTER — Encounter: Payer: Self-pay | Admitting: Physician Assistant

## 2017-03-26 VITALS — BP 118/70 | HR 87 | Ht 71.0 in | Wt 180.0 lb

## 2017-03-26 DIAGNOSIS — I480 Paroxysmal atrial fibrillation: Secondary | ICD-10-CM | POA: Diagnosis not present

## 2017-03-26 DIAGNOSIS — I351 Nonrheumatic aortic (valve) insufficiency: Secondary | ICD-10-CM

## 2017-03-26 DIAGNOSIS — N183 Chronic kidney disease, stage 3 unspecified: Secondary | ICD-10-CM

## 2017-03-26 DIAGNOSIS — I77810 Thoracic aortic ectasia: Secondary | ICD-10-CM

## 2017-03-26 NOTE — Telephone Encounter (Signed)
Called pt to let him know that Dr. Clifton James is ok with him stopping his ASA. Pt thanked me for the call.

## 2017-03-26 NOTE — Patient Instructions (Signed)
Medication Instructions:  Your physician recommends that you continue on your current medications as directed. Please refer to the Current Medication list given to you today.   Labwork: TODAY:  BMET, CBC, & MAGNESIUM  Testing/Procedures: Your physician has requested that you have an echocardiogram. Echocardiography is a painless test that uses sound waves to create images of your heart. It provides your doctor with information about the size and shape of your heart and how well your heart's chambers and valves are working. This procedure takes approximately one hour. There are no restrictions for this procedure.   Follow-Up: Your physician wants you to follow-up in: 6 MONTHS WITH DR. Clifton James.  CALL IN February TO MAKE THIS APPT  You will receive a reminder letter in the mail two months in advance. If you don't receive a letter, please call our office to schedule the follow-up appointment.   Any Other Special Instructions Will Be Listed Below (If Applicable).   Echocardiogram An echocardiogram, or echocardiography, uses sound waves (ultrasound) to produce an image of your heart. The echocardiogram is simple, painless, obtained within a short period of time, and offers valuable information to your health care provider. The images from an echocardiogram can provide information such as:  Evidence of coronary artery disease (CAD).  Heart size.  Heart muscle function.  Heart valve function.  Aneurysm detection.  Evidence of a past heart attack.  Fluid buildup around the heart.  Heart muscle thickening.  Assess heart valve function.  Tell a health care provider about:  Any allergies you have.  All medicines you are taking, including vitamins, herbs, eye drops, creams, and over-the-counter medicines.  Any problems you or family members have had with anesthetic medicines.  Any blood disorders you have.  Any surgeries you have had.  Any medical conditions you  have.  Whether you are pregnant or may be pregnant. What happens before the procedure? No special preparation is needed. Eat and drink normally. What happens during the procedure?  In order to produce an image of your heart, gel will be applied to your chest and a wand-like tool (transducer) will be moved over your chest. The gel will help transmit the sound waves from the transducer. The sound waves will harmlessly bounce off your heart to allow the heart images to be captured in real-time motion. These images will then be recorded.  You may need an IV to receive a medicine that improves the quality of the pictures. What happens after the procedure? You may return to your normal schedule including diet, activities, and medicines, unless your health care provider tells you otherwise. This information is not intended to replace advice given to you by your health care provider. Make sure you discuss any questions you have with your health care provider. Document Released: 04/19/2000 Document Revised: 12/09/2015 Document Reviewed: 12/28/2012 Elsevier Interactive Patient Education  2017 [employer].  If you need a refill on your cardiac medications before your next appointment, please call your pharmacy.

## 2017-03-26 NOTE — Telephone Encounter (Signed)
Heard back from Dr. Clifton James - Please let patient know Dr. Clifton James agrees it's OK to stop his aspirin.  Dayna Dunn PA-C

## 2017-03-27 LAB — CBC
HEMATOCRIT: 44.7 % (ref 37.5–51.0)
Hemoglobin: 15.7 g/dL (ref 13.0–17.7)
MCH: 33.1 pg — AB (ref 26.6–33.0)
MCHC: 35.1 g/dL (ref 31.5–35.7)
MCV: 94 fL (ref 79–97)
PLATELETS: 208 10*3/uL (ref 150–379)
RBC: 4.74 x10E6/uL (ref 4.14–5.80)
RDW: 14 % (ref 12.3–15.4)
WBC: 6.3 10*3/uL (ref 3.4–10.8)

## 2017-03-27 LAB — BASIC METABOLIC PANEL
BUN / CREAT RATIO: 16 (ref 10–24)
BUN: 22 mg/dL (ref 8–27)
CHLORIDE: 103 mmol/L (ref 96–106)
CO2: 22 mmol/L (ref 20–29)
Calcium: 9.3 mg/dL (ref 8.6–10.2)
Creatinine, Ser: 1.39 mg/dL — ABNORMAL HIGH (ref 0.76–1.27)
GFR calc non Af Amer: 47 mL/min/{1.73_m2} — ABNORMAL LOW (ref >59)
GFR, EST AFRICAN AMERICAN: 54 mL/min/{1.73_m2} — AB (ref >59)
GLUCOSE: 114 mg/dL — AB (ref 65–99)
Potassium: 4.3 mmol/L (ref 3.5–5.2)
SODIUM: 141 mmol/L (ref 134–144)

## 2017-03-27 LAB — MAGNESIUM: MAGNESIUM: 2 mg/dL (ref 1.6–2.3)

## 2017-04-04 ENCOUNTER — Other Ambulatory Visit: Payer: Self-pay

## 2017-04-04 ENCOUNTER — Telehealth: Payer: Self-pay | Admitting: Cardiovascular Disease

## 2017-04-04 ENCOUNTER — Ambulatory Visit (HOSPITAL_COMMUNITY): Payer: Medicare Other | Attending: Cardiology

## 2017-04-04 DIAGNOSIS — I77810 Thoracic aortic ectasia: Secondary | ICD-10-CM

## 2017-04-04 DIAGNOSIS — E785 Hyperlipidemia, unspecified: Secondary | ICD-10-CM | POA: Diagnosis not present

## 2017-04-04 DIAGNOSIS — I351 Nonrheumatic aortic (valve) insufficiency: Secondary | ICD-10-CM | POA: Diagnosis not present

## 2017-04-04 DIAGNOSIS — I5189 Other ill-defined heart diseases: Secondary | ICD-10-CM | POA: Insufficient documentation

## 2017-04-04 NOTE — Telephone Encounter (Signed)
Spoke with patient and advised him that Justin Spies, PA got agreement from Dr. Clifton Daniels to stop aspirin. He asked about his dose of Xarelto. I verified with [name] that patient should continue Xarelto 15 mg daily.   Notes recorded by Justin Montana, PA-C on 03/28/2017 at 8:15 AM EST Please let patient know labs were reviewed and are all stable, but a few notable findings: - Creatinine is 1.39, similar to baseline of 1.4-1.5. [Personal note: I calculated his Creatinine Clearance which was 69ml/min, establishing that he is on the correct dose of Xarelto still.] - CBC and magnesium look great. - Blood sugar is a little high, consistent with pre-diabetes. F/u PCP for this. Justin Spies PA-C   Patient verbalized understanding and agreement and thanked me for the call.

## 2017-04-04 NOTE — Telephone Encounter (Signed)
Please call patient regarding his dosage of Xarelto.  Should he be taking 15 or 20 mg/day since he has discontinued his 81mg  Aspirin

## 2017-04-07 ENCOUNTER — Telehealth: Payer: Self-pay | Admitting: Physician Assistant

## 2017-04-07 NOTE — Telephone Encounter (Signed)
Follow Up:     Returning your call from this morning,concerning his test results.

## 2017-04-07 NOTE — Telephone Encounter (Signed)
-----   Message from Laurann Montana, [place] sent at 04/04/2017  5:08 PM EST ----- Please let patient know echo looks stable compared to prior. Strong heart muscle. Mild stiffness of heart (diastolic dysfunction). Would advise to generally stick to lower sodium diet (aiming for maximim 2,000mg  per day) and staying hydrated but not to excess >2L per day of total fluid intake. Dilated aorta remains mild - continue good BP control. Dayna Dunn PA-C

## 2017-04-07 NOTE — Telephone Encounter (Signed)
Returned pt's call and left another message

## 2017-04-08 NOTE — Telephone Encounter (Signed)
New message   Pt verbalzied that he want the rn to mail the results to him

## 2017-04-08 NOTE — Telephone Encounter (Signed)
Reviewed echo results with pt in great detail.  Reviewed diet and fluid intake restrictions and discussed foods higher in NA+.  Answered all pt's questions and routed his echo results to his PCP at his request.  He was very grateful for the call and detailed information.

## 2017-07-16 ENCOUNTER — Ambulatory Visit: Payer: Medicare Other | Admitting: Cardiovascular Disease

## 2017-07-16 ENCOUNTER — Encounter: Payer: Self-pay | Admitting: Cardiovascular Disease

## 2017-07-16 VITALS — BP 106/60 | HR 88 | Ht 71.0 in | Wt 183.6 lb

## 2017-07-16 DIAGNOSIS — I351 Nonrheumatic aortic (valve) insufficiency: Secondary | ICD-10-CM | POA: Diagnosis not present

## 2017-07-16 DIAGNOSIS — I712 Thoracic aortic aneurysm, without rupture, unspecified: Secondary | ICD-10-CM

## 2017-07-16 DIAGNOSIS — I480 Paroxysmal atrial fibrillation: Secondary | ICD-10-CM

## 2017-07-16 NOTE — Progress Notes (Signed)
Chief Complaint  Patient presents with  . Follow-up    atrial fibrillation   History of Present Illness: 84 yo male with paroxysmal atrial fibrillation, prior PE, HLD, vasovagal syncope, thoracic aortic aneurysm, mild aortic valve insufficiency, RBBB and CKD who is here today for cardiac follow up. He has a history of unprovoked pulmonary emboli diagnosed in July of 2008 and was on coumadin until this was stopped in August 2015 by Hematology. He is not known to have CAD. Nuclear stress test without ischemia December 2017. Echo January 2018 with normal LV systolic function, mild AI, trivial MR. He has passed out easily his entire life and passes out after any procedure. 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 He was seen by Dr. Everlena Cooper with Neurology and also evaluated by Neurosurgery. No further workup planned. Dizziness felt to be due to vertigo. He was diagnosed with atrial fibrillation 07/11/16 at [facility] following episode of neck pain and dizziness. He converted to sinus in their ED. He was started on Cardizem and Xarelto. He was found to have brief runs of atrial fib on cardiac monitor April 2018 so he was continued on Xarelto.   He is here today for follow up. The patient denies any chest pain, dyspnea, palpitations, lower extremity edema, orthopnea, PND, dizziness, near syncope or syncope. Doing well overall. He has had some problems with vision. He has been told that it is due to migraines.   Primary Care Physician: Leandro Reasoner, MD  Past Medical History:  Diagnosis Date  . BPH (benign prostatic hyperplasia)   . CKD (chronic kidney disease), stage III (HCC)   . DVT (deep venous thrombosis) (HCC)   . GERD (gastroesophageal reflux disease)   . Hernia   . Hyperlipidemia   . Lung nodule   . Mild aortic insufficiency   . Mild dilation of ascending aorta  (HCC)   . PAF (paroxysmal atrial fibrillation) (HCC)   . Pulmonary embolism (HCC)   . RBBB   . Syncope     Past Surgical History:  Procedure Laterality Date  . EYE SURGERY  1957  . INGUINAL HERNIA REPAIR     x 3  . Left leg trauma    . LIPOMA RESECTION      Current Outpatient Medications  Medication Sig Dispense Refill  . alfuzosin (UROXATRAL) 10 MG 24 hr tablet Take 10 mg by mouth daily with breakfast.    . Cholecalciferol (VITAMIN D3) 2000 units capsule Take 2,000 Units by mouth daily.    . colesevelam (WELCHOL) 625 MG tablet Take 625 mg by mouth 4 (four) times daily.    . Cyanocobalamin (VITAMIN B12) 1000 MCG TBCR Take 1 tablet by mouth daily.    [name] diltiazem (CARDIZEM CD) 120 MG 24 hr capsule Take 120 mg by mouth.    . Ergocalciferol (VITAMIN D2) 400 units TABS Take 1 tablet by mouth daily.    [name] esomeprazole (NEXIUM) 40 MG capsule Take 40 mg by mouth daily.      [name] ezetimibe (ZETIA) 10 MG tablet Take 1 tablet (10 mg total) by mouth daily. 30 tablet 11  . lubiprostone (AMITIZA) 8 MCG capsule Take 8 mcg by mouth daily.     . Rivaroxaban (XARELTO) 15 MG TABS tablet Take 1 tablet (15 mg total) by mouth daily. 90 tablet 3   No current  facility-administered medications for this visit.     Allergies  Allergen Reactions  . Anti-Oxidant     REACTION: intol gi upset  . Centrum     GI upset  . Latex Rash    Social History   Socioeconomic History  . Marital status: Married    Spouse name: Not on file  . Number of children: 2  . Years of education: Not on file  . Highest education level: Not on file  Social Needs  . Financial resource strain: Not on file  . Food insecurity - worry: Not on file  . Food insecurity - inability: Not on file  . Transportation needs - medical: Not on file  . Transportation needs - non-medical: Not on file  Occupational History  . Occupation: Research officer, political party  Tobacco Use  . Smoking status: Never Smoker  .  Smokeless tobacco: Never Used  Substance and Sexual Activity  . Alcohol use: No  . Drug use: No  . Sexual activity: Not on file  Other Topics Concern  . Not on file  Social History Narrative  . Not on file    Family History  Problem Relation Age of Onset  . Heart failure Father   . Heart attack Father        x2  . Cancer Mother        of eye-causing her to lose her eye    Review of Systems:  As stated in the HPI and otherwise negative.   BP 106/60   Pulse 88   Ht 5\' 11"  (1.803 m)   Wt 183 lb 9.6 oz (83.3 kg)   SpO2 96%   BMI 25.61 kg/m   Physical Examination:  General: Well developed, well nourished, NAD  HEENT: OP clear, mucus membranes moist  SKIN: warm, dry. No rashes. Neuro: No focal deficits  Musculoskeletal: Muscle strength 5/5 all ext  Psychiatric: Mood and affect normal  Neck: No JVD, no carotid bruits, no thyromegaly, no lymphadenopathy.  Lungs:Clear bilaterally, no wheezes, rhonci, crackles Cardiovascular: Regular rate and rhythm. No murmurs, gallops or rubs. Abdomen:Soft. Bowel sounds present. Non-tender.  Extremities: No lower extremity edema. Pulses are 2 + in the bilateral DP/PT.  Echo 04/04/17: - Left ventricle: The cavity size was normal. Wall thickness was   normal. Systolic function was normal. The estimated ejection   fraction was in the range of 55% to 60%. Wall motion was normal;   there were no regional wall motion abnormalities. Doppler   parameters are consistent with abnormal left ventricular   relaxation (grade 1 diastolic dysfunction). - Aortic valve: There was mild regurgitation. - Ascending aorta: The ascending aorta was mildly dilated.  Impressions:  - Normal LV systolic function; mild diastolic dysfunction; mild AI;   mildly dilated ascending aorta.  Nuclear stress test December 2017:  Nuclear stress EF: 55%.  Blood pressure demonstrated a normal response to exercise.  There was no ST segment deviation noted during  stress.  The study is normal.  This is a low risk study.  The left ventricular ejection fraction is normal (55-65%).  The patient was asymptomatic during the stress test.   Normal exercise nuclear stress test with no evidence of prior infarct or ischemia.  Good exercise capacity. Normal BP response to stress.   EKG:  EKG is not ordered today. The EKG demonstrates  Recent Labs: 03/26/2017: BUN 22; Creatinine, Ser 1.39; Hemoglobin 15.7; Magnesium 2.0; Platelets 208; Potassium 4.3; Sodium 141    Wt Readings  from Last 3 Encounters:  07/16/17 183 lb 9.6 oz (83.3 kg)  03/26/17 180 lb (81.6 kg)  09/25/16 182 lb 6.4 oz (82.7 kg)     Other studies Reviewed: Additional studies/ records that were reviewed today include: . Review of the above records demonstrates:   Assessment and Plan:   1.  Aortic regurgitation: Mild by echo in November 2018. Repeat echo in November 2020.  2. Atrial fibrillation, paroxysmal: He appears to be in sinus today. Will continue Cardizem and Xarelto.    3. Hyperlipidemia: Followed by primary care. He is on [employer] and Zetia.   4. Thoracic aortic aneurysm: Mild by echo in November 2018. I do not think a CT scan is indicated at this time. Will reassess with echo next year.   Current medicines are reviewed at length with the patient today.  The patient does not have concerns regarding medicines.  The following changes have been made:  no change  Labs/ tests ordered today include:   No orders of the defined types were placed in this encounter.    Disposition:   FU with me in 12 months   Signed, Verne Carrow, MD 07/16/2017 2:03 PM    [facility] [address] [place], [place], [place]  [address] Phone: [phone]; Fax: [phone]

## 2017-07-16 NOTE — Patient Instructions (Signed)

## 2017-07-18 ENCOUNTER — Other Ambulatory Visit: Payer: Self-pay | Admitting: Cardiology

## 2017-07-18 NOTE — Telephone Encounter (Signed)
Should this be refilled by Dr Clifton James? Doesn't look like patient is seeing Dr Delton See. Please advise. Thanks, MI

## 2017-07-18 NOTE — Telephone Encounter (Signed)
It should be filled by Dr. Clifton James

## 2017-07-19 ENCOUNTER — Other Ambulatory Visit: Payer: Self-pay | Admitting: Cardiology

## 2017-07-21 NOTE — Telephone Encounter (Signed)
Pt saw Dr Clifton James on 07/16/17. Wt was 88.3Kg. SCr from 03/26/17 was 1.39. Age 84 yrs old. CrCl is 28mL/min. Thus will refill Xarelto 15mg  QD.

## 2018-04-21 ENCOUNTER — Ambulatory Visit: Payer: Medicare Other | Admitting: Internal Medicine

## 2018-04-21 ENCOUNTER — Encounter: Payer: Self-pay | Admitting: Internal Medicine

## 2018-04-21 VITALS — BP 110/66 | HR 87 | Ht 72.0 in | Wt 182.2 lb

## 2018-04-21 DIAGNOSIS — I480 Paroxysmal atrial fibrillation: Secondary | ICD-10-CM

## 2018-04-21 DIAGNOSIS — Z7901 Long term (current) use of anticoagulants: Secondary | ICD-10-CM | POA: Diagnosis not present

## 2018-04-21 DIAGNOSIS — I2699 Other pulmonary embolism without acute cor pulmonale: Secondary | ICD-10-CM | POA: Diagnosis not present

## 2018-04-21 NOTE — Patient Instructions (Signed)
Glad you are doing well. Please call if we can help 
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
## 2018-05-07 NOTE — Assessment & Plan Note (Signed)
Xarelto managed by cardiology

## 2018-05-07 NOTE — Assessment & Plan Note (Signed)
No recurrence.  He is now on Xarelto because of his atrial fibrillation.  He understands to avoid prolonged stasis in leg veins.

## 2018-06-18 ENCOUNTER — Other Ambulatory Visit: Payer: Self-pay | Admitting: Cardiovascular Disease

## 2018-07-06 ENCOUNTER — Ambulatory Visit: Payer: Medicare Other | Admitting: Cardiovascular Disease

## 2018-07-14 ENCOUNTER — Other Ambulatory Visit: Payer: Self-pay | Admitting: Cardiovascular Disease

## 2018-07-14 NOTE — Telephone Encounter (Signed)
Pt last saw Dr Clifton James 07/16/17, has f/u yearly appt scheduled for 08/21/18.  Last labs 03/26/17 Creat 1.39, overdue for labwork placed note on appt to draw CMP and CBC at [facility].  Age 85, weight 82.6kg, CrCl 47.04, based on CrCl pt is on appropriate dosage of Xarelto 15mg  QD.  Will refill rx x 1 to get to [facility] on 08/21/18.

## 2018-07-18 IMAGING — NM NM MISC PROCEDURE
6 series · 36 of 36 positions shown · non-contrast
Comparison: none

[Series 1: wbr_s-proj_st stress-sum-em · 6.40mm/px · 6 of 64 frames shown]
[frame 6/64]
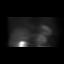
[frame 16/64]
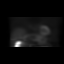
[frame 27/64]
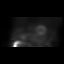
[frame 38/64]
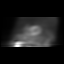
[frame 48/64]
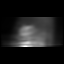
[frame 59/64]
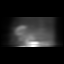

[Series 1: stress-sum-em · 6.40mm/px · 6 of 64 frames shown]
[frame 6/64]
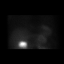
[frame 16/64]
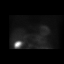
[frame 27/64]
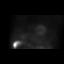
[frame 38/64]
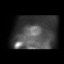
[frame 48/64]
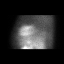
[frame 59/64]
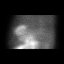

[Series 1: wbr_s-proj_st stress-gsp · 6.40mm/px · 6 of 512 frames shown]
[frame 43/512]
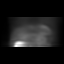
[frame 128/512]
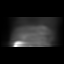
[frame 214/512]
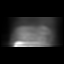
[frame 299/512]
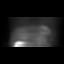
[frame 384/512]
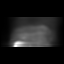
[frame 470/512]
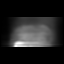

[Series 1: stress-gsp · 6.40mm/px · 6 of 512 frames shown]
[frame 43/512]
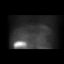
[frame 128/512]
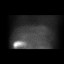
[frame 214/512]
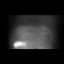
[frame 299/512]
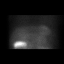
[frame 384/512]
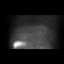
[frame 470/512]
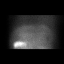

[Series 1: rest · 6.40mm/px · 6 of 64 frames shown]
[frame 6/64]
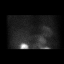
[frame 16/64]
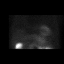
[frame 27/64]
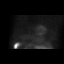
[frame 38/64]
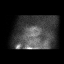
[frame 48/64]
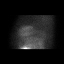
[frame 59/64]
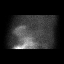

[Series 1: wbr_r-proj_st rest · 6.40mm/px · 6 of 64 frames shown]
[frame 6/64]
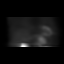
[frame 16/64]
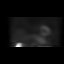
[frame 27/64]
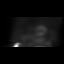
[frame 38/64]
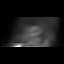
[frame 48/64]
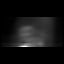
[frame 59/64]
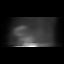

[36 of 36 positions shown; findings below may reference images not displayed]

Canned report from images found in remote index.

Refer to host system for actual result text.

## 2018-08-20 ENCOUNTER — Telehealth: Payer: Self-pay | Admitting: Cardiovascular Disease

## 2018-08-20 NOTE — Telephone Encounter (Signed)
VIDEO Doximity Visit on 08/21/18   Patient wasn't sure if he can access MyChart. Patient wanted a copy of the consent emailed. Consent has been emailed. Patient was not home to open consent. I read consent to patient incase patient was not able to view consent in email.   Phone call to obtain consent  -  08/20/2018        Virtual Visit Pre-Appointment Phone Call  Steps For Call:  1. Confirm consent - "In the setting of the current Covid19 crisis, you are scheduled for a (phone or video) visit with your provider on (date) at (time).  Just as we do with many in-office visits, in order for you to participate in this visit, we must obtain consent.  If you'd like, I can send this to your mychart (if signed up) or email for you to review.  Otherwise, I can obtain your verbal consent now.  All virtual visits are billed to your insurance company just like a normal visit would be.  By agreeing to a virtual visit, we'd like you to understand that the technology does not allow for your provider to perform an examination, and thus may limit your provider's ability to fully assess your condition.  Finally, though the technology is pretty good, we cannot assure that it will always work on either your or our end, and in the setting of a video visit, we may have to convert it to a phone-only visit.  In either situation, we cannot ensure that we have a secure connection.  Are you willing to proceed?" STAFF: Did the patient verbally acknowledge consent to telehealth visit? Document YES/NO here: YES  2. Confirm the BEST phone number to call the day of the visit by including in appointment notes  3. Give patient instructions for WebEx/MyChart download to smartphone as below or Doximity/Doxy.me if video visit (depending on what platform provider is using)  4. Advise patient to be prepared with their blood pressure, heart rate, weight, any heart rhythm information, their current medicines, and a piece of paper and  pen handy for any instructions they may receive the day of their visit  5. Inform patient they will receive a phone call 15 minutes prior to their appointment time (may be from unknown caller ID) so they should be prepared to answer  6. Confirm that appointment type is correct in Epic appointment notes (VIDEO vs PHONE)     TELEPHONE CALL NOTE  [name] has been deemed a candidate for a follow-up tele-health visit to limit community exposure during the Covid-19 pandemic. I spoke with the patient via phone to ensure availability of phone/video source, confirm preferred email & phone number, and discuss instructions and expectations.  I reminded [name] to be prepared with any vital sign and/or heart rhythm information that could potentially be obtained via home monitoring, at the time of his visit. I reminded [name] to expect a phone call at the time of his visit if his visit.  Birdie Riddle 08/20/2018 11:08 AM   INSTRUCTIONS FOR DOWNLOADING THE WEBEX APP TO SMARTPHONE  - If Apple, ask patient to go to App Store and type in WebEx in the search bar. Download [employer], the blue/green circle. If Android, go to [employer] and type in [employer] in the search bar. The app is free but as with any other app downloads, their phone may require them to verify saved payment information or Apple/Android password.  - The patient  does NOT have to create an account. - On the day of the visit, the assist will walk the patient through joining the meeting with the meeting number/password.  INSTRUCTIONS FOR DOWNLOADING THE MYCHART APP TO SMARTPHONE  - The patient must first make sure to have activated MyChart and know their login information - If Apple, go to [employer] Store and type in MyChart in the search bar and download the app. If Android, ask patient to go to [employer] Play Store and type in [place] in the search bar and download the app. The app is free but as with  any other app downloads, their phone may require them to verify saved payment information or Apple/Android password.  - The patient will need to then log into the app with their MyChart username and password, and select [facility] as their healthcare provider to link the account. When it is time for your visit, go to the MyChart app, find appointments, and click Begin Video Visit. Be sure to Select Allow for your device to access the Microphone and Camera for your visit. You will then be connected, and your provider will be with you shortly.  **If they have any issues connecting, or need assistance please contact MyChart service desk [phone])**  **If using a computer, in order to ensure the best quality for their visit they will need to use either of the following Internet Browsers: [employer] Edge, or Google Chrome**  IF USING DOXIMITY or DOXY.ME - The patient will receive a link just prior to their visit, either by text or email (to be determined day of appointment depending on if it's doxy.me or Doximity).     FULL LENGTH CONSENT FOR TELE-HEALTH VISIT   I hereby voluntarily request, consent and authorize [facility] and its employed or contracted physicians, physician assistants, nurse practitioners or other licensed health care professionals (the Practitioner), to provide me with telemedicine health care services (the Services") as deemed necessary by the treating Practitioner. I acknowledge and consent to receive the Services by the Practitioner via telemedicine. I understand that the telemedicine visit will involve communicating with the Practitioner through live audiovisual communication technology and the disclosure of certain medical information by electronic transmission. I acknowledge that I have been given the opportunity to request an in-person assessment or other available alternative prior to the telemedicine visit and am voluntarily participating in the  telemedicine visit.  I understand that I have the right to withhold or withdraw my consent to the use of telemedicine in the course of my care at any time, without affecting my right to future care or treatment, and that the Practitioner or I may terminate the telemedicine visit at any time. I understand that I have the right to inspect all information obtained and/or recorded in the course of the telemedicine visit and may receive copies of available information for a reasonable fee.  I understand that some of the potential risks of receiving the Services via telemedicine include:   Delay or interruption in medical evaluation due to technological equipment failure or disruption;  Information transmitted may not be sufficient (e.g. poor resolution of images) to allow for appropriate medical decision making by the Practitioner; and/or   In rare instances, security protocols could fail, causing a breach of personal health information.  Furthermore, I acknowledge that it is my responsibility to provide information about my medical history, conditions and care that is complete and accurate to the best of my ability. I acknowledge that Practitioner's advice, recommendations,  and/or decision may be based on factors not within their control, such as incomplete or inaccurate data provided by me or distortions of diagnostic images or specimens that may result from electronic transmissions. I understand that the practice of medicine is not an exact science and that Practitioner makes no warranties or guarantees regarding treatment outcomes. I acknowledge that I will receive a copy of this consent concurrently upon execution via email to the email address I last provided but may also request a printed copy by calling the office of [facility].    I understand that my insurance will be billed for this visit.   I have read or had this consent read to me.  I understand the contents of this consent, which  adequately explains the benefits and risks of the Services being provided via telemedicine.   I have been provided ample opportunity to ask questions regarding this consent and the Services and have had my questions answered to my satisfaction.  I give my informed consent for the services to be provided through the use of telemedicine in my medical care  By participating in this telemedicine visit I agree to the above.

## 2018-08-21 ENCOUNTER — Telehealth (INDEPENDENT_AMBULATORY_CARE_PROVIDER_SITE_OTHER): Payer: Medicare Other | Admitting: Cardiovascular Disease

## 2018-08-21 ENCOUNTER — Encounter: Payer: Self-pay | Admitting: Cardiovascular Disease

## 2018-08-21 ENCOUNTER — Other Ambulatory Visit: Payer: Self-pay

## 2018-08-21 VITALS — BP 124/78 | HR 86 | Ht 71.0 in | Wt 174.0 lb

## 2018-08-21 DIAGNOSIS — E7800 Pure hypercholesterolemia, unspecified: Secondary | ICD-10-CM

## 2018-08-21 DIAGNOSIS — I351 Nonrheumatic aortic (valve) insufficiency: Secondary | ICD-10-CM

## 2018-08-21 DIAGNOSIS — I712 Thoracic aortic aneurysm, without rupture, unspecified: Secondary | ICD-10-CM

## 2018-08-21 DIAGNOSIS — I480 Paroxysmal atrial fibrillation: Secondary | ICD-10-CM
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
## 2018-08-21 NOTE — Telephone Encounter (Signed)
Noted. Dr. Clifton James spoke with him.

## 2018-08-21 NOTE — Telephone Encounter (Signed)
F/U Message             Patient just called and he wants a telephone call for his visit because his phone does not get video.

## 2018-08-21 NOTE — Patient Instructions (Signed)
Medication Instructions:  No changes If you need a refill on your cardiac medications before your next appointment, please call your pharmacy.   Lab work: None today.  If you have labs (blood work) drawn today and your tests are completely normal, you will receive your results only by: [name] MyChart Message (if you have MyChart) OR . A paper copy in the mail If you have any lab test that is abnormal or we need to change your treatment, we will call you to review the results.  Testing/Procedures:WE WILL WAIT TO SCHEDULE ECHO FOR NOW.  PLAN IS FOR November, WHEN DR. Clifton James WILL SEE YOU THE SAME DAY.  Your physician has requested that you have an echocardiogram. Echocardiography is a painless test that uses sound waves to create images of your heart. It provides your doctor with information about the size and shape of your heart and how well your heart's chambers and valves are working. This procedure takes approximately one hour. There are no restrictions for this procedure.   Follow-Up: At [facility], you and your health needs are our priority.  As part of our continuing mission to provide you with exceptional heart care, we have created designated Provider Care Teams.  These Care Teams include your primary Cardiologist (physician) and Advanced Practice Providers (APPs -  Physician Assistants and Nurse Practitioners) who all work together to provide you with the care you need, when you need it. You will need a follow up appointment in (November) 7 months.  Please call our office 2 months in advance to schedule this appointment.  You may see Verne Carrow, MD or one of the following Advanced Practice Providers on your designated Care Team:   Carl, PA-C Ronie Spies, PA-C . Jacolyn Reedy, PA-C  Any Other Special Instructions Will Be Listed Below (If Applicable).

## 2018-10-07 ENCOUNTER — Other Ambulatory Visit: Payer: Self-pay | Admitting: Pharmacist

## 2018-10-07 DIAGNOSIS — I480 Paroxysmal atrial fibrillation: Secondary | ICD-10-CM

## 2018-10-07 NOTE — Progress Notes (Unsigned)
Age 85, weight 78.9kg, has not had SCr checked since 2018

## 2018-10-07 NOTE — Telephone Encounter (Signed)
Received refill request for Xarelto 15mg  daily from [facility], however pt has not had lab work done since 2018. He will need new CBC and BMET so that we can confirm he remains on the right dose of Xarelto. PCP listed is an inpatient pulmonology group, no labs more recent in [facility] or [facility]. I have ordered labs, pt will need to be advised to go to local [facility] to have labs drawn at earliest convenience.

## 2018-10-07 NOTE — Telephone Encounter (Signed)
Patient called back returning Megan's Call.

## 2018-10-07 NOTE — Telephone Encounter (Signed)
Pt states Dr Tedra Senegal checks his labs every 4 months. Called to PCP office - [facility] in [place] and left message on RN line. Pt has also had trouble getting through to office to schedule follow up labs. Will await return call.

## 2018-10-08 NOTE — Telephone Encounter (Signed)
Called clinic again and was advised that Dr Tedra Senegal does not work at [facility] any longer. Provided with new contact [facility] [phone]. Spoke with operator who transferred me to the lab [phone], spoke with client services and stated they did not have any labs in the system for pt. Have spent 1 hour trying to track down labs. Pt stated to call #[phone], called them and left a message.

## 2018-10-09 MED ORDER — RIVAROXABAN 15 MG PO TABS: 15.0000 mg | ORAL_TABLET | Freq: Every day | ORAL | 1 refills | Status: DC

## 2018-10-09 NOTE — Telephone Encounter (Signed)
Called PCP office again as we never heard back yesterday, automated messages plays and then automatically hangs up despite office hours being open after 8:30am.

## 2018-10-09 NOTE — Telephone Encounter (Signed)
Finally got through to PCP office - 05/14/18 - SCr 1.45. CrCl 65mL/min, refill is appropriate for Xarelto 15mg  daily. Rx sent in.

## 2018-11-26 ENCOUNTER — Telehealth: Payer: Self-pay | Admitting: Cardiovascular Disease

## 2018-11-26 NOTE — Telephone Encounter (Signed)
New Message  Patient thinks he missed his dosage of his Rivaroxaban (XARELTO) 15 MG TABS tablet and diltiazem (CARDIZEM CD) 120 MG 24 hr capsule. Patient wants to know if he forgets whether he took the medication or not should he go ahead and take the medications possibly again or just wait until his next scheduled time to take the medication. Please give patient a call back.

## 2018-11-26 NOTE — Telephone Encounter (Signed)
I spoke with pt. He reports sometimes he forgets whether he takes Xarelto and Cardizem.I told him if he is unsure whether or not he took this medication he should wait until the next scheduled dose to take.

## 2019-03-14 NOTE — Progress Notes (Signed)
Chief Complaint  Patient presents with  . Follow-up    PAF   History of Present Illness: 85 yo male with history of mild aortic valve insufficiency, paroxysmalatrial fibrillation, prior PE, hyperlipidmia, vasovagal syncope,thoracic aortic aneurysm,RBBB and chronic kidney disease who is here today for cardiac follow up. He has a history of unprovoked pulmonary emboli diagnosed in July of 2008 and was on coumadin until this was stopped in August 2015 by Hematology. He is not known to have CAD. Nuclear stress test without ischemia December 2017. Echo January 2018 with normal LV systolic function, mild AI, trivial MR. He has passed out easily his entire life and passes out after any procedure. 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 He was seen by Dr. Everlena Cooper with Neurology and also evaluated by Neurosurgery. No further workup planned. Dizziness felt to be due to vertigo. He was diagnosed with atrial fibrillation 07/11/16 at [facility] following episode of neck pain and dizziness. He converted to sinus in their ED. He was started on Cardizem and Xarelto. He was found to have brief runs of atrial fib on cardiac monitor April 2018 so he was continued on Xarelto.   He is here today for follow up. The patient denies any chest pain, dyspnea, palpitations, lower extremity edema, orthopnea, PND, dizziness, near syncope or syncope. He feels great.   Primary Care Physician: Leandro Reasoner, MD  Past Medical History:  Diagnosis Date  . BPH (benign prostatic hyperplasia)   . CKD (chronic kidney disease), stage III   . DVT (deep venous thrombosis) (HCC)   . GERD (gastroesophageal reflux disease)   . Hernia   . Hyperlipidemia   . Lung nodule   . Mild aortic insufficiency   . Mild dilation of ascending aorta (HCC)   . PAF (paroxysmal atrial fibrillation) (HCC)   . Pulmonary  embolism (HCC)   . RBBB   . Syncope     Past Surgical History:  Procedure Laterality Date  . EYE SURGERY  1957  . INGUINAL HERNIA REPAIR     x 3  . Left leg trauma    . LIPOMA RESECTION      Current Outpatient Medications  Medication Sig Dispense Refill  . alfuzosin (UROXATRAL) 10 MG 24 hr tablet Take 10 mg by mouth daily with breakfast.    . Cholecalciferol (VITAMIN D3) 2000 units capsule Take 2,000 Units by mouth daily.    . colesevelam (WELCHOL) 625 MG tablet Take 625 mg by mouth 2 (two) times daily with a meal.     . Cyanocobalamin (VITAMIN B12) 1000 MCG TBCR Take 1 tablet by mouth daily.    [name] diltiazem (CARDIZEM CD) 120 MG 24 hr capsule TAKE 1 CAPSULE(120 MG) BY MOUTH DAILY 90 capsule 3  . esomeprazole (NEXIUM) 40 MG capsule Take 40 mg by mouth daily.      [name] ezetimibe (ZETIA) 10 MG tablet Take 1 tablet (10 mg total) by mouth daily. 30 tablet 11  . lubiprostone (AMITIZA) 8 MCG capsule Take 8 mcg by mouth daily.     . Rivaroxaban (XARELTO) 15 MG TABS tablet Take 1 tablet (15 mg total) by mouth daily with supper. 90 tablet 1   No current facility-administered medications for this visit.     Allergies  Allergen Reactions  . Tape Rash  . Anti-Oxidant     REACTION: intol  gi upset  . Centrum     GI upset  . Latex Rash    Social History   Socioeconomic History  . Marital status: Married    Spouse name: Not on file  . Number of children: 2  . Years of education: Not on file  . Highest education level: Not on file  Occupational History  . Occupation: Armed forces operational officer for [employer]  Social Needs  . Financial resource strain: Not on file  . Food insecurity    Worry: Not on file    Inability: Not on file  . Transportation needs    Medical: Not on file    Non-medical: Not on file  Tobacco Use  . Smoking status: Never Smoker  . Smokeless tobacco: Never Used  Substance and Sexual Activity  . Alcohol use: No  . Drug use: No  . Sexual activity: Not on  file  Lifestyle  . Physical activity    Days per week: Not on file    Minutes per session: Not on file  . Stress: Not on file  Relationships  . Social Herbalist on phone: Not on file    Gets together: Not on file    Attends religious service: Not on file    Active member of club or organization: Not on file    Attends meetings of clubs or organizations: Not on file    Relationship status: Not on file  . Intimate partner violence    Fear of current or ex partner: Not on file    Emotionally abused: Not on file    Physically abused: Not on file    Forced sexual activity: Not on file  Other Topics Concern  . Not on file  Social History Narrative  . Not on file    Family History  Problem Relation Age of Onset  . Heart failure Father   . Heart attack Father        x2  . Cancer Mother        of eye-causing her to lose her eye    Review of Systems:  As stated in the HPI and otherwise negative.   BP 112/70   Pulse 74   Ht 5\' 11"  (1.803 m)   Wt 177 lb (80.3 kg)   SpO2 97%   BMI 24.69 kg/m   Physical Examination:  General: Well developed, well nourished, NAD  HEENT: OP clear, mucus membranes moist  SKIN: warm, dry. No rashes. Neuro: No focal deficits  Musculoskeletal: Muscle strength 5/5 all ext  Psychiatric: Mood and affect normal  Neck: No JVD, no carotid bruits, no thyromegaly, no lymphadenopathy.  Lungs:Clear bilaterally, no wheezes, rhonci, crackles Cardiovascular: Regular rate and rhythm. No murmurs, gallops or rubs. Abdomen:Soft. Bowel sounds present. Non-tender.  Extremities: No lower extremity edema. Pulses are 2 + in the bilateral DP/PT.  Echo 04/04/17: - Left ventricle: The cavity size was normal. Wall thickness was   normal. Systolic function was normal. The estimated ejection   fraction was in the range of 55% to 60%. Wall motion was normal;   there were no regional wall motion abnormalities. Doppler   parameters are consistent with abnormal  left ventricular   relaxation (grade 1 diastolic dysfunction). - Aortic valve: There was mild regurgitation. - Ascending aorta: The ascending aorta was mildly dilated.  Impressions:  - Normal LV systolic function; mild diastolic dysfunction; mild AI;   mildly dilated ascending aorta.  Nuclear stress test December 2017:  Nuclear  stress EF: 55%.  Blood pressure demonstrated a normal response to exercise.  There was no ST segment deviation noted during stress.  The study is normal.  This is a low risk study.  The left ventricular ejection fraction is normal (55-65%).  The patient was asymptomatic during the stress test.   Normal exercise nuclear stress test with no evidence of prior infarct or ischemia.  Good exercise capacity. Normal BP response to stress.   EKG:  EKG is ordered today. The EKG demonstrates Sinus, rate 74 bpm. PVCs. RBBB  Recent Labs: No results found for requested labs within last 8760 hours.    Wt Readings from Last 3 Encounters:  03/15/19 177 lb (80.3 kg)  08/21/18 174 lb (78.9 kg)  04/21/18 182 lb 3.2 oz (82.6 kg)     Other studies Reviewed: Additional studies/ records that were reviewed today include: . Review of the above records demonstrates:   Assessment and Plan:   1.  Aortic regurgitation: Mild by echo today. Repeat echo in November 2022.    2. Atrial fibrillation, paroxysmal: Sinus today. Continue Xarelto and Cardizem.     3. Hyperlipidemia: Followed by primary care. Continue Welchol and Zetia.   4. Thoracic aortic aneurysm: Mild by echo in November 2018. Does not appear enlarged by echo today.   Current medicines are reviewed at length with the patient today.  The patient does not have concerns regarding medicines.  The following changes have been made:  no change  Labs/ tests ordered today include:   Orders Placed This Encounter  Procedures  . EKG 12-Lead     Disposition:   FU with me in 12 months   Signed,  Verne Carrow, MD 03/15/2019 12:08 PM    [facility] [address] [place], [place], [place]  [address] Phone: [phone]; Fax: [phone]

## 2019-03-15 ENCOUNTER — Encounter: Payer: Self-pay | Admitting: Cardiovascular Disease

## 2019-03-15 ENCOUNTER — Ambulatory Visit: Payer: Medicare Other | Admitting: Cardiovascular Disease

## 2019-03-15 ENCOUNTER — Other Ambulatory Visit: Payer: Self-pay

## 2019-03-15 ENCOUNTER — Ambulatory Visit (HOSPITAL_COMMUNITY): Payer: Medicare Other | Attending: Internal Medicine

## 2019-03-15 VITALS — BP 112/70 | HR 74 | Ht 71.0 in | Wt 177.0 lb

## 2019-03-15 DIAGNOSIS — I712 Thoracic aortic aneurysm, without rupture, unspecified: Secondary | ICD-10-CM

## 2019-03-15 DIAGNOSIS — I480 Paroxysmal atrial fibrillation: Secondary | ICD-10-CM

## 2019-03-15 DIAGNOSIS — E7800 Pure hypercholesterolemia, unspecified: Secondary | ICD-10-CM

## 2019-03-15 DIAGNOSIS — I351 Nonrheumatic aortic (valve) insufficiency: Secondary | ICD-10-CM

## 2019-03-15 NOTE — Patient Instructions (Signed)

## 2019-03-31 ENCOUNTER — Telehealth: Payer: Self-pay | Admitting: Cardiovascular Disease

## 2019-03-31 NOTE — Telephone Encounter (Signed)
Spoke with patient and reviewed Dr Camillia Herter recommendation.  Pt states understanding and reports he knows he worked too hard outside and had not eaten enough today.  He plans on making up for it tomorrow (Thanksgiving Day).

## 2019-03-31 NOTE — Telephone Encounter (Signed)
Left message for patient to call back  

## 2019-03-31 NOTE — Telephone Encounter (Signed)
Called patient back. Patient is concerned about an episode he had yesterday after working out in the yard. Patient stated he could tell his heart was skipping beats and he felt tired. Patient denies any chest pain or SOB. Patient thinks he was in A. FIB at the time. Patient stated he did not have a lot to eat yesterday and he could have been dehydrated as well. Patient stated he was wondering if he needs to be seen right away when this happens, or what he should do. Patient stated he feels fine today. Patient does not have a way to check his BP and HR right now, but he is going to get an Apple watch or [employer]ive[employer] Kardia device. Encouraged patient to keep taking his xarelto and cardizem that this will help. Will forward to Dr. Angelena Form for advisement.

## 2019-03-31 NOTE — Telephone Encounter (Signed)
Agree with getting the KardiaMobile device. He may feel poorly if he has atrial fib and may have it from time to time. No changes.

## 2019-03-31 NOTE — Telephone Encounter (Signed)
Patient is returning call.  °

## 2019-03-31 NOTE — Telephone Encounter (Signed)
Patient c/o Palpitations:  High priority if patient c/o lightheadedness, shortness of breath, or chest pain  1) How long have you had palpitations/irregular HR/ Afib? Are you having the symptoms now? Had episode yesterday for about 4 hours  2) Are you currently experiencing lightheadedness, SOB or CP? no  3) Do you have a history of afib (atrial fibrillation) or irregular heart rhythm? yes  4) Have you checked your BP or HR? (document readings if available)no  5) Are you experiencing any other symptoms? No symptoms today- worked in the yard yesterday and after that he felt exhausted, jittery, felt his pulse it was missing  A beat, had 2 beats instead of one

## 2019-04-07 ENCOUNTER — Telehealth: Payer: Self-pay | Admitting: Cardiovascular Disease

## 2019-04-07 NOTE — Telephone Encounter (Signed)
Left message for patient and adv to call back if he has further questions.  Adv they are at the same time, sometimes, when they are both ordered.  He had echo in November. Stress test was not ordered.

## 2019-04-07 NOTE — Telephone Encounter (Signed)
Patient wondering why stress tests and echo's are not done together anymore. States you can leave a detailed message if he does not answer.

## 2019-04-15 ENCOUNTER — Telehealth: Payer: Self-pay | Admitting: Cardiovascular Disease

## 2019-04-15 NOTE — Telephone Encounter (Signed)
Follow Up:     Pt is returning Michalene's call from 04-07-19.

## 2019-04-16 NOTE — Telephone Encounter (Signed)
I spoke with the patient's wife. He was wondering why he did not have a stress test this year and only had echo.  I adv that Dr. Angelena Form only ordered an echocardiogram because he wanted to look at aortic valve, and this would not require a stress test.  She wrote the information down and will share with the patient.

## 2019-04-22 ENCOUNTER — Ambulatory Visit (INDEPENDENT_AMBULATORY_CARE_PROVIDER_SITE_OTHER): Payer: Medicare Other | Admitting: Internal Medicine

## 2019-04-22 ENCOUNTER — Other Ambulatory Visit: Payer: Self-pay

## 2019-04-22 ENCOUNTER — Encounter: Payer: Self-pay | Admitting: Internal Medicine

## 2019-04-22 DIAGNOSIS — I2699 Other pulmonary embolism without acute cor pulmonale: Secondary | ICD-10-CM | POA: Diagnosis not present

## 2019-04-22 DIAGNOSIS — J209 Acute bronchitis, unspecified: Secondary | ICD-10-CM

## 2019-04-22 NOTE — Assessment & Plan Note (Signed)
Appropriate to remain on Xarelto as long as there are no bleeding complications. Managed by PCP

## 2019-04-22 NOTE — Progress Notes (Signed)
Patient ID: [name], male    DOB: 1932-08-17, 85 y.o.   MRN: [id number]  HPI 59 yoM never smoker with past hx of DVT/ PE( 2013, Lupus anticoagulant) and bronchitis, complicated by hx of lung nodule, GERD, vagalinstability, PAFib,  FENO- 03/13/16- 25- borderline for allergic asthma , possibly normal. ----------------------------------------------------------------------------------------------------  04/21/2018- 85 year old male never smoker with past history DVT/PE, bronchitis, complicated by history lung nodule, GERD, vagal instability, CKD 3, PA. fib, -----Follows for: asthmatic bronchitis and PE,  doing well Was diagnosed with atrial fibrillation and is now on Xarelto and diltiazem.  Otherwise okay with no acute respiratory events.  He paces himself but remains active and describes driving himself across country and back.  No routine cough.  04/22/2019- Virtual Visit via Telephone Note  I connected with [name] on 04/22/19 at 10:30 AM EST by telephone and verified that I am speaking with the correct person using two identifiers.  Location: Patient: H Provider: O   I discussed the limitations, risks, security and privacy concerns of performing an evaluation and management service by telephone and the availability of in person appointments. I also discussed with the patient that there may be a patient responsible charge related to this service. The patient expressed understanding and agreed to proceed.  History of Present Illness: 85 year old male never smoker with past history DVT/PE( 2013, Lupus aticoagulant), bronchitis, complicated by history lung nodule, GERD, vagal instability, CKD 3, PA. Fib, -----f/u pulmonary embolism/ Bronchitis Remains on Xarelto with no bleeding. Feels breathing is stable over past year with no acute issues. PCP treated a URI/ cold with zinc and vitC. Numbness in feet being eval for peripheral neuropathy He wants to maintain contact  with [place].  Observations/Objective: Echo 03/15/2019-  1. Left ventricular ejection fraction, by visual estimation, is 60 to 65%. The left ventricle has normal function. There is mildly increased left ventricular hypertrophy.  2. Left ventricular diastolic parameters are consistent with Grade I diastolic dysfunction (impaired relaxation).  3. Global right ventricle has normal systolic function.The right ventricular size is normal. No increase in right ventricular wall thickness.  4. Left atrial size was normal.  5. Right atrial size was normal.  6. The mitral valve is normal in structure. Trace mitral valve regurgitation.  7. The tricuspid valve is normal in structure. Tricuspid valve regurgitation is not demonstrated.  8. The aortic valve is tricuspid. Aortic valve regurgitation is trivial. Mild aortic valve sclerosis without stenosis.  9. The pulmonic valve was not well visualized. Pulmonic valve regurgitation is not visualized. 10. Mildly elevated pulmonary artery systolic pressure.  Assessment and Plan: DVT/ PE w/ Lupus anticoagulant- to continue chronic Xarelto  Follow Up Instructions: 1 year   I discussed the assessment and treatment plan with the patient. The patient was provided an opportunity to ask questions and all were answered. The patient agreed with the plan and demonstrated an understanding of the instructions.   The patient was advised to call back or seek an in-person evaluation if the symptoms worsen or if the condition fails to improve as anticipated.  I provided 18 minutes of non-face-to-face time during this encounter.   Jetty Duhamel, MD   ROS-see HPI  + = positive Constitutional:   No-   weight loss, night sweats, fevers, chills, fatigue, lassitude. No bleeding HEENT:   No-  headaches, difficulty swallowing, tooth/dental problems, sore throat,       No-  sneezing, itching, ear ache, + nasal congestion, post nasal drip,  CV:  No-   chest pain, orthopnea, PND,  swelling in lower extremities, anasarca, dizziness, palpitations Resp: No-   shortness of breath with exertion or at rest.              No-   productive cough,  No non-productive cough,  No- coughing up of blood.              No-   change in color of mucus.  No- wheezing.   Skin: No-   rash or lesions. GI:  No-   heartburn, indigestion, abdominal pain, nausea, vomiting,  GU: MS:  No-   joint pain or swelling.  Neuro-     ? Vagal instability- eval by his other physicians. Psych:  No- change in mood or affect. No depression or anxiety.  No memory loss.  OBJ- Physical Exam General- Alert, Oriented, Affect-appropriate, Distress- none acute. Looks well Skin- rash-none, lesions- none, excoriation- none Lymphadenopathy- none Head- atraumatic            Eyes- Gross vision intact, PERRLA, conjunctivae and secretions clear            Ears- Hearing, canals-normal            Nose- Clear, +Septal dev and dev external nose,  No-mucus, polyps, erosion, perforation             Throat- Mallampati II , mucosa clear , drainage- none, tonsils- atrophic Neck- flexible , trachea midline, no stridor , thyroid nl, carotid no bruit Chest - symmetrical excursion , unlabored           Heart/CV- RRR , no murmur , no gallop  , no rub, nl s1 s2                           - JVD- none , edema- none, stasis changes- none, varices- none           Lung- clear, mild,  wheeze-none, cough- none , dullness-none, rub- none           Chest wall-  Abd-  Br/ Gen/ Rectal- Not done, not indicated Extrem- cyanosis- none, clubbing, none, atrophy- none, strength- nl Neuro- grossly intact to observation

## 2019-04-22 NOTE — Assessment & Plan Note (Signed)
Tends to get bronchitis with viral syndrome URIs. Resolved similar episode with conservative mgmnt earlier this year.

## 2019-04-22 NOTE — Patient Instructions (Signed)
I'm glad your breathing seems to be doing well.  Please call if we can help

## 2019-07-08 ENCOUNTER — Other Ambulatory Visit: Payer: Self-pay

## 2019-07-08 MED ORDER — DILTIAZEM HCL ER COATED BEADS 120 MG PO CP24: 120.0000 mg | ORAL_CAPSULE | Freq: Every day | ORAL | 1 refills | Status: DC

## 2019-07-12 ENCOUNTER — Other Ambulatory Visit: Payer: Self-pay | Admitting: *Deleted

## 2019-07-12 MED ORDER — RIVAROXABAN 15 MG PO TABS: 15.0000 mg | ORAL_TABLET | Freq: Every day | ORAL | 2 refills | Status: DC

## 2019-07-12 NOTE — Telephone Encounter (Signed)
Xarelto 15mg  refill request received. Pt is 86 years old, weight-80.3 kg, Crea-1.45 on 05/15/2018-needs updated labs, last seen by Dr. 07/14/2018 on 03/15/2019, Diagnosis-Afib. Will call PCP for labs.    Called pt's Primary Physician office and was greeted with another MD name so asked if I had the correct office and she stated yes and that Dr. 13/01/2019 had passed away in 04-30-23 unexpectedly, so Dr. January had taken over the practice. Expressed sympathy and she was able to assist me with obtaining labwork that she will fax over to me.   @1052am  Labwork received and Crea-1.37 on 06/18/2019, Age-86yrs old, Wt-80.3kg, CrCl-45.54ml/min, Saw Dr. 89yr on 04/19/2019; Dose is appropriate based on dosing criteria. Will send in refill to requested pharmacy.

## 2019-12-15 ENCOUNTER — Telehealth: Payer: Self-pay | Admitting: Internal Medicine

## 2019-12-15 DIAGNOSIS — G609 Hereditary and idiopathic neuropathy, unspecified: Secondary | ICD-10-CM

## 2019-12-15 NOTE — Telephone Encounter (Signed)
Referral placed and I spoke with the pt and notified that this was done.

## 2019-12-15 NOTE — Telephone Encounter (Signed)
Spoke with the pt  He states over the past year having some numbness in legs and feet at night  States that he has mentioned this to Dr Maple [name] before  He would like referral to neuro  He states that his PCP passed away in January 09, 2021and it's complicated getting things done through their office at this time  Please advise, thanks!

## 2019-12-15 NOTE — Telephone Encounter (Signed)
Please refer to  [facility] as requested    dx peripheral neuropathy

## 2019-12-22 ENCOUNTER — Telehealth: Payer: Self-pay | Admitting: Internal Medicine

## 2019-12-22 NOTE — Telephone Encounter (Signed)
ATC patient LMTCB to get more information. It is now after 5 so will have to call Neuro tomorrow after they open

## 2019-12-23 NOTE — Telephone Encounter (Signed)
Called and spoke to pt. Pt states [facility] faxed a 'request' to our office on 8/11.   Called and spoke to [facility] and was advised the pt needs to send his PCP records to their office as the last OV note from Dr. Maple [name] is not sufficient information regarding the referring diagnosis.   Called and spoke to pt and gave him the fax number: [phone]. Pt verbalized understanding and denied any further questions or concerns at this time.

## 2019-12-24 ENCOUNTER — Encounter: Payer: Self-pay | Admitting: Neurology

## 2019-12-30 ENCOUNTER — Telehealth: Payer: Self-pay | Admitting: Cardiovascular Disease

## 2019-12-30 NOTE — Telephone Encounter (Signed)
Did not need this encounter, pt was calling for his wife, not him

## 2020-01-03 ENCOUNTER — Other Ambulatory Visit: Payer: Self-pay

## 2020-01-03 MED ORDER — DILTIAZEM HCL ER COATED BEADS 120 MG PO CP24: 120.0000 mg | ORAL_CAPSULE | Freq: Every day | ORAL | 0 refills | Status: DC

## 2020-01-31 ENCOUNTER — Telehealth: Payer: Self-pay | Admitting: Cardiovascular Disease

## 2020-01-31 NOTE — Telephone Encounter (Signed)
Patient wanted to let Dr. Clifton James know that he tripped over a couch cushion and strained his leg last Monday.  On Wed he noticed a large bruise behind his leg so Friday he went to urgent care.  They recommended an ultrasound if he develops swelling or if it gets worse.  I adv that Dr. Clifton James would adv him to follow the urgent care recommendations.  There is no swelling and the pain is improving.  He will go to ED for ultrasound if any changes.

## 2020-01-31 NOTE — Telephone Encounter (Signed)
    Pt said he is travelling in [place], he strained his left leg and behind the knee there's a lot of bruising. He went to the urgent care and he was advised he may need an ultrasound to check if there's any blood clot, but they couldn't do it in the clinic he have to go to the ED. He wanted to speak with Dr. Clifton James and discuss his condition and see if he also recommend to get an ultrasound.

## 2020-02-01 NOTE — Telephone Encounter (Signed)
Patient is calling to follow up. In order to avoid waiting in the ED, he is requesting to have an order for the ultrasound sent directly to the Vascular Ultrasound Dept of the [facility] in [place], [place]. Their phone number is [phone].

## 2020-02-01 NOTE — Telephone Encounter (Signed)
I spoke with patient. He was recently seen in Urgent Care but they cannot order an ultrasound. He would need to go to ED for this and patient would like to avoid ED if possible. He reports he has spoken with [facility] [place] and they can do ultrasound if ordered by a licensed provider.  Patient reports it is OK if provider is out of state.  Patient is concerned about possibility of blood clot due to his history.  Leg is still sore. Bruising is improving. He is taking Xarelto. Will forward to Dr Clifton James to see if he can order test.

## 2020-02-02 ENCOUNTER — Other Ambulatory Visit: Payer: Self-pay

## 2020-02-02 ENCOUNTER — Telehealth (INDEPENDENT_AMBULATORY_CARE_PROVIDER_SITE_OTHER): Payer: Medicare PPO | Admitting: Cardiovascular Disease

## 2020-02-02 ENCOUNTER — Encounter: Payer: Self-pay | Admitting: Cardiovascular Disease

## 2020-02-02 VITALS — BP 115/74 | HR 84 | Ht 71.0 in | Wt 177.0 lb

## 2020-02-02 DIAGNOSIS — I480 Paroxysmal atrial fibrillation: Secondary | ICD-10-CM | POA: Diagnosis not present

## 2020-02-02 DIAGNOSIS — M79605 Pain in left leg: Secondary | ICD-10-CM | POA: Diagnosis not present

## 2020-02-02 NOTE — Progress Notes (Signed)
Virtual Visit via Video Note   This visit type was conducted due to national recommendations for restrictions regarding the COVID-19 Pandemic (e.g. social distancing) in an effort to limit this patient's exposure and mitigate transmission in our community.  Due to his co-morbid illnesses, this patient is at least at moderate risk for complications without adequate follow up.  This format is felt to be most appropriate for this patient at this time.  All issues noted in this document were discussed and addressed.  A limited physical exam was performed with this format.  Please refer to the patient's chart for his consent to telehealth for [facility].  Converted to audio only as his camera would not work   Date:  02/02/2020   ID:  [name], DOB Sep 09, 1932, MRN [id number]  Patient Location: Home Provider Location: Office/Clinic  PCP:  Leandro Reasoner, MD  Cardiologist:  Verne Carrow, MD  Electrophysiologist:  None   Evaluation Performed:  Follow-Up Visit  Chief Complaint:  Follow up- Leg pain  History of Present Illness:    [name] is a 86 y.o. male with history ofmild aortic valve insufficiency, paroxysmalatrial fibrillation, prior PE,hyperlipidemia,vasovagal syncope,thoracic aortic aneurysm,RBBB andchronic kidney diseasewhois here today for cardiac follow up. He has a history of unprovoked pulmonary emboli diagnosed in July of 2008 and was on coumadin until this was stopped in August 2015 by Hematology. He is not known to have CAD. Nuclear stress test without ischemia December 2017. Echo January 2018 with normal LV systolic function, mild AI, trivial MR. He has passed out easily his entire life and passes out after any procedure. 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 He was seen by Dr. Everlena Cooper with Neurology and also evaluated by  Neurosurgery with no further workup planned. Dizziness felt to be due to vertigo. He was diagnosed with atrial fibrillation 07/11/16 at [facility] following episode of neck pain and dizziness. He converted to sinus in their ED. He was started on Cardizem and Xarelto. He was found to have brief runs of atrial fib on cardiac monitor April 2018 so he was continued on Xarelto.Echo November 2020 with LVEF=60-65%, grade 1 diastolic dysfunction, trace MR, trivial AI.   He called our office Monday reporting that he tripped and strained his left leg 9 days ago. He is in [place], [place] currently. He was seen in an Urgent Care 01/28/20 for evaluation. Urgent care note reviewed today through Care Everywhere. He was felt to have a hematoma from likely muscle tear. No significant swelling per notes. Low suspicion for DVT. He tells me today that the bruising persists but is improving. No swelling of the left leg.   The patient denies chest pain, dyspnea, palpitations, dizziness, near syncope or syncope. No lower extremity edema.   The patient does not have symptoms concerning for COVID-19 infection (fever, chills, cough, or new shortness of breath).    Past Medical History:  Diagnosis Date   BPH (benign prostatic hyperplasia)    CKD (chronic kidney disease), stage III    DVT (deep venous thrombosis) (HCC)    GERD (gastroesophageal reflux disease)    Hernia    Hyperlipidemia    Lung nodule    Mild aortic insufficiency    Mild dilation of ascending aorta (HCC)    PAF (paroxysmal atrial fibrillation) (HCC)  Pulmonary embolism (HCC)    RBBB    Syncope    Past Surgical History:  Procedure Laterality Date   EYE SURGERY  1957   INGUINAL HERNIA REPAIR     x 3   Left leg trauma     LIPOMA RESECTION       Current Meds  Medication Sig   alfuzosin (UROXATRAL) 10 MG 24 hr tablet Take 10 mg by mouth daily with breakfast.   Ascorbic Acid (VITAMIN C) 1000 MG tablet  Take 500 mg by mouth daily.   Cholecalciferol (VITAMIN D3) 2000 units capsule Take 2,000 Units by mouth daily.   colesevelam (WELCHOL) 625 MG tablet Take 625 mg by mouth 2 (two) times daily with a meal.    Cyanocobalamin (VITAMIN B12) 1000 MCG TBCR Take 1 tablet by mouth daily.   diltiazem (CARDIZEM CD) 120 MG 24 hr capsule Take 1 capsule (120 mg total) by mouth daily. Please make yearly appt with Dr. Clifton James for November for future refills. 1st attempt   esomeprazole (NEXIUM) 40 MG capsule Take 40 mg by mouth daily.     ezetimibe (ZETIA) 10 MG tablet Take 1 tablet (10 mg total) by mouth daily.   lubiprostone (AMITIZA) 8 MCG capsule Take 8 mcg by mouth daily.    Rivaroxaban (XARELTO) 15 MG TABS tablet Take 1 tablet (15 mg total) by mouth daily with supper.     Allergies:   Other, Tape, Anti-oxidant, Centrum, and Latex   Social History   Tobacco Use   Smoking status: Never Smoker   Smokeless tobacco: Never Used  Substance Use Topics   Alcohol use: No   Drug use: No     Family Hx: The patient's family history includes Cancer in his mother; Heart attack in his father; Heart failure in his father.  ROS:   Please see the history of present illness.    All other systems reviewed and are negative.   Prior CV studies:   The following studies were reviewed today:  Echo November 2020: 1. Left ventricular ejection fraction, by visual estimation, is 60 to  65%. The left ventricle has normal function. There is mildly increased  left ventricular hypertrophy.  2. Left ventricular diastolic parameters are consistent with Grade I  diastolic dysfunction (impaired relaxation).  3. Global right ventricle has normal systolic function.The right  ventricular size is normal. No increase in right ventricular wall  thickness.  4. Left atrial size was normal.  5. Right atrial size was normal.  6. The mitral valve is normal in structure. Trace mitral valve  regurgitation.  7.  The tricuspid valve is normal in structure. Tricuspid valve  regurgitation is not demonstrated.  8. The aortic valve is tricuspid. Aortic valve regurgitation is trivial.  Mild aortic valve sclerosis without stenosis.  9. The pulmonic valve was not well visualized. Pulmonic valve  regurgitation is not visualized.  10. Mildly elevated pulmonary artery systolic pressure.    Nuclear stress test December 2017:  Nuclear stress EF: 55%.  Blood pressure demonstrated a normal response to exercise.  There was no ST segment deviation noted during stress.  The study is normal.  This is a low risk study.  The left ventricular ejection fraction is normal (55-65%).  The patient was asymptomatic during the stress test.  Normal exercise nuclear stress test with no evidence of prior infarct or ischemia.  Good exercise capacity. Normal BP response to stress.    Labs/Other Tests and Data Reviewed:    EKG:  No ECG reviewed.  Recent Labs: No results found for requested labs within last 8760 hours.   Recent Lipid Panel Lab Results  Component Value Date/Time   CHOL  11/28/2006 03:30 AM    84        ATP III CLASSIFICATION:  <200     mg/dL   Desirable  253-664  mg/dL   Borderline High  >=403    mg/dL   High   TRIG 94 47/42/5956 03:30 AM   HDL 18 (L) 11/28/2006 03:30 AM   CHOLHDL 4.7 11/28/2006 03:30 AM   LDLCALC  11/28/2006 03:30 AM    47        Total Cholesterol/HDL:CHD Risk Coronary Heart Disease Risk Table                     Men   Women  1/2 Average Risk   3.4   3.3    Wt Readings from Last 3 Encounters:  02/02/20 177 lb (80.3 kg)  03/15/19 177 lb (80.3 kg)  08/21/18 174 lb (78.9 kg)     Objective:    Vital Signs:  BP 115/74    Pulse 84    Ht 5\' 11"  (1.803 m)    Wt 177 lb (80.3 kg)    BMI 24.69 kg/m    VITAL SIGNS:  reviewed GEN:  no acute distress  ASSESSMENT & PLAN:    1.  Aortic regurgitation: Trivial by echo November 2020.     2. Atrial fibrillation,  paroxysmal: Unable to assess today with no EKG or exam given virtual visit. Continue Cardizem and Xarelto.      3. Hyperlipidemia: Followed by primary care. Continue Welchol and Zetia.   4. Thoracic aortic aneurysm: Mild by echo in November 2018. Does not appear enlarged by echo November 2020.   5. Left leg muscle strain with bruising. Low suspicion for DVT based on his story. He is on Xarelto. Bruising improving. NO changes.   COVID-19 Education: The signs and symptoms of COVID-19 were discussed with the patient and how to seek care for testing (follow up with PCP or arrange E-visit).  The importance of social distancing was discussed today.  Time:   Today, I have spent 15 minutes with the patient with telehealth technology discussing the above problems.     Medication Adjustments/Labs and Tests Ordered: Current medicines are reviewed at length with the patient today.  Concerns regarding medicines are outlined above.   Tests Ordered: No orders of the defined types were placed in this encounter.   Medication Changes: No orders of the defined types were placed in this encounter.   Disposition:  Follow up with me as planned  Signed, December 2020, MD  02/02/2020 3:01 PM    [facility]

## 2020-02-02 NOTE — Telephone Encounter (Signed)
Spoke with patient who is in agreement to virtual visit (Video).  Reset his MyChart password so that he can access MyChart. Telehealth consent expired.  Sent through [employer].

## 2020-02-02 NOTE — Telephone Encounter (Signed)
Can we check with him and see if his bruising is getting better? If bruising and pain is improving, this likely does not represent a DVT. I would not recommend an u/s. If the pain and bruising is getting worse, would recommend he hold Xarelto and go to an ED for evaluation. Thayer Ohm

## 2020-02-02 NOTE — Telephone Encounter (Addendum)
Left message for patient to call back.   Dr. Clifton James recommends a virtual (VIDEO) visit this afternoon at 3:00 (DOD).

## 2020-02-02 NOTE — Patient Instructions (Signed)
Medication Instructions:  NO CHANGES *If you need a refill on your cardiac medications before your next appointment, please call your pharmacy*   Lab Work: NONE If you have labs (blood work) drawn today and your tests are completely normal, you will receive your results only by: [name] MyChart Message (if you have MyChart) OR . A paper copy in the mail If you have any lab test that is abnormal or we need to change your treatment, we will call you to review the results.   Testing/Procedures: NONE   Follow-Up:  AS PLANNED

## 2020-03-06 ENCOUNTER — Ambulatory Visit: Payer: Medicare PPO | Admitting: Cardiovascular Disease

## 2020-03-06 ENCOUNTER — Encounter: Payer: Self-pay | Admitting: Cardiovascular Disease

## 2020-03-06 ENCOUNTER — Other Ambulatory Visit: Payer: Self-pay

## 2020-03-06 VITALS — BP 110/66 | HR 93 | Ht 71.0 in | Wt 175.4 lb

## 2020-03-06 DIAGNOSIS — I480 Paroxysmal atrial fibrillation: Secondary | ICD-10-CM | POA: Diagnosis not present

## 2020-03-06 DIAGNOSIS — I712 Thoracic aortic aneurysm, without rupture, unspecified: Secondary | ICD-10-CM

## 2020-03-06 DIAGNOSIS — I351 Nonrheumatic aortic (valve) insufficiency: Secondary | ICD-10-CM

## 2020-03-06 DIAGNOSIS — E7800 Pure hypercholesterolemia, unspecified: Secondary | ICD-10-CM

## 2020-03-06 NOTE — Patient Instructions (Signed)

## 2020-03-06 NOTE — Progress Notes (Signed)
Chief Complaint  Patient presents with  . Follow-up    PAF   History of Present Illness: 86 yo male with history of mild aortic valve insufficiency, paroxysmalatrial fibrillation, prior PE, hyperlipidemia, vasovagal syncope,thoracic aortic aneurysm,RBBB and chronic kidney disease who is here today for cardiac follow up. He has a history of unprovoked pulmonary emboli diagnosed in July of 2008 and was on coumadin until this was stopped in August 2015 by Hematology. He is not known to have CAD. Nuclear stress test without ischemia December 2017. Echo January 2018 with normal LV systolic function, mild AI, trivial MR. He has passed out easily his entire life and passes out after any procedure. 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 He was seen by Dr. Everlena Cooper with Neurology and also evaluated by Neurosurgery. No further workup planned. Dizziness felt to be due to vertigo. He was diagnosed with atrial fibrillation 07/11/16 at [facility] following episode of neck pain and dizziness. He converted to sinus in their ED. He was started on Cardizem and Xarelto. He was found to have brief runs of atrial fib on cardiac monitor April 2018 so he was continued on Xarelto.   He is here today for follow up. The patient denies any chest pain, dyspnea, palpitations, lower extremity edema, orthopnea, PND, dizziness, near syncope or syncope. He is feeling well. They just got back from driving their RV across the country to see his grandchild.   Primary Care Physician: Patient, No Pcp Per  Past Medical History:  Diagnosis Date  . BPH (benign prostatic hyperplasia)   . CKD (chronic kidney disease), stage III (HCC)   . DVT (deep venous thrombosis) (HCC)   . GERD (gastroesophageal reflux disease)   . Hernia   . Hyperlipidemia   . Lung nodule   . Mild aortic insufficiency   . Mild  dilation of ascending aorta (HCC)   . PAF (paroxysmal atrial fibrillation) (HCC)   . Pulmonary embolism (HCC)   . RBBB   . Syncope     Past Surgical History:  Procedure Laterality Date  . EYE SURGERY  1957  . INGUINAL HERNIA REPAIR     x 3  . Left leg trauma    . LIPOMA RESECTION      Current Outpatient Medications  Medication Sig Dispense Refill  . alfuzosin (UROXATRAL) 10 MG 24 hr tablet Take 10 mg by mouth daily with breakfast.    . Ascorbic Acid (VITAMIN C) 1000 MG tablet Take 500 mg by mouth daily.    . Cholecalciferol (VITAMIN D3) 2000 units capsule Take 2,000 Units by mouth daily.    . colesevelam (WELCHOL) 625 MG tablet Take 625 mg by mouth 2 (two) times daily with a meal.     . Cyanocobalamin (VITAMIN B12) 1000 MCG TBCR Take 1 tablet by mouth daily.    [name] diltiazem (CARDIZEM CD) 120 MG 24 hr capsule Take 1 capsule (120 mg total) by mouth daily. Please make yearly appt with Dr. Clifton James for November for future refills. 1st attempt 90 capsule 0  . esomeprazole (NEXIUM) 40 MG capsule Take 40 mg by mouth daily.      [name] ezetimibe (ZETIA) 10 MG tablet Take 1 tablet (10 mg total) by mouth daily. 30 tablet 11  . lubiprostone (AMITIZA) 8 MCG capsule Take 8 mcg by mouth daily.     [name]  Rivaroxaban (XARELTO) 15 MG TABS tablet Take 1 tablet (15 mg total) by mouth daily with supper. 90 tablet 2   No current facility-administered medications for this visit.    Allergies  Allergen Reactions  . Other Rash  . Tape Rash  . Anti-Oxidant     REACTION: intol gi upset  . Centrum     GI upset  . Latex Rash    Social History   Socioeconomic History  . Marital status: Married    Spouse name: Not on file  . Number of children: 2  . Years of education: Not on file  . Highest education level: Not on file  Occupational History  . Occupation: Research officer, political party  Tobacco Use  . Smoking status: Never Smoker  . Smokeless tobacco: Never Used  Substance and Sexual  Activity  . Alcohol use: No  . Drug use: No  . Sexual activity: Not on file  Other Topics Concern  . Not on file  Social History Narrative  . Not on file   Social Determinants of Health   Financial Resource Strain:   . Difficulty of Paying Living Expenses: Not on file  Food Insecurity:   . Worried About Programme researcher, broadcasting/film/video in the Last Year: Not on file  . Ran Out of Food in the Last Year: Not on file  Transportation Needs:   . Lack of Transportation (Medical): Not on file  . Lack of Transportation (Non-Medical): Not on file  Physical Activity:   . Days of Exercise per Week: Not on file  . Minutes of Exercise per Session: Not on file  Stress:   . Feeling of Stress : Not on file  Social Connections:   . Frequency of Communication with Friends and Family: Not on file  . Frequency of Social Gatherings with Friends and Family: Not on file  . Attends Religious Services: Not on file  . Active Member of Clubs or Organizations: Not on file  . Attends Banker Meetings: Not on file  . Marital Status: Not on file  Intimate Partner Violence:   . Fear of Current or Ex-Partner: Not on file  . Emotionally Abused: Not on file  . Physically Abused: Not on file  . Sexually Abused: Not on file    Family History  Problem Relation Age of Onset  . Heart failure Father   . Heart attack Father        x2  . Cancer Mother        of eye-causing her to lose her eye    Review of Systems:  As stated in the HPI and otherwise negative.   BP 110/66   Pulse 93   Ht 5\' 11"  (1.803 m)   Wt 175 lb 6.4 oz (79.6 kg)   SpO2 98%   BMI 24.46 kg/m   Physical Examination:  General: Well developed, well nourished, NAD  HEENT: OP clear, mucus membranes moist  SKIN: warm, dry. No rashes. Neuro: No focal deficits  Musculoskeletal: Muscle strength 5/5 all ext  Psychiatric: Mood and affect normal  Neck: No JVD, no carotid bruits, no thyromegaly, no lymphadenopathy.  Lungs:Clear bilaterally,  no wheezes, rhonci, crackles Cardiovascular: Regular rate and rhythm. No murmurs, gallops or rubs. Abdomen:Soft. Bowel sounds present. Non-tender.  Extremities: No lower extremity edema. Pulses are 2 + in the bilateral DP/PT.  Echo 04/04/17: - Left ventricle: The cavity size was normal. Wall thickness was   normal. Systolic function was normal. The estimated  ejection   fraction was in the range of 55% to 60%. Wall motion was normal;   there were no regional wall motion abnormalities. Doppler   parameters are consistent with abnormal left ventricular   relaxation (grade 1 diastolic dysfunction). - Aortic valve: There was mild regurgitation. - Ascending aorta: The ascending aorta was mildly dilated.  Impressions:  - Normal LV systolic function; mild diastolic dysfunction; mild AI;   mildly dilated ascending aorta.  Nuclear stress test December 2017:  Nuclear stress EF: 55%.  Blood pressure demonstrated a normal response to exercise.  There was no ST segment deviation noted during stress.  The study is normal.  This is a low risk study.  The left ventricular ejection fraction is normal (55-65%).  The patient was asymptomatic during the stress test.   Normal exercise nuclear stress test with no evidence of prior infarct or ischemia.  Good exercise capacity. Normal BP response to stress.   EKG:  EKG is ordered today. The EKG demonstrates Sinus, RBBB  Recent Labs: No results found for requested labs within last 8760 hours.    Wt Readings from Last 3 Encounters:  03/06/20 175 lb 6.4 oz (79.6 kg)  02/02/20 177 lb (80.3 kg)  03/15/19 177 lb (80.3 kg)     Other studies Reviewed: Additional studies/ records that were reviewed today include: . Review of the above records demonstrates:   Assessment and Plan:   1.  Aortic regurgitation: Mild by echo in 2020. Repeat echo in November 2022.    2. Atrial fibrillation, paroxysmal: He is in sinus today. Continue Cardizem and  Xarelto.   3. Hyperlipidemia: Followed by primary care. Lipids controlled per pt. Will continue Welchol and Zetia.   4. Thoracic aortic aneurysm: Mild by echo in November 2018. Does not appear enlarged by echo in 2020.   Current medicines are reviewed at length with the patient today.  The patient does not have concerns regarding medicines.  The following changes have been made:  no change  Labs/ tests ordered today include:   Orders Placed This Encounter  Procedures  . EKG 12-Lead     Disposition:   FU with me in 12 months   Signed, Verne Carrow, MD 03/06/2020 3:19 PM    [facility] [address] [place], [place], [place]  [address] Phone: [phone]; Fax: [phone]

## 2020-03-20 ENCOUNTER — Ambulatory Visit: Payer: Medicare PPO | Admitting: Neurology

## 2020-03-20 ENCOUNTER — Other Ambulatory Visit: Payer: Self-pay

## 2020-03-20 ENCOUNTER — Encounter: Payer: Self-pay | Admitting: Neurology

## 2020-03-20 VITALS — BP 116/75 | HR 85 | Ht 71.0 in | Wt 175.0 lb

## 2020-03-20 DIAGNOSIS — G629 Polyneuropathy, unspecified: Secondary | ICD-10-CM | POA: Diagnosis not present

## 2020-03-20 NOTE — Patient Instructions (Addendum)
Check your feet daily  Use hand rails in the bathroom  Take extra caution on uneven ground  Return to clinic in 1 year

## 2020-03-20 NOTE — Progress Notes (Signed)
[facility] Clinic Note - Initial Visit   Date: 03/20/20  SEANN GENTHER MRN: [id number] DOB: 1932/10/08   Dear Dr. Maple [name]:  Thank you for your kind referral of ARMAS MCBEE for consultation of neuropathy. Although his history is well known to you, please allow [place] to reiterate it for the purpose of our medical record. The patient was accompanied to the clinic by self.  History of Present Illness: [name] is a 86 y.o. right-handed male with vagus nerve hypersensitivity, paroxsymal atrial fibrillation, hypertension, CKD, and history of PE presenting for evaluation of neuropathy.  He has numbness which started in his feet around 2015 and over the past several years, numbness has extended into his lower legs, up to mid-calf.  Numbness is constant, more noticeable at night time. There is no tingling or pain.  Rarely, he gets heat sensation at the sole of the feet, this is very transient. He has some imbalance. He walks unassisted and has not had any falls. No history of diabetes, alcohol, or family history of neuropathy.  Prior testing testing for secondary causes of neuropathy was normal in 2015 (SPEP, ANA, B12, TSH).   Out-side paper records, electronic medical record, and images have been reviewed where available and summarized as:  No results found for: HGBA1C Lab Results  Component Value Date   VITAMINB12 660 09/01/2013   Lab Results  Component Value Date   TSH 1.955 09/01/2013   Lab Results  Component Value Date   ESRSEDRATE 1 09/01/2013    Past Medical History:  Diagnosis Date  . BPH (benign prostatic hyperplasia)   . CKD (chronic kidney disease), stage III (HCC)   . DVT (deep venous thrombosis) (HCC)   . GERD (gastroesophageal reflux disease)   . Hernia   . Hyperlipidemia   . Lung nodule   . Mild aortic insufficiency   . Mild dilation of ascending aorta (HCC)   . PAF (paroxysmal atrial fibrillation) (HCC)   . Pulmonary  embolism (HCC)   . RBBB   . Syncope     Past Surgical History:  Procedure Laterality Date  . EYE SURGERY  1957  . INGUINAL HERNIA REPAIR     x 3  . Left leg trauma    . LIPOMA RESECTION       Medications:  Outpatient Encounter Medications as of 03/20/2020  Medication Sig  . alfuzosin (UROXATRAL) 10 MG 24 hr tablet Take 10 mg by mouth daily with breakfast.  . Ascorbic Acid (VITAMIN C) 1000 MG tablet Take 500 mg by mouth daily.  . Cholecalciferol (VITAMIN D3) 2000 units capsule Take 2,000 Units by mouth daily.  . colesevelam (WELCHOL) 625 MG tablet Take 625 mg by mouth 2 (two) times daily with a meal.   . Cyanocobalamin (VITAMIN B12) 1000 MCG TBCR Take 1 tablet by mouth daily.  [name] diltiazem (CARDIZEM CD) 120 MG 24 hr capsule Take 1 capsule (120 mg total) by mouth daily. Please make yearly appt with Dr. Clifton James for November for future refills. 1st attempt  . esomeprazole (NEXIUM) 40 MG capsule Take 40 mg by mouth daily.    [name] ezetimibe (ZETIA) 10 MG tablet Take 1 tablet (10 mg total) by mouth daily.  [name] lubiprostone (AMITIZA) 8 MCG capsule Take 8 mcg by mouth daily.   . Rivaroxaban (XARELTO) 15 MG TABS tablet Take 1 tablet (15 mg total) by mouth daily with supper.   No facility-administered encounter medications on file as of 03/20/2020.  Allergies:  Allergies  Allergen Reactions  . Other Rash  . Tape Rash  . Anti-Oxidant     REACTION: intol gi upset  . Centrum     GI upset  . Latex Rash    Family History: Family History  Problem Relation Age of Onset  . Heart failure Father   . Heart attack Father        x2  . Cancer Mother        of eye-causing her to lose her eye    Social History: Social History   Tobacco Use  . Smoking status: Never Smoker  . Smokeless tobacco: Never Used  Vaping Use  . Vaping Use: Never used  Substance Use Topics  . Alcohol use: No  . Drug use: No   Social History   Social History Narrative   Right Handed    Lives in a one  story home   Drinks Caffeine     Vital Signs:  BP 116/75   Pulse 85   Ht 5\' 11"  (1.803 m)   Wt 175 lb (79.4 kg)   SpO2 96%   BMI 24.41 kg/m    Neurological Exam: MENTAL STATUS including orientation to time, place, person, recent and remote memory, attention span and concentration, language, and fund of knowledge is normal.  Speech is not dysarthric.  CRANIAL NERVES: II:  No visual field defects.     III-IV-VI: Pupils equal round and reactive.  Normal conjugate, extra-ocular eye movements in all directions of gaze.  No nystagmus.  No ptosis.   V:  Normal facial sensation.    VII:  Normal facial symmetry and movements.   VIII:  Normal hearing and vestibular function.   IX-X:  Normal palatal movement.   XI:  Normal shoulder shrug and head rotation.    MOTOR:  No atrophy, fasciculations or abnormal movements.  No pronator drift.   Upper Extremity:  Right  Left  Deltoid  5/5   5/5   Biceps  5/5   5/5   Triceps  5/5   5/5   Infraspinatus 5/5  5/5  Medial pectoralis 5/5  5/5  Wrist extensors  5/5   5/5   Wrist flexors  5/5   5/5   Finger extensors  5/5   5/5   Finger flexors  5/5   5/5   Dorsal interossei  5/5   5/5   Abductor pollicis  5/5   5/5   Tone (Ashworth scale)  0  0   Lower Extremity:  Right  Left  Hip flexors  5/5   5/5   Hip extensors  5/5   5/5   Adductor 5/5  5/5  Abductor 5/5  5/5  Knee flexors  5/5   5/5   Knee extensors  5/5   5/5   Dorsiflexors  5/5   5/5   Plantarflexors  5/5   5/5   Toe extensors  5/5   5/5   Toe flexors  5/5   5/5   Tone (Ashworth scale)  0  0   MSRs:  Right        Left                  brachioradialis 2+  2+  biceps 2+  2+  triceps 2+  2+  patellar 2+  2+  ankle jerk 0  0  Hoffman no  no  plantar response down  down   SENSORY:  Vibration reduced at the great  toe and ankle bilaterally, normal at the knees.  Pin prick and temperature mildly reduced distally.  Romberg's sign is present.   COORDINATION/GAIT: Normal  finger-to- nose-finger.  Intact rapid alternating movements bilaterally.  Able to rise from a chair without using arms.  Gait narrow based and stable. Stressed gait intact, mild unsteadiness with tandem gait.   IMPRESSION: Idiopathic peripheral neuropathy. His neurological examination shows a distal predominant large fiber peripheral neuropathy. I had extensive discussion with the patient regarding the pathogenesis, etiology, management, and natural course of neuropathy. Neuropathy tends to be slowly progressive, especially if a treatable etiology is not identified.  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   I discussed that in the vast majority of cases, we are unable to find the underlying etiology and management is symptomatic.   He does not have pain, therefore no role for medications Take extra care on uneven ground, use a walking stick Patient educated on daily foot inspection, fall prevention, and safety precautions around the home.  Return to clinic in 1 year   Thank you for allowing me to participate in patient's care.  If I can answer any additional questions, I would be pleased to do so.    Sincerely,    Kent Riendeau K. Allena Katz, DO

## 2020-03-22 NOTE — Progress Notes (Signed)
Patient ID: [name], male    DOB: 1932/07/14, 86 y.o.   MRN: [id number]  HPI 40 yoM never smoker with past hx of DVT/ PE( 2013, Lupus anticoagulant) and bronchitis, complicated by hx of lung nodule, GERD, vagalinstability, PAFib,  FENO- 03/13/16- 25- borderline for allergic asthma , possibly normal. ----------------------------------------------------------------------------------------------------   04/22/2019- Virtual Visit via Telephone Note  History of Present Illness: 86 year old male never smoker with past history DVT/PE( 2013, Lupus aticoagulant), bronchitis, complicated by history lung nodule, GERD, vagal instability, CKD 3, PA. Fib, -----f/u pulmonary embolism/ Bronchitis Remains on Xarelto with no bleeding. Feels breathing is stable over past year with no acute issues. PCP treated a URI/ cold with zinc and vitC. Numbness in feet being eval for peripheral neuropathy He wants to maintain contact with [place].  Observations/Objective: Echo 03/15/2019-  1. Left ventricular ejection fraction, by visual estimation, is 60 to 65%. The left ventricle has normal function. There is mildly increased left ventricular hypertrophy.  2. Left ventricular diastolic parameters are consistent with Grade I diastolic dysfunction (impaired relaxation).  3. Global right ventricle has normal systolic function.The right ventricular size is normal. No increase in right ventricular wall thickness.  4. Left atrial size was normal.  5. Right atrial size was normal.  6. The mitral valve is normal in structure. Trace mitral valve regurgitation.  7. The tricuspid valve is normal in structure. Tricuspid valve regurgitation is not demonstrated.  8. The aortic valve is tricuspid. Aortic valve regurgitation is trivial. Mild aortic valve sclerosis without stenosis.  9. The pulmonic valve was not well visualized. Pulmonic valve regurgitation is not visualized. 10. Mildly elevated pulmonary artery systolic  pressure.  Assessment and Plan: DVT/ PE w/ Lupus anticoagulant- to continue chronic Xarelto  Follow Up Instructions: 1 year    03/23/20- 86 year old male never smoker with past history DVT/PE( 2013, +Lupus aticoagulant), Bronchitis, complicated by history lung nodule, GERD, vagal instability, CKD 3, PA.Fib, Gr1DD,  Covide vax- 3 Moderna Flu vax- today senior pt recently saw neurlogist about neuropathy;  stated no changes on overall health.  No acute respiratory events. Discussed Covid vaccination. His PCP died, pending first visit with new one soon. I suggested he discuss Xarelto length of treatment with new MD and cardiology. Remote hx PE with lupus anticoagulant and hx paroxysmal AFib. No bleeding. Recently drove to [facility] and back with no problems. He still wants to maintain contact here.  ROS-see HPI  + = positive Constitutional:   No-   weight loss, night sweats, fevers, chills, fatigue, lassitude. No bleeding HEENT:   No-  headaches, difficulty swallowing, tooth/dental problems, sore throat,       No-  sneezing, itching, ear ache, + nasal congestion, post nasal drip,  CV:  No-   chest pain, orthopnea, PND, swelling in lower extremities, anasarca, dizziness, palpitations Resp: No-   shortness of breath with exertion or at rest.              No-   productive cough,  No non-productive cough,  No- coughing up of blood.              No-   change in color of mucus.  No- wheezing.   Skin: No-   rash or lesions. GI:  No-   heartburn, indigestion, abdominal pain, nausea, vomiting,  GU: MS:  No-   joint pain or swelling.  Neuro-     ? Vagal instability- eval by his other physicians. Psych:  No- change in  mood or affect. No depression or anxiety.  No memory loss.  OBJ- Physical Exam General- Alert, Oriented, Affect-appropriate, Distress- none acute. Looks well Skin- rash-none, lesions- none, excoriation- none Lymphadenopathy- none Head- atraumatic            Eyes- Gross vision intact,  PERRLA, conjunctivae and secretions clear            Ears- Hearing, canals-normal            Nose- Clear, +Septal dev and dev external nose,  No-mucus, polyps, erosion, perforation             Throat- Mallampati II , mucosa clear , drainage- none, tonsils- atrophic Neck- flexible , trachea midline, no stridor , thyroid nl, carotid no bruit Chest - symmetrical excursion , unlabored           Heart/CV- RRR , no murmur , no gallop  , no rub, nl s1 s2                           - JVD- none , edema- none, stasis changes- none, varices- none           Lung- clear, mild,  wheeze-none, cough- none , dullness-none, rub- none           Chest wall-  Abd-  Br/ Gen/ Rectal- Not done, not indicated Extrem- cyanosis- none, clubbing, none, atrophy- none, strength- nl Neuro- grossly intact to observation

## 2020-03-23 ENCOUNTER — Encounter: Payer: Self-pay | Admitting: Internal Medicine

## 2020-03-23 ENCOUNTER — Other Ambulatory Visit: Payer: Self-pay

## 2020-03-23 ENCOUNTER — Ambulatory Visit: Payer: Medicare PPO | Admitting: Internal Medicine

## 2020-03-23 DIAGNOSIS — I2699 Other pulmonary embolism without acute cor pulmonale: Secondary | ICD-10-CM | POA: Diagnosis not present

## 2020-03-23 DIAGNOSIS — Z23 Encounter for immunization: Secondary | ICD-10-CM

## 2020-03-23 DIAGNOSIS — T45515A Adverse effect of anticoagulants, initial encounter: Secondary | ICD-10-CM

## 2020-03-23 DIAGNOSIS — I480 Paroxysmal atrial fibrillation: Secondary | ICD-10-CM | POA: Diagnosis not present

## 2020-03-23 NOTE — Assessment & Plan Note (Signed)
No recurrence. At his age, it may be reasonable to change to maintenance ASA, but he can discuss with PCP and cardiology

## 2020-03-23 NOTE — Patient Instructions (Signed)
Order- Flu vax senior  As you establish with your new primary doctor and as you see your cardiologist, you can ask opinion about how long to continue Xarelto.  Please call if we can help

## 2020-03-23 NOTE — Assessment & Plan Note (Signed)
Not sure when cardiology may choose to take him off Xarelto. He can discuss at next visit.

## 2020-04-04 ENCOUNTER — Other Ambulatory Visit: Payer: Self-pay | Admitting: Cardiovascular Disease

## 2020-04-05 ENCOUNTER — Other Ambulatory Visit: Payer: Self-pay | Admitting: Cardiovascular Disease

## 2020-04-05 NOTE — Telephone Encounter (Signed)
Xarelto 15mg  refill request received. Pt is 86 years old, weight-79.2kg, Crea-1.37 on 06/18/2019 via scanned labs from [place], last seen by Dr. Brighton on 03/06/2020, Diagnosis-Afib, CrCl-44.36ml/min; Dose is appropriate based on dosing criteria. Will send in refill to requested pharmacy.

## 2020-04-05 NOTE — Telephone Encounter (Signed)
Patient is calling to check on the status of both refill requests

## 2020-05-19 ENCOUNTER — Telehealth: Payer: Self-pay | Admitting: Cardiovascular Disease

## 2020-05-19 NOTE — Telephone Encounter (Signed)
[name] is calling requesting a callback from a nurse in regards to needing to schedule an MRI. Please advise.
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
## 2020-05-23 NOTE — Telephone Encounter (Signed)
No other recs. Thanks

## 2020-07-06 ENCOUNTER — Other Ambulatory Visit: Payer: Self-pay | Admitting: Cardiovascular Disease

## 2020-07-06 NOTE — Telephone Encounter (Signed)
Pt last saw Dr Clifton James 03/06/20, last labs 04/12/20 Creat 1.52 at [facility] per care everywhere, age 87, weight 79.2kg, CrCl 39.8, based on CrCl pt is on appropriate dosage of Xarelto 15mg  QD for afib.  Will refill rx.

## 2020-10-30 ENCOUNTER — Telehealth: Payer: Self-pay | Admitting: Cardiovascular Disease

## 2020-10-30 NOTE — Telephone Encounter (Signed)
Called patient with Dr. Gibson Ramp response. Per Dr. Clifton James, Agree, the health system and CDC is still recommending boosters. Patient verbalized understanding.

## 2020-10-30 NOTE — Telephone Encounter (Signed)
New Message:   Pt wants to know if Dr Clifton James think he should take the second Booster for Covid?

## 2020-10-30 NOTE — Telephone Encounter (Signed)
Called patient back. Patient wants to make sure it's okay for him to get [employer]. Informed patient that most of our doctors are recommending patient's get the booster, but will send message to Dr. Clifton James for advisement.

## 2020-12-24 ENCOUNTER — Other Ambulatory Visit: Payer: Self-pay | Admitting: Cardiovascular Disease

## 2020-12-25 NOTE — Telephone Encounter (Signed)
Xarelto 15 mg refill request received. Pt is 87 years old, weight- 79.2 kg, Crea- 1.61 on 05/18/20 via epic, last seen by Dr. Clifton James on 03/06/20, Diagnosis-afib, CrCl- 37.58; Dose is appropriate based on dosing criteria. Will send in refill to requested pharmacy.

## 2021-01-08 ENCOUNTER — Other Ambulatory Visit: Payer: Self-pay

## 2021-01-08 ENCOUNTER — Emergency Department
Admission: EM | Admit: 2021-01-08 | Discharge: 2021-01-08 | Disposition: A | Discharge status: Home / Self Care | Payer: Medicare PPO | Attending: Emergency Medicine | Admitting: Emergency Medicine

## 2021-01-08 ENCOUNTER — Emergency Department (EMERGENCY_DEPARTMENT_HOSPITAL): Payer: Medicare PPO

## 2021-01-08 DIAGNOSIS — I4891 Unspecified atrial fibrillation: Secondary | ICD-10-CM | POA: Insufficient documentation

## 2021-01-08 DIAGNOSIS — M48061 Spinal stenosis, lumbar region without neurogenic claudication: Secondary | ICD-10-CM

## 2021-01-08 DIAGNOSIS — M5136 Other intervertebral disc degeneration, lumbar region: Secondary | ICD-10-CM | POA: Insufficient documentation

## 2021-01-08 DIAGNOSIS — Z7901 Long term (current) use of anticoagulants: Secondary | ICD-10-CM | POA: Insufficient documentation

## 2021-01-08 DIAGNOSIS — S39012A Strain of muscle, fascia and tendon of lower back, initial encounter: Secondary | ICD-10-CM

## 2021-01-08 DIAGNOSIS — M47816 Spondylosis without myelopathy or radiculopathy, lumbar region: Secondary | ICD-10-CM

## 2021-01-08 MED ORDER — SODIUM CHLORIDE 0.9% IV BOLUS
500.0000 mL | Freq: Once | INTRAVENOUS | Status: AC
Start: 2021-01-08 — End: 2021-01-08
  Administered 2021-01-08: 500 mL via INTRAVENOUS

## 2021-01-08 MED ORDER — DIAZEPAM 5 MG OR TABS
5.0000 mg | ORAL_TABLET | Freq: Once | ORAL | Status: AC
Start: 2021-01-08 — End: 2021-01-08
  Administered 2021-01-08: 5 mg via ORAL
  Filled 2021-01-08: qty 1

## 2021-01-08 MED ORDER — ONDANSETRON HCL 4 MG/2ML IJ SOLN
4.0000 mg | Freq: Once | INTRAMUSCULAR | Status: AC
Start: 2021-01-08 — End: 2021-01-08
  Administered 2021-01-08: 4 mg via INTRAVENOUS
  Filled 2021-01-08: qty 2

## 2021-01-08 MED ORDER — IBUPROFEN 400 MG OR TABS
400.0000 mg | ORAL_TABLET | Freq: Four times a day (QID) | ORAL | 0 refills | Status: AC | PRN
Start: 2021-01-08 — End: ?

## 2021-01-08 MED ORDER — PREDNISONE 20 MG OR TABS
40.0000 mg | ORAL_TABLET | Freq: Every day | ORAL | 0 refills | Status: AC
Start: 2021-01-08 — End: ?

## 2021-01-08 MED ORDER — HYDROMORPHONE HCL 1 MG/ML IJ SOLN
1.0000 mg | Freq: Once | INTRAMUSCULAR | Status: AC
Start: 2021-01-08 — End: 2021-01-08
  Administered 2021-01-08: 1 mg via INTRAVENOUS
  Filled 2021-01-08: qty 1

## 2021-01-08 MED ORDER — HYDROCODONE-ACETAMINOPHEN 5-325 MG OR TABS
1.0000 | ORAL_TABLET | ORAL | 0 refills | Status: AC | PRN
Start: 2021-01-08 — End: ?

## 2021-01-08 MED ORDER — METHOCARBAMOL 750 MG OR TABS
750.0000 mg | ORAL_TABLET | Freq: Four times a day (QID) | ORAL | 0 refills | Status: AC | PRN
Start: 2021-01-08 — End: ?

## 2021-01-08 NOTE — ED Provider Notes (Signed)
CHIEF COMPLAINT   Chief Complaint   Patient presents with    Back Pain            HISTORY OF PRESENT ILLNESS   HPI   Patient states drove from [place] to [place] arrived over a week ago.  Patient notes 2-day history of increasing right lumbar back pain.  No radiation to lower extremities.  Pain made worse with movement.  No bowel bladder incontinence.  No fevers.  No significant history for back issues patient does have atrial fibrillation is on Xarelto.  No chest pain.  No shortness of breath.  No nausea or vomiting.  No abdominal pain.  No fevers                PAST MEDICAL AND SURGICAL HISTORY   Atrial fibrillation         SOCIAL HISTORY    None     PAST FAMILY HISTORY     Unknown     ALLERGIES   Review of patient's allergies indicates:  No Known Allergies       REVIEW OF SYSTEMS   Review of Systems   Constitutional: Negative for fever and chills.   HENT: Negative for ear pain and sore throat.    Eyes: Negative for pain and visual disturbance.   Respiratory: Negative for shortness of breath and cough.    Cardiovascular: Negative for chest pain and palpitations.   Gastrointestinal: Negative for abdominal pain and vomiting.   Genitourinary: Negative for dysuria and hematuria.   Musculoskeletal: Positive for back pain. Negative for arthralgias.   Skin: Negative for rash and color change.   Neurological: Negative for seizures and syncope.   All other systems reviewed and are negative.             PHYSICAL EXAM   ED VITALS:     Vitals (Arrival)      T: 36.2 C (01/08/21 1643)  BP: 117/67 (01/08/21 1643)  HR: 72 (01/08/21 1643)  RR: 18 (01/08/21 1643)  SpO2: 97 % (01/08/21 1643)     Vitals (Most recent in last 24 hrs)   T: 36.2 C (01/08/21 1643)  BP: 117/67 (01/08/21 1643)  HR: 72 (01/08/21 1643)  RR: 18 (01/08/21 1643)  SpO2: 97 % (01/08/21 1643)    T range: Temp  Min: 36.2 C  Max: 36.2 C  (no weight taken for this visit)     (no height taken for this visit)     There is no height or weight on file to  calculate BMI.       Physical Exam  Vitals and nursing note reviewed.   Constitutional:       Appearance: He is well-developed.   HENT:      Head: Normocephalic and atraumatic.   Eyes:      Conjunctiva/sclera: Conjunctivae normal.   Neck:      Musculoskeletal: Neck supple.   Cardiovascular:      Rate and Rhythm: Normal rate and regular rhythm.      Heart sounds: No murmur heard.  Pulmonary:      Effort: Pulmonary effort is normal. No respiratory distress.      Breath sounds: Normal breath sounds.   Abdominal:      Palpations: Abdomen is soft.      Tenderness: There is no abdominal tenderness.   Musculoskeletal:      Cervical back: Neck supple.      Comments: Tenderness right lumbar region.    Bilateral lower  extremities: Neurovascularly intact/5 out of 5 strength/full range of motion   Skin:     General: Skin is warm and dry.   Neurological:      Mental Status: He is alert.           LABORATORY:   Labs Reviewed - No data to display      IMAGING:     ED Wet Read -   MRI L Spine wo Contrast    (Results Pending)       Radiology Final Result -   No image results found.                ED COURSE/MEDICAL DECISION MAKING     ED Course as of 01/08/21 2133   Mon Jan 08, 2021   2133 MRI L-spine:    IMPRESSION  1. Moderate degenerative changes at multiple levels worst at L4-L5 where severe facet arthrosis and mild degenerative disc disease combine to result in severe right neural foraminal narrowing which affects the exiting right L4 nerve root.  2. Small endplate deformity involving the L2 vertebral body demonstrates mild edema and may be symptomatic.   [RW]      ED Course User Index  [RW] Hunt Oris, MD       Patient presenting with right lumbar back pain.    Differential diagnosis includes strain versus degenerative disc disease versus compression fracture versus disc herniation.    MRI results reviewed.    IV.  IV fluids.  Dilaudid.  P.o. Valium.    Patient informed of findings.    Patient given prescription  for ibuprofen and prednisone.    Patient referred to neurosurgery services.    Patient informed if having increasing pain would need to return to ER for evaluation.                   ADDITIONAL INFORMATION REVIEWED  - ATTENDING ONLY           Hunt Oris, MD  01/08/21 2146

## 2021-01-08 NOTE — ED Triage Notes (Signed)
Pt c/o right lower back pain, denies injury, denies urinary sx. Pt states he woke up with the pain this morning. Took tylenol with some relief.

## 2021-01-11 ENCOUNTER — Ambulatory Visit (HOSPITAL_BASED_OUTPATIENT_CLINIC_OR_DEPARTMENT_OTHER): Payer: Self-pay

## 2021-01-11 NOTE — Telephone Encounter (Signed)
Call Type:  Triage Call    Caller Name:  Aaron Huang    Facility Name:  [facility] Facility [id number]    Presenting Problem:  Is out of town needs advice on new medications given in ER in [place]    Assessment:  01. Symptoms: What are your symptoms? Was in ER and given meds, hip pain.  MRI and due to see MD for follow up. Prednisone 10 tablets, 1 per day. Can I stop now that feeling better?  02. Onset: When did your symptoms/concern begin? (minutes, hours, days, or months ago?) uto  03. Action: What actions or treatments have you taken to treat your symptoms/concern? uto  04. Response: What was the response to your actions taken? uto  05. Severity: How severe are your symptoms? (ex. interference with normal activities, pain scale, size or appearance of injury) uto    Triage Note:  Advice on prenisone and robaxin PRN. Advised to take as ordered and not to stop prednisone as he is on day 3 of 10 and feels better.    Guideline Title:  Medication Question Call (Adult)    Guideline Question:  [1] Caller has medicine question only, adult not sick, AND triager answers question? Yes    Recommended Disposition:  Information or Advice Only Call    Original Inclination:  Called your Doctor (Dentist, Pharmacist)    Intended Action:  Information or Advice Only Call    Care Advice:  Care Advice given per Medication Question Call (Adult) guideline.  Home Care - Information or Advice Only Call.  Call Back If:  * You have more questions  Note to Triager - Answer the Question:  * Answer the caller's question from information in triage guidelines, your clinical experience and knowledge, or a relevant reference / resource.  * Document your response.

## 2021-01-24 ENCOUNTER — Telehealth (INDEPENDENT_AMBULATORY_CARE_PROVIDER_SITE_OTHER): Payer: Medicare PPO | Admitting: Neurological Surgery

## 2021-02-09 ENCOUNTER — Telehealth (INDEPENDENT_AMBULATORY_CARE_PROVIDER_SITE_OTHER): Payer: Medicare PPO | Admitting: Neurological Surgery

## 2021-02-09 DIAGNOSIS — S39012A Strain of muscle, fascia and tendon of lower back, initial encounter: Secondary | ICD-10-CM

## 2021-02-09 DIAGNOSIS — M5136 Other intervertebral disc degeneration, lumbar region: Secondary | ICD-10-CM

## 2021-02-09 NOTE — Progress Notes (Signed)
I, Averee Harb, MD, interviewed and examined the patient while overseeing the documentation performed by my Medical Scribe, Scott Stedman. I have reviewed and revised as necessary the scribe's note and agree with the documented findings and plan of care. Please refer to the scribe's note above for further detailed information regarding the patient encounter and exam.

## 2021-02-09 NOTE — Progress Notes (Signed)
[facility] Neurosurgery  Provider: Dulcy Fanny, MD  Visit Date: 02/09/2021    This visit is being conducted over the telephone at the patients request: Yes  Patient gives verbal consent to proceed and knows there may be a copay/deductible: Yes    Time spent with patient/guardian on this telephone visit: 25 minutes    Given the importance of social distancing and other strategies recommended to reduce the risk of COVID-19 transmission, I am providing medical care to this patient via a telephone visit in place of an in person visit at the request of the patient.    HPI:  Aaron Huang is a 87 year old male who presents with a chief complaint of right-sided low back pain. He was visiting from [place] a few days ago and experienced a flare of acute right low back pain, and went to the ED at [facility]. He was given IV dilaudid in the ED for pain control and discharged home with a prednisone prescription, which he states improved his pain within around 2 days. He has been able to move around and has been taking ibuprofen as needed at home though has weaned off the prednisone.     The patient was referred by: Dr. Hunt Oris.      CURRENT MEDICATIONS:  Current Outpatient Medications   Medication Sig Dispense Refill    HYDROcodone-acetaminophen 5-325 MG tablet Take 1 tablet by mouth every 4 hours as needed for severe pain. 12 tablet 0    ibuprofen 400 MG tablet Take 1 tablet (400 mg) by mouth every 6 hours as needed (pain). 15 tablet 0    methocarbamol 750 MG tablet Take 1 tablet (750 mg) by mouth every 6 hours as needed for muscle spasms. 15 tablet 0    predniSONE 20 MG tablet Take 2 tablets (40 mg) by mouth daily. Take with food. 10 tablet 0     No current facility-administered medications for this visit.       ALLERGIES:  Review of patient's allergies indicates:  No Known Allergies    PAST SURGICAL HISTORY:  No past surgical history on file.    FAMILY HISTORY:      SOCIAL HISTORY:        IMPRESSION / ASSESSMENT:    ICD-10-CM    1. Degenerative disc disease, lumbar  M51.36 Referral to Neurosurgery   2. Lumbar strain, initial encounter  S39.012A Referral to Neurosurgery       [name] is an 87 year old male with a history of acute right low back pain with improvement since the initial flare several days ago with OTC analgesics and oral steroids. His MRI demonstrates moderate degenerative changes at multiple levels worst at L4-L5 where severe facet arthrosis and mild degenerative disc disease combine to result in severe right neural foraminal narrowing which affects the exiting right L4 nerve root.    I discussed the situation with Leonette Most. We reviewed the available treatment options. I explained that as long as he's feeling improved for the time being there's no urgent need to rush into surgery as I do not see any findings in his lumbar spine which would necessitate urgent surgical intervention. We agreed that a thorough course of conservative management is warranted, and with this in mind, he'll pursue evaluation through physiatry closer to home and can contact his PCP for specific referrals, as he lives in [place]. I'm optimistic he won't require any surgical intervention at this time. I did not make a routine follow-up  appointment with Leonette Most, but he will call my office should symptoms worsen. He knows to call sooner for any questions or concerning issues.      Prior EMR records reviewed as available and clinically relevant. All questions answered. Discharge and follow up instructions were discussed with the patient. The patient fully understands and is in agreement with the plan.    Portions of this chart were written by Dale Durham, Medical Scribe with oversight by Dulcy Fanny, MD.    Portions of this chart were made using voice recognition software.

## 2021-03-08 ENCOUNTER — Telehealth: Payer: Self-pay | Admitting: Cardiovascular Disease

## 2021-03-08 NOTE — Telephone Encounter (Signed)
noted 

## 2021-03-08 NOTE — Telephone Encounter (Signed)
Patient called stating he lost his mediation for XARELTO 15 MG TABS tablet in his travels. He insurance won't cover him to get additional medication.  He is wondering if there is something else he can be given for the next 60 days.  He can't afford to pay retail for the xarelto.

## 2021-03-08 NOTE — Telephone Encounter (Signed)
   Pt is calling back, he said he found his xarelto and dont need the samples anymore

## 2021-03-08 NOTE — Telephone Encounter (Signed)
Returned call to patient who states he traveled to [place] and [place] by RV and filled his 90 day Rx for Xarelto at a pharmacy in [place]. He has finished the first bottle of #30 and cannot locate the other 2 bottles. I advised him to call the pharmacy in [place] to ensure that he did pick up 90 day supply and did not leave any behind. I advised that we have placed a 30 day supply at the front desk for him to pick up. He asks about switching to warfarin and I advised him of the INR monitoring that is necessary when taking warfarin. He lives in [place]. [place] and so our office is not very convenient for him. He states he will call his PCP located in [place]. [place] to discuss options regarding Xarelto samples and/or switching to warfarin. I asked him to call back to let us know his plan. He thanked me for the call.

## 2021-03-23 ENCOUNTER — Ambulatory Visit: Payer: Medicare PPO | Admitting: Neurology

## 2021-03-26 ENCOUNTER — Other Ambulatory Visit: Payer: Self-pay

## 2021-03-26 MED ORDER — DILTIAZEM HCL ER COATED BEADS 120 MG PO CP24
120.0000 mg | ORAL_CAPSULE | Freq: Every day | ORAL | 0 refills | Status: DC
Start: 2021-03-26 — End: 2021-06-22

## 2021-04-02 ENCOUNTER — Ambulatory Visit: Payer: Medicare PPO | Admitting: Physician Assistant

## 2021-04-17 ENCOUNTER — Ambulatory Visit: Payer: Medicare PPO | Admitting: Cardiovascular Disease

## 2021-04-17 ENCOUNTER — Encounter: Payer: Self-pay | Admitting: Cardiovascular Disease

## 2021-04-17 ENCOUNTER — Other Ambulatory Visit: Payer: Self-pay

## 2021-04-17 VITALS — BP 112/74 | HR 82 | Ht 71.0 in | Wt 172.8 lb

## 2021-04-17 DIAGNOSIS — I480 Paroxysmal atrial fibrillation: Secondary | ICD-10-CM

## 2021-04-17 DIAGNOSIS — E7800 Pure hypercholesterolemia, unspecified: Secondary | ICD-10-CM | POA: Diagnosis not present

## 2021-04-17 DIAGNOSIS — I351 Nonrheumatic aortic (valve) insufficiency: Secondary | ICD-10-CM

## 2021-04-17 NOTE — Patient Instructions (Signed)
Medication Instructions:  No changes *If you need a refill on your cardiac medications before your next appointment, please call your pharmacy*   Lab Work: none   Testing/Procedures:  due same day as next appointment with Dr. Clifton James December 2023.  Your physician has requested that you have an echocardiogram. Echocardiography is a painless test that uses sound waves to create images of your heart. It provides your doctor with information about the size and shape of your heart and how well your hearts chambers and valves are working. This procedure takes approximately one hour. There are no restrictions for this procedure.   Follow-Up: At [facility], you and your health needs are our priority.  As part of our continuing mission to provide you with exceptional heart care, we have created designated Provider Care Teams.  These Care Teams include your primary Cardiologist (physician) and Advanced Practice Providers (APPs -  Physician Assistants and Nurse Practitioners) who all work together to provide you with the care you need, when you need it.   Your next appointment:   12 month(s)  The format for your next appointment:   In Person  Provider:   Verne Carrow, MD    Other Instructions

## 2021-04-17 NOTE — Progress Notes (Signed)
Chief Complaint  Patient presents with   Follow-up    Atrial fib    History of Present Illness: 87 yo male with history of mild aortic valve insufficiency, paroxysmal atrial fibrillation, prior PE, hyperlipidemia, vasovagal syncope, thoracic aortic aneurysm, RBBB and chronic kidney disease who is here today for cardiac follow up. He has a history of unprovoked pulmonary emboli diagnosed in July of 2008 and was on coumadin until this was stopped in August 2015 by Hematology. He is not known to have CAD. Nuclear stress test without ischemia December 2017. Echo January 2018 with normal LV systolic function, mild AI, trivial MR. He has passed out easily his entire life and passes out after any procedure. 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 He was seen by Dr. Everlena Cooper with Neurology and also evaluated by Neurosurgery. No further workup planned. Dizziness felt to be due to vertigo. He was diagnosed with atrial fibrillation 07/11/16 at [facility] following episode of neck pain and dizziness. He converted to sinus in their ED. He was started on Cardizem and Xarelto. He was found to have brief runs of atrial fib on cardiac monitor April 2018 so he was continued on Xarelto. Echo November 2020 with LVEF=60-65%, trivial MR and AI.   He is here today for follow up. The patient denies any chest pain, dyspnea, palpitations, lower extremity edema, orthopnea, PND, dizziness, near syncope or syncope.   Primary Care Physician: Patient, No Pcp Per (Inactive) (New Dr. Verna Czech)  Past Medical History:  Diagnosis Date   BPH (benign prostatic hyperplasia)    CKD (chronic kidney disease), stage III (HCC)    DVT (deep venous thrombosis) (HCC)    GERD (gastroesophageal reflux disease)    Hernia    Hyperlipidemia    Lung nodule    Mild aortic insufficiency    Mild dilation of  ascending aorta (HCC)    PAF (paroxysmal atrial fibrillation) (HCC)    Pulmonary embolism (HCC)    RBBB    Syncope     Past Surgical History:  Procedure Laterality Date   EYE SURGERY  1957   INGUINAL HERNIA REPAIR     x 3   Left leg trauma     LIPOMA RESECTION      Current Outpatient Medications  Medication Sig Dispense Refill   alfuzosin (UROXATRAL) 10 MG 24 hr tablet Take 10 mg by mouth daily with breakfast.     Ascorbic Acid (VITAMIN C) 1000 MG tablet Take 500 mg by mouth daily.     Cholecalciferol (VITAMIN D3) 2000 units capsule Take 2,000 Units by mouth daily.     colesevelam (WELCHOL) 625 MG tablet Take 625 mg by mouth 2 (two) times daily with a meal.      Cyanocobalamin (VITAMIN B12) 1000 MCG TBCR Take 1 tablet by mouth daily.     diltiazem (CARDIZEM CD) 120 MG 24 hr capsule Take 1 capsule (120 mg total) by mouth daily. 90 capsule 0   esomeprazole (NEXIUM) 40 MG capsule Take 40 mg by mouth daily.       ezetimibe (ZETIA) 10 MG tablet Take 1 tablet (10 mg total) by mouth daily. 30 tablet 11   lubiprostone (AMITIZA) 8 MCG capsule Take 8 mcg by mouth daily.      XARELTO 15 MG TABS tablet TAKE 1 TABLET BY MOUTH DAILY WITH SUPPER 90  tablet 1   No current facility-administered medications for this visit.    Allergies  Allergen Reactions   Other Rash   Tape Rash   Anti-Oxidant     REACTION: intol gi upset   Centrum     GI upset   Latex Rash    Social History   Socioeconomic History   Marital status: Married    Spouse name: Not on file   Number of children: 2   Years of education: Not on file   Highest education level: Not on file  Occupational History   Occupation: Armed forces operational officer for [employer]  Tobacco Use   Smoking status: Never   Smokeless tobacco: Never  Vaping Use   Vaping Use: Never used  Substance and Sexual Activity   Alcohol use: No   Drug use: No   Sexual activity: Not on file  Other Topics Concern   Not on file  Social History  Narrative   Right Handed    Lives in a one story home   Drinks Caffeine    Social Determinants of Health   Financial Resource Strain: Not on file  Food Insecurity: Not on file  Transportation Needs: Not on file  Physical Activity: Not on file  Stress: Not on file  Social Connections: Not on file  Intimate Partner Violence: Not on file    Family History  Problem Relation Age of Onset   Heart failure Father    Heart attack Father        x2   Cancer Mother        of eye-causing her to lose her eye    Review of Systems:  As stated in the HPI and otherwise negative.   BP 112/74   Pulse 82   Ht 5\' 11"  (1.803 m)   Wt 172 lb 12.8 oz (78.4 kg)   SpO2 96%   BMI 24.10 kg/m   Physical Examination:   General: Well developed, well nourished, NAD  HEENT: OP clear, mucus membranes moist  SKIN: warm, dry. No rashes. Neuro: No focal deficits  Musculoskeletal: Muscle strength 5/5 all ext  Psychiatric: Mood and affect normal  Neck: No JVD, no carotid bruits, no thyromegaly, no lymphadenopathy.  Lungs:Clear bilaterally, no wheezes, rhonci, crackles Cardiovascular: Regular rate and rhythm. No murmurs, gallops or rubs. Abdomen:Soft. Bowel sounds present. Non-tender.  Extremities: No lower extremity edema. Pulses are 2 + in the bilateral DP/PT.  Echo November 2020: . 1. Left ventricular ejection fraction, by visual estimation, is 60 to  65%. The left ventricle has normal function. There is mildly increased  left ventricular hypertrophy.   2. Left ventricular diastolic parameters are consistent with Grade I  diastolic dysfunction (impaired relaxation).   3. Global right ventricle has normal systolic function.The right  ventricular size is normal. No increase in right ventricular wall  thickness.   4. Left atrial size was normal.   5. Right atrial size was normal.   6. The mitral valve is normal in structure. Trace mitral valve  regurgitation.   7. The tricuspid valve is normal in  structure. Tricuspid valve  regurgitation is not demonstrated.   8. The aortic valve is tricuspid. Aortic valve regurgitation is trivial.  Mild aortic valve sclerosis without stenosis.   9. The pulmonic valve was not well visualized. Pulmonic valve  regurgitation is not visualized.  10. Mildly elevated pulmonary artery systolic pressure.   Nuclear stress test December 2017: Nuclear stress EF: 55%. Blood pressure demonstrated a normal response  to exercise. There was no ST segment deviation noted during stress. The study is normal. This is a low risk study. The left ventricular ejection fraction is normal (55-65%). The patient was asymptomatic during the stress test.   Normal exercise nuclear stress test with no evidence of prior infarct or ischemia.  Good exercise capacity. Normal BP response to stress.   EKG:  EKG is ordered today. The EKG demonstrates sinus, RBBB  Recent Labs: No results found for requested labs within last 8760 hours.    Wt Readings from Last 3 Encounters:  04/17/21 172 lb 12.8 oz (78.4 kg)  03/23/20 174 lb 9.6 oz (79.2 kg)  03/20/20 175 lb (79.4 kg)     Other studies Reviewed: Additional studies/ records that were reviewed today include: . Review of the above records demonstrates:   Assessment and Plan:   1.  Aortic regurgitation: Trivial by echo in 2020. Repeat echo in November 2023.   2. Atrial fibrillation, paroxysmal: Sinus today. Will continue Cardizem and Xarelto.    3. Hyperlipidemia: Followed by primary care. Lipids controlled per pt. Will continue Welchol and Zetia.   4. Thoracic aortic aneurysm: Mild by echo in November 2018. Does not appear enlarged by echo in 2020.   Current medicines are reviewed at length with the patient today.  The patient does not have concerns regarding medicines.  The following changes have been made:  no change  Labs/ tests ordered today include:   Orders Placed This Encounter  Procedures   EKG 12-Lead       Disposition:   F/U with me in 12 months   Signed, Lauree Chandler, MD 04/17/2021 2:34 PM    [address], [place], [place]  [address] Phone: [phone]; Fax: [phone]

## 2021-04-25 ENCOUNTER — Telehealth: Payer: Self-pay | Admitting: Cardiovascular Disease

## 2021-04-25 MED ORDER — RIVAROXABAN 15 MG PO TABS: 15.0000 mg | ORAL_TABLET | Freq: Every day | ORAL | 1 refills | Status: DC

## 2021-04-25 NOTE — Telephone Encounter (Signed)
°*  STAT* If patient is at the pharmacy, call can be transferred to refill team.   1. Which medications need to be refilled? (please list name of each medication and dose if known) XARELTO 15 MG TABS tablet  2. Which pharmacy/location (including street and city if local pharmacy) is medication to be sent to?[id number] [place], [place] - [address]  3. Do they need a 30 day or 90 day supply? 90 ds

## 2021-04-25 NOTE — Telephone Encounter (Signed)
Prescription refill request for Xarelto received.  Indication: pe, afib  Last office visit: [place], 04/17/2021 Weight: 78.4 kg  Age: 87 yo  Scr: 1.61, 05/18/2020 CrCl: 36 ml/min   Refill sent.

## 2021-05-15 ENCOUNTER — Ambulatory Visit: Payer: Medicare PPO | Admitting: Cardiovascular Disease

## 2021-06-22 ENCOUNTER — Other Ambulatory Visit: Payer: Self-pay | Admitting: *Deleted

## 2021-06-22 MED ORDER — DILTIAZEM HCL ER COATED BEADS 120 MG PO CP24: 120.0000 mg | ORAL_CAPSULE | Freq: Every day | ORAL | 3 refills | Status: DC

## 2021-08-16 ENCOUNTER — Telehealth: Payer: Self-pay | Admitting: Cardiovascular Disease

## 2021-08-16 NOTE — Telephone Encounter (Signed)
Patient states yesterday he was diagnosed with COVID and he would like to go over his medications to make sure he is taking everything correctly. Please assist. ?

## 2021-08-16 NOTE — Telephone Encounter (Signed)
Pt wanted to report that he has Covid and is taking azithromycin tablet and prednisone.  They are listed in Care Everywhere.   He is feeling alright.  He had low grade fever, chills and generally feeling unwell.   ? ?He wondered why he wasn't prescribed Paxlovid.  I adv it has many cardiac interactions and that may be why.   ?I will forward to PharmD to confirm no interaction of antibiotic and prednisone w his current meds and will call him back if any concerns with them. ? ?He understands to rest but move around, hydrate and avoid medications with decongestants. ?

## 2021-08-17 NOTE — Telephone Encounter (Signed)
I let the patient know in my chart. ?

## 2021-08-17 NOTE — Telephone Encounter (Signed)
No interactions with azithromycin and prednisone. He was not prescribed Paxlovid due to interaction with his Xarelto. ?

## 2021-10-13 NOTE — Progress Notes (Signed)
Patient ID: [name], male    DOB: 03-07-1933, 88 y.o.   MRN: [id number]  HPI 72 yoM never smoker with past hx of DVT/ PE( 2013, Lupus anticoagulant) and bronchitis, complicated by hx of lung nodule, GERD, vagalinstability, PAFib,  FENO- 03/13/16- 25- borderline for allergic asthma , possibly normal. ----------------------------------------------------------------------------------------------------  03/23/20- 88 year old male never smoker with past history DVT/PE( 2013, +Lupus aticoagulant), Bronchitis, complicated by history lung nodule, GERD, vagal instability, CKD 3, PA.Fib, Gr1DD,  Covide vax- 3 Moderna Flu vax- today senior pt recently saw neurlogist about neuropathy;  stated no changes on overall health.  No acute respiratory events. Discussed Covid vaccination. His PCP died, pending first visit with new one soon. I suggested he discuss Xarelto length of treatment with new MD and cardiology. Remote hx PE with lupus anticoagulant and hx paroxysmal AFib. No bleeding. Recently drove to [facility] and back with no problems. He still wants to maintain contact here.  10/16/21- 88 year old male never smoker with past history DVT/PE/Xarelto( 2013, +Lupus aticoagulant), Bronchitis, complicated by history lung nodule, GERD, vagal instability, CKD 3, PAFib, Gr1DD, Degenerative Disc Disease,  Covide vax- 4 Moderna Flu vax-  -----Patient feels like he is doing good, no concerns Follows here after remote PE and chooses to continue. Now on Xarelto for PAfib. Occasional bronchitis.  Does long drives in his RV.   ROS-see HPI  + = positive Constitutional:   No-   weight loss, night sweats, fevers, chills, fatigue, lassitude. No bleeding HEENT:   No-  headaches, difficulty swallowing, tooth/dental problems, sore throat,       No-  sneezing, itching, ear ache, + nasal congestion, post nasal drip,  CV:  No-   chest pain, orthopnea, PND, swelling in lower extremities, anasarca, dizziness,  palpitations Resp: No-   shortness of breath with exertion or at rest.              No-   productive cough,  No non-productive cough,  No- coughing up of blood.              No-   change in color of mucus.  No- wheezing.   Skin: No-   rash or lesions. GI:  No-   heartburn, indigestion, abdominal pain, nausea, vomiting,  GU: MS:  No-   joint pain or swelling.  Neuro-     ? Vagal instability- eval by his other physicians. Psych:  No- change in mood or affect. No depression or anxiety.  No memory loss.  OBJ- Physical Exam   BP soft but he is comfortable General- Alert, Oriented, Affect-appropriate, Distress- none acute. Looks well Skin- rash-none, lesions- none, excoriation- none Lymphadenopathy- none Head- atraumatic            Eyes- Gross vision intact, PERRLA, conjunctivae and secretions clear            Ears- Hearing, canals-normal            Nose- Clear, +Septal dev and dev external nose,  No-mucus, polyps, erosion, perforation             Throat- Mallampati II , mucosa clear , drainage- none, tonsils- atrophic Neck- flexible , trachea midline, no stridor , thyroid nl, carotid no bruit Chest - symmetrical excursion , unlabored           Heart/CV- RRR , no murmur , no gallop  , no rub, nl s1 s2                           -  JVD- none , edema- none, stasis changes- none, varices- none           Lung- clear,  wheeze-none, cough- none , dullness-none, rub- none           Chest wall-  Abd-  Br/ Gen/ Rectal- Not done, not indicated Extrem- cyanosis- none, clubbing, none, atrophy- none, strength- nl Neuro- grossly intact to observation

## 2021-10-16 ENCOUNTER — Ambulatory Visit (INDEPENDENT_AMBULATORY_CARE_PROVIDER_SITE_OTHER): Payer: Medicare PPO

## 2021-10-16 ENCOUNTER — Ambulatory Visit: Payer: Medicare PPO | Admitting: Internal Medicine

## 2021-10-16 ENCOUNTER — Encounter: Payer: Self-pay | Admitting: Internal Medicine

## 2021-10-16 VITALS — BP 100/62 | HR 70 | Temp 98.2°F | Ht 70.0 in | Wt 170.8 lb

## 2021-10-16 DIAGNOSIS — R911 Solitary pulmonary nodule: Secondary | ICD-10-CM | POA: Diagnosis not present

## 2021-10-16 DIAGNOSIS — Z7901 Long term (current) use of anticoagulants: Secondary | ICD-10-CM | POA: Diagnosis not present

## 2021-10-16 DIAGNOSIS — I480 Paroxysmal atrial fibrillation: Secondary | ICD-10-CM | POA: Diagnosis not present

## 2021-10-16 DIAGNOSIS — I2699 Other pulmonary embolism without acute cor pulmonale: Secondary | ICD-10-CM | POA: Diagnosis not present

## 2021-10-16 NOTE — Patient Instructions (Addendum)
Order CXR    dx paroxysmal atrial fibrillation  No changes to suggest- glad you have been doing well

## 2021-10-20 ENCOUNTER — Other Ambulatory Visit: Payer: Self-pay | Admitting: Cardiovascular Disease

## 2021-10-22 NOTE — Telephone Encounter (Signed)
Prescription refill request for Xarelto received.  Indication:Afib Last office visit:12/22 Weight:77.5 kg Age:88 Scr:1.6 CrCl:36.33 ml/min  Prescription refilled

## 2021-11-13 NOTE — Plan of Care (Signed)
88 yo w pAF  on xarelto (last dose 07.11 am) presented w weakness and fatigue. Found to have 2:1 AVB  HDS. Last bp 140/60 Plan Npo  Tte Cbc, cmp, PPT Hold AC Cxr (pa, lateral) Keep k4,mg2 Tentative PPM in am

## 2021-11-14 ENCOUNTER — Observation Stay (HOSPITAL_COMMUNITY): Payer: Medicare PPO

## 2021-11-14 ENCOUNTER — Encounter (HOSPITAL_COMMUNITY): Payer: Self-pay | Admitting: Cardiology

## 2021-11-14 ENCOUNTER — Other Ambulatory Visit: Payer: Self-pay

## 2021-11-14 ENCOUNTER — Inpatient Hospital Stay (HOSPITAL_COMMUNITY)
Admission: EM | Admit: 2021-11-14 | Discharge: 2021-11-16 | DRG: 243 | Disposition: A | Discharge status: Home / Self Care | Payer: Medicare PPO | Source: Other Acute Inpatient Hospital | Attending: Cardiology | Admitting: Cardiology

## 2021-11-14 ENCOUNTER — Telehealth: Payer: Self-pay | Admitting: Cardiovascular Disease

## 2021-11-14 ENCOUNTER — Observation Stay (HOSPITAL_BASED_OUTPATIENT_CLINIC_OR_DEPARTMENT_OTHER): Payer: Medicare PPO

## 2021-11-14 DIAGNOSIS — K219 Gastro-esophageal reflux disease without esophagitis: Secondary | ICD-10-CM | POA: Diagnosis present

## 2021-11-14 DIAGNOSIS — N183 Chronic kidney disease, stage 3 unspecified: Secondary | ICD-10-CM | POA: Diagnosis present

## 2021-11-14 DIAGNOSIS — Z7901 Long term (current) use of anticoagulants: Secondary | ICD-10-CM | POA: Diagnosis not present

## 2021-11-14 DIAGNOSIS — I4510 Unspecified right bundle-branch block: Secondary | ICD-10-CM | POA: Diagnosis present

## 2021-11-14 DIAGNOSIS — I712 Thoracic aortic aneurysm, without rupture, unspecified: Secondary | ICD-10-CM | POA: Diagnosis present

## 2021-11-14 DIAGNOSIS — Z86718 Personal history of other venous thrombosis and embolism: Secondary | ICD-10-CM

## 2021-11-14 DIAGNOSIS — I441 Atrioventricular block, second degree: Principal | ICD-10-CM | POA: Diagnosis present

## 2021-11-14 DIAGNOSIS — Z79899 Other long term (current) drug therapy: Secondary | ICD-10-CM

## 2021-11-14 DIAGNOSIS — Z8249 Family history of ischemic heart disease and other diseases of the circulatory system: Secondary | ICD-10-CM

## 2021-11-14 DIAGNOSIS — N400 Enlarged prostate without lower urinary tract symptoms: Secondary | ICD-10-CM | POA: Diagnosis present

## 2021-11-14 DIAGNOSIS — Z86711 Personal history of pulmonary embolism: Secondary | ICD-10-CM

## 2021-11-14 DIAGNOSIS — Z91040 Latex allergy status: Secondary | ICD-10-CM

## 2021-11-14 DIAGNOSIS — I480 Paroxysmal atrial fibrillation: Secondary | ICD-10-CM | POA: Diagnosis present

## 2021-11-14 DIAGNOSIS — D6862 Lupus anticoagulant syndrome: Secondary | ICD-10-CM | POA: Diagnosis present

## 2021-11-14 DIAGNOSIS — E785 Hyperlipidemia, unspecified: Secondary | ICD-10-CM | POA: Diagnosis present

## 2021-11-14 DIAGNOSIS — I351 Nonrheumatic aortic (valve) insufficiency: Secondary | ICD-10-CM | POA: Diagnosis present

## 2021-11-14 DIAGNOSIS — R55 Syncope and collapse: Secondary | ICD-10-CM | POA: Diagnosis present

## 2021-11-14 DIAGNOSIS — Z809 Family history of malignant neoplasm, unspecified: Secondary | ICD-10-CM | POA: Diagnosis not present

## 2021-11-14 DIAGNOSIS — Z888 Allergy status to other drugs, medicaments and biological substances status: Secondary | ICD-10-CM

## 2021-11-14 DIAGNOSIS — I459 Conduction disorder, unspecified: Principal | ICD-10-CM | POA: Diagnosis present

## 2021-11-14 LAB — HEPATIC FUNCTION PANEL
ALT: 19 U/L (ref 0–44)
AST: 21 U/L (ref 15–41)
Albumin: 3.4 g/dL — ABNORMAL LOW (ref 3.5–5.0)
Alkaline Phosphatase: 46 U/L (ref 38–126)
Bilirubin, Direct: <0.1 mg/dL (ref 0.0–0.2)
Total Bilirubin: 0.8 mg/dL (ref 0.3–1.2)
Total Protein: 6.3 g/dL — ABNORMAL LOW (ref 6.5–8.1)

## 2021-11-14 LAB — BASIC METABOLIC PANEL
Anion gap: 8 (ref 5–15)
BUN: 19 mg/dL (ref 8–23)
CO2: 23 mmol/L (ref 22–32)
Calcium: 8.8 mg/dL — ABNORMAL LOW (ref 8.9–10.3)
Chloride: 111 mmol/L (ref 98–111)
Creatinine, Ser: 1.56 mg/dL — ABNORMAL HIGH (ref 0.61–1.24)
GFR, Estimated: 43 mL/min — ABNORMAL LOW (ref >60)
Glucose, Bld: 98 mg/dL (ref 70–99)
Potassium: 4.4 mmol/L (ref 3.5–5.1)
Sodium: 142 mmol/L (ref 135–145)

## 2021-11-14 LAB — ECHOCARDIOGRAM COMPLETE
AR max vel: 3.48 cm2
AV Peak grad: 7.7 mmHg
Ao pk vel: 1.39 m/s
Area-P 1/2: 3.45 cm2
Calc EF: 58.8 %
Height: 69 in
MV M vel: 4.24 m/s
MV Peak grad: 71.9 mmHg
P 1/2 time: 1572 msec
S' Lateral: 2.7 cm
Single Plane A2C EF: 58.9 %
Single Plane A4C EF: 57 %
Weight: 2772.5 oz

## 2021-11-14 LAB — CBC WITH DIFFERENTIAL/PLATELET
Abs Immature Granulocytes: 0.01 10*3/uL (ref 0.00–0.07)
Basophils Absolute: 0.1 10*3/uL (ref 0.0–0.1)
Basophils Relative: 1 %
Eosinophils Absolute: 0.1 10*3/uL (ref 0.0–0.5)
Eosinophils Relative: 2 %
HCT: 38.9 % — ABNORMAL LOW (ref 39.0–52.0)
Hemoglobin: 13.5 g/dL (ref 13.0–17.0)
Immature Granulocytes: 0 %
Lymphocytes Relative: 46 %
Lymphs Abs: 3.2 10*3/uL (ref 0.7–4.0)
MCH: 33.4 pg (ref 26.0–34.0)
MCHC: 34.7 g/dL (ref 30.0–36.0)
MCV: 96.3 fL (ref 80.0–100.0)
Monocytes Absolute: 0.8 10*3/uL (ref 0.1–1.0)
Monocytes Relative: 11 %
Neutro Abs: 2.8 10*3/uL (ref 1.7–7.7)
Neutrophils Relative %: 40 %
Platelets: 181 10*3/uL (ref 150–400)
RBC: 4.04 MIL/uL — ABNORMAL LOW (ref 4.22–5.81)
RDW: 13.9 % (ref 11.5–15.5)
WBC: 7 10*3/uL (ref 4.0–10.5)
nRBC: 0 % (ref 0.0–0.2)

## 2021-11-14 LAB — MAGNESIUM: Magnesium: 2 mg/dL (ref 1.7–2.4)

## 2021-11-14 LAB — GLUCOSE, CAPILLARY: Glucose-Capillary: 99 mg/dL (ref 70–99)

## 2021-11-14 LAB — TSH: TSH: 3.757 u[IU]/mL (ref 0.350–4.500)

## 2021-11-14 MED ORDER — SODIUM CHLORIDE 0.9% FLUSH
3.0000 mL | Freq: Two times a day (BID) | INTRAVENOUS | Status: DC
Start: 2021-11-14 — End: 2021-11-16
  Administered 2021-11-14 – 2021-11-15 (×3): 3 mL via INTRAVENOUS

## 2021-11-14 NOTE — Telephone Encounter (Signed)
Called pt in regards to inpatient status.  Pt reports was transferred from [facility] to [facility] for consideration of PPM.  Pt would like to ensure Dr. Clifton James is apart of the decision making team.  Advised that our EP providers direct this care.  Pt reports wants to make sure anesthesiology knows that pt has trouble after taking certain medications.  Advised pt to relay this information to Providers that come in to speak with him in regards to procedure. Will route pt message to MD and nurse.

## 2021-11-14 NOTE — H&P (Signed)
Cardiology Admission History and Physical:   Patient ID: KOLESON ATA MRN: [id number]; DOB: 06-Apr-1933   Admission date: 11/14/2021  PCP:  Patient, No Pcp Per   [facility] Prov[facility] Cardiologist:  Lauree Chandler, MD        Chief Complaint: Second-degree heart block  Patient Profile:   [name] is a 88 y.o. male with mild aortic valve insufficiency, paroxysmal atrial fibrillation CHA2DS2-VASc of 3, has bled of 1 pulm chronic Xarelto, hyperlipidemia, vasovagal syncope, thoracic aortic aneurysm, RBBB and chronic kidney disease III, DVT/ PE w/ Lupus anticoagulant- who is being seen 11/14/2021 for the evaluation of symptomatic second-degree heart block.  History of Present Illness:   [name] initially presented to [facility] with worsening fatigue and weakness.  Patient reports symptom onset about 2 days ago.  On presentation patient was found to be in 2-1 heart block with evidence of right bundle branch block.  Not responsive to atropine.  Otherwise patient mains medically stable.  Prior to presentation patient reports progressive fatigue and malaise.  Patient reports compliance with his Xarelto last dose was on 11/12/2021.The patient denies any chest pain, dyspnea, palpitations, lower extremity edema, orthopnea, PND,near syncope or syncope.    Past Medical History:  Diagnosis Date   BPH (benign prostatic hyperplasia)    CKD (chronic kidney disease), stage III (HCC)    DVT (deep venous thrombosis) (HCC)    GERD (gastroesophageal reflux disease)    Hernia    Hyperlipidemia    Lung nodule    Mild aortic insufficiency    Mild dilation of ascending aorta (HCC)    PAF (paroxysmal atrial fibrillation) (HCC)    Pulmonary embolism (HCC)    RBBB    Syncope     Past Surgical History:  Procedure Laterality Date   EYE SURGERY  1957   INGUINAL HERNIA REPAIR     x 3   Left leg trauma     LIPOMA RESECTION       Medications Prior to Admission: Prior to  Admission medications   Medication Sig Start Date End Date Taking? Authorizing Provider  alfuzosin (UROXATRAL) 10 MG 24 hr tablet Take 10 mg by mouth daily with breakfast.    [provider]  Ascorbic Acid (VITAMIN C) 1000 MG tablet Take 500 mg by mouth daily.    [provider]  Cholecalciferol (VITAMIN D3) 2000 units capsule Take 2,000 Units by mouth daily.    [provider]  Cyanocobalamin (VITAMIN B12) 1000 MCG TBCR Take 1 tablet by mouth daily.    [provider]  diltiazem (CARDIZEM CD) 120 MG 24 hr capsule Take 1 capsule (120 mg total) by mouth daily. 06/22/21   Burnell Blanks, MD  esomeprazole (NEXIUM) 40 MG capsule Take 40 mg by mouth daily.      [provider]  ezetimibe (ZETIA) 10 MG tablet Take 1 tablet (10 mg total) by mouth daily. 01/07/12   Burnell Blanks, MD  lubiprostone (AMITIZA) 8 MCG capsule Take 8 mcg by mouth daily.     [provider]  XARELTO 15 MG TABS tablet TAKE 1 TABLET(15 MG) BY MOUTH DAILY WITH SUPPER 10/22/21   Burnell Blanks, MD     Allergies:    Allergies  Allergen Reactions   Other Rash   Tape Rash   Anti-Oxidant     REACTION: intol gi upset   Centrum     GI upset   Latex Rash    Social History:  Social History   Socioeconomic History   Marital status: Married    Spouse name: Not on file   Number of children: 2   Years of education: Not on file   Highest education level: Not on file  Occupational History   Occupation: Horticulturist, commercial for [employer], educational  Tobacco Use   Smoking status: Never   Smokeless tobacco: Never  Vaping Use   Vaping Use: Never used  Substance and Sexual Activity   Alcohol use: No   Drug use: No   Sexual activity: Not on file  Other Topics Concern   Not on file  Social History Narrative   Right Handed    Lives in a one story home   Drinks Caffeine    Social Determinants of Health   Financial Resource Strain: Not on file   Food Insecurity: Not on file  Transportation Needs: Not on file  Physical Activity: Not on file  Stress: Not on file  Social Connections: Not on file  Intimate Partner Violence: Not on file    Family History:   The patient's family history includes Cancer in his mother; Heart attack in his father; Heart failure in his father.    ROS:  Please see the history of present illness.  All other ROS reviewed and negative.     Physical Exam/Data:   Vitals:   11/14/21 0257 11/14/21 0308  BP:  (!) 156/66  Pulse:  (!) 51  Resp:  16  Temp: 97.7 F (36.5 C) 97.7 F (36.5 C)  TempSrc: Oral Oral  SpO2:  96%  Weight: 78.6 kg   Height: 5\' 9"  (1.753 m)    No intake or output data in the 24 hours ending 11/14/21 0357    11/14/2021    2:57 AM 10/16/2021    3:48 PM 04/17/2021    2:05 PM  Last 3 Weights  Weight (lbs) 173 lb 4.5 oz 170 lb 12.8 oz 172 lb 12.8 oz  Weight (kg) 78.6 kg 77.474 kg 78.382 kg     Body mass index is 25.59 kg/m.  General:  Well nourished, well developed, in no acute distress HEENT: normal Neck: no JVD Vascular: No carotid bruits; Distal pulses 2+ bilaterally   Cardiac:  normal S1, S2; RRR; no murmur  Lungs:  clear to auscultation bilaterally, no wheezing, rhonchi or rales  Abd: soft, nontender, no hepatomegaly  Ext: no edema Musculoskeletal:  No deformities, BUE and BLE strength normal and equal Skin: warm and dry  Neuro:  CNs 2-12 intact, no focal abnormalities noted Psych:  Normal affect    EKG:  The ECG that was done 11/13/2021 and 11/14/2021 was personally reviewed and demonstrates sinus rhythm with 2 to 1 AV block as well as right bundle branch block  Relevant CV Studies: Prior TTE 2020 1. Left ventricular ejection fraction, by visual estimation, is 60 to  65%. The left ventricle has normal function. There is mildly increased  left ventricular hypertrophy.   2. Left ventricular diastolic parameters are consistent with Grade I  diastolic dysfunction  (impaired relaxation).   3. Global right ventricle has normal systolic function.The right  ventricular size is normal. No increase in right ventricular wall  thickness.   4. Left atrial size was normal.   5. Right atrial size was normal.   6. The mitral valve is normal in structure. Trace mitral valve  regurgitation.   7. The tricuspid valve is normal in structure. Tricuspid valve  regurgitation is not demonstrated.   8.  The aortic valve is tricuspid. Aortic valve regurgitation is trivial.  Mild aortic valve sclerosis without stenosis.   9. The pulmonic valve was not well visualized. Pulmonic valve  regurgitation is not visualized.  10. Mildly elevated pulmonary artery systolic pressure.   Laboratory Data:  High Sensitivity Troponin:  No results for input(s): "TROPONINIHS" in the last 720 hours.    ChemistryNo results for input(s): "NA", "K", "CL", "CO2", "GLUCOSE", "BUN", "CREATININE", "CALCIUM", "MG", "GFRNONAA", "GFRAA", "ANIONGAP" in the last 168 hours.  No results for input(s): "PROT", "ALBUMIN", "AST", "ALT", "ALKPHOS", "BILITOT" in the last 168 hours. Lipids No results for input(s): "CHOL", "TRIG", "HDL", "LABVLDL", "LDLCALC", "CHOLHDL" in the last 168 hours. HematologyNo results for input(s): "WBC", "RBC", "HGB", "HCT", "MCV", "MCH", "MCHC", "RDW", "PLT" in the last 168 hours. Thyroid No results for input(s): "TSH", "FREET4" in the last 168 hours. BNPNo results for input(s): "BNP", "PROBNP" in the last 168 hours.  DDimer No results for input(s): "DDIMER" in the last 168 hours.   Radiology/Studies:  No results found.   Assessment and Plan:   Symptomatic second-degree AV block.  N.p.o. now.  Will reassess electrolytes.  No apparent reversible causes at this time.  At home a small dose of Cardizem.       Follow-up TSH.  TTE.         Tentative plan for DC PPM consider left bundle area pacing.  Hold anticoagulation for now. Paroxysmal A-fib.  Hold Xarelto resume following  tentative PPM Hyperlipidemia. Follow-up lipid profile. thoracic aorta aneurysm. Stable.  Continue to monitor vasovagal syncope, continue to monitor  chronic kidney disease III, creatinine appears at baseline of 1.5 DVT/ PE w/ Lupus anticoagulant resume Xarelto after PPM insertion Risk Assessment/Risk Scores:         CHA2DS2-VASc Score =     This indicates a  % annual risk of stroke. The patient's score is based upon:        Severity of Illness: The appropriate patient status for this patient is OBSERVATION. Observation status is judged to be reasonable and necessary in order to provide the required intensity of service to ensure the patient's safety. The patient's presenting symptoms, physical exam findings, and initial radiographic and laboratory data in the context of their medical condition is felt to place them at decreased risk for further clinical deterioration. Furthermore, it is anticipated that the patient will be medically stable for discharge from the hospital within 2 midnights of admission.    For questions or updates, please contact [facility] Please consult [email or web address] for contact info under     Signed, Lenon Oms, MD  11/14/2021 3:57 AM

## 2021-11-14 NOTE — Telephone Encounter (Signed)
Patient's daughter called to let us know that her father in the hospital and they are talking about implanting a pace maker. She wanted make sure we were aware.

## 2021-11-14 NOTE — Telephone Encounter (Signed)
Patient called to say he is schedule to have a procedure, and he wants to make sure Dr. Clifton James is part of the conversation.  Patient is in Room 4E24 at [facility]

## 2021-11-14 NOTE — Progress Notes (Signed)
Patient arrived from [facility] via [place]. Pt is resting in bed, call bell in reach.

## 2021-11-14 NOTE — Progress Notes (Signed)
Progress Note  Patient Name: [name] Date of Encounter: 11/14/2021  Primary Cardiologist: Verne Carrow, MD   Subjective   Patient seen and examined at his bedside.   Inpatient Medications    Scheduled Meds:  sodium chloride flush  3 mL Intravenous Q12H   Continuous Infusions:  PRN Meds:    Vital Signs    Vitals:   11/14/21 0401 11/14/21 0404 11/14/21 0406 11/14/21 0818  BP: 121/82 110/72 (!) 159/69 126/80  Pulse: (!) 46 (!) 35 (!) 35 (!) 56  Resp: 16 14  16   Temp: 97.8 F (36.6 C) 97.8 F (36.6 C) 97.8 F (36.6 C) 97.7 F (36.5 C)  TempSrc: Oral  Oral Oral  SpO2: 100%   97%  Weight:      Height:        Intake/Output Summary (Last 24 hours) at 11/14/2021 1046 Last data filed at 11/14/2021 0300 Gross per 24 hour  Intake 0 ml  Output --  Net 0 ml   Filed Weights   11/14/21 0257  Weight: 78.6 kg    Telemetry    Marked sinus bradycardia with intermittent sinus pause, 2:1 AV block earlier  - Personally Reviewed  ECG    Marked sinus bradycardia  - Personally Reviewed  Physical Exam    General: Comfortable Head: Atraumatic, normal size  Eyes: PEERLA, EOMI  Neck: Supple, normal JVD Cardiac: Normal S1, S2; RRR; no murmurs, rubs, or gallops Lungs: Clear to auscultation bilaterally Abd: Soft, nontender, no hepatomegaly  Ext: warm, no edema Musculoskeletal: No deformities, BUE and BLE strength normal and equal Skin: Warm and dry, no rashes   Neuro: Alert and oriented to person, place, time, and situation, CNII-XII grossly intact, no focal deficits  Psych: Normal mood and affect   Labs    Chemistry Recent Labs  Lab 11/14/21 0538  NA 142  K 4.4  CL 111  CO2 23  GLUCOSE 98  BUN 19  CREATININE 1.56*  CALCIUM 8.8*  PROT 6.3*  ALBUMIN 3.4*  AST 21  ALT 19  ALKPHOS 46  BILITOT 0.8  GFRNONAA 43*  ANIONGAP 8     Hematology Recent Labs  Lab 11/14/21 0538  WBC 7.0  RBC 4.04*  HGB 13.5  HCT 38.9*  MCV 96.3  MCH 33.4   MCHC 34.7  RDW 13.9  PLT 181    Cardiac EnzymesNo results for input(s): "TROPONINI" in the last 168 hours. No results for input(s): "TROPIPOC" in the last 168 hours.   BNPNo results for input(s): "BNP", "PROBNP" in the last 168 hours.   DDimer No results for input(s): "DDIMER" in the last 168 hours.   Radiology    X-ray chest PA and lateral  Result Date: 11/14/2021 CLINICAL DATA:  Encounter for hard plaque. EXAM: CHEST - 2 VIEW COMPARISON:  PA Lat 10/16/2021 FINDINGS: The heart size and mediastinal contours are within normal limits apart from aortic tortuosity with calcification in the arch. Both lungs are clear although less well inflated than previously. The visualized skeletal structures are intact with mild lower thoracic dextroscoliosis, mild osteopenia and thoracic spondylosis. A partially translucent electrical pad overlies the posterior lower left chest. There are multiple overlying monitor wires. IMPRESSION: No active cardiopulmonary disease. Aortic atherosclerosis and uncoiling. Osteopenia and degenerative change. Electronically Signed   By: 10/18/2021 M.D.   On: 11/14/2021 06:04    Cardiac Studies   TTE 03/15/2019 IMPRESSIONS     1. Left ventricular ejection fraction, by visual estimation, is 60  to  65%. The left ventricle has normal function. There is mildly increased  left ventricular hypertrophy.   2. Left ventricular diastolic parameters are consistent with Grade I  diastolic dysfunction (impaired relaxation).   3. Global right ventricle has normal systolic function.The right  ventricular size is normal. No increase in right ventricular wall  thickness.   4. Left atrial size was normal.   5. Right atrial size was normal.   6. The mitral valve is normal in structure. Trace mitral valve  regurgitation.   7. The tricuspid valve is normal in structure. Tricuspid valve  regurgitation is not demonstrated.   8. The aortic valve is tricuspid. Aortic valve  regurgitation is trivial.  Mild aortic valve sclerosis without stenosis.   9. The pulmonic valve was not well visualized. Pulmonic valve  regurgitation is not visualized.  10. Mildly elevated pulmonary artery systolic pressure.   FINDINGS   Left Ventricle: Left ventricular ejection fraction, by visual estimation,  is 60 to 65%. The left ventricle has normal function. The left ventricular  internal cavity size was the left ventricle is normal in size. There is  mildly increased left  ventricular hypertrophy. Left ventricular diastolic parameters are  consistent with Grade I diastolic dysfunction (impaired relaxation).   Right Ventricle: The right ventricular size is normal. No increase in  right ventricular wall thickness. Global RV systolic function is has  normal systolic function. The tricuspid regurgitant velocity is 2.31 m/s,  and with an assumed right atrial pressure   of 10 mmHg, the estimated right ventricular systolic pressure is mildly  elevated at 31.3 mmHg.   Left Atrium: Left atrial size was normal in size.   Right Atrium: Right atrial size was normal in size   Pericardium: There is no evidence of pericardial effusion.   Mitral Valve: The mitral valve is normal in structure. Trace mitral valve  regurgitation.   Tricuspid Valve: The tricuspid valve is normal in structure. Tricuspid  valve regurgitation is not demonstrated.   Aortic Valve: The aortic valve is tricuspid. Aortic valve regurgitation is  trivial. Aortic regurgitation PHT measures 640 msec. Mild aortic valve  sclerosis is present, with no evidence of aortic valve stenosis.   Pulmonic Valve: The pulmonic valve was not well visualized. Pulmonic valve  regurgitation is not visualized.   Aorta: The aortic root is normal in size and structure.   IAS/Shunts: No atrial level shunt detected by color flow Doppler.   Patient Profile     88 y.o. male   Assessment & Plan    Symptomatic second-degree AV  block-now with marked sinus bradycardia and intermittent pauses.  Recent small dose of Cardizem we will continue to monitor.  EP on board.  Plan for reassessment and reevaluating rhythm on telemetry.  If no significant improvement plan for PPM tomorrow.  Echocardiogram pending.  Paroxysmal atrial fibrillation-Xarelto is currently on hold if need for PPM.    Vasovagal syncope-continue to monitor.  Chronic kidney disease-creatinine at baseline we will continue to monitor avoid nephrotoxins.  Hyperlipidemia  DVT/prophylaxis-on chronic Xarelto which is currently on hold for potential PPM.  Length of stay: 1 day- plans for likely discharge tomorrow  For questions or updates, please contact [facility] Please consult [email or web address] for contact info under Cardiology/STEMI.      Signed, Thomasene Ripple, DO  11/14/2021, 10:46 AM

## 2021-11-14 NOTE — TOC Initial Note (Signed)
Transition of Care [facility]) - Initial/Assessment Note    Patient Details  Name: [name] MRN: [id number] Date of Birth: 1932-06-10  [facility]) CM/SW Contact:    Durenda Guthrie, RN Phone Number: 11/14/2021, 11:50 AM  Clinical Narrative:   Transition of Care Screening Note:      Transition of Care Department [facility]) has reviewed patient and no TOC needs have been identified at this time. We will continue to monitor patient advancement through Interdisciplinary progressions. If new patient transition needs arise, please place a consult.                     Patient Goals and CMS Choice        Expected Discharge Plan and Services                                                Prior Living Arrangements/Services                       Activities of Daily Living Home Assistive Devices/Equipment: None ADL Screening (condition at time of admission) Patient's cognitive ability adequate to safely complete daily activities?: Yes Is the patient deaf or have difficulty hearing?: No Does the patient have difficulty seeing, even when wearing glasses/contacts?: No Does the patient have difficulty concentrating, remembering, or making decisions?: No Patient able to express need for assistance with ADLs?: No Does the patient have difficulty dressing or bathing?: No Independently performs ADLs?: Yes (appropriate for developmental age) Does the patient have difficulty walking or climbing stairs?: No Weakness of Legs: None Weakness of Arms/Hands: None  Permission Sought/Granted                  Emotional Assessment              Admission diagnosis:  Heart block [I45.9] Patient Active Problem List   Diagnosis Date Noted   Heart block 11/14/2021   CKD (chronic kidney disease), stage III (HCC) 03/24/2017   Mild aortic insufficiency    Mild dilation of ascending aorta (HCC)    PAF (paroxysmal atrial fibrillation) (HCC)     Vasovagal episode 05/23/2015   Rectus diastasis, mild 08/01/2011   Right groin pain 08/01/2011   Other and unspecified hyperlipidemia 12/12/2010   Varicose veins 12/12/2010   RHINOSINUSITIS, ACUTE 09/01/2009   ACUTE BRONCHITIS 09/01/2009   Pulmonary embolism on long-term anticoagulation therapy (HCC) 02/14/2007   Acute thromboembolism of deep veins of lower extremity (HCC) 02/14/2007   Lung nodule 02/14/2007   GERD 02/14/2007   BENIGN PROSTATIC HYPERTROPHY, HX OF 02/14/2007   PCP:  Prudence Davidson, MD Pharmacy:   [facility] [address] [place], [place] - [address] AT [facility] [address] [place] [address] Phone: [phone] Fax: [phone]     Social Determinants of Health (SDOH) Interventions    Readmission Risk Interventions     No data to display

## 2021-11-14 NOTE — Plan of Care (Signed)

## 2021-11-14 NOTE — Progress Notes (Signed)
  EP aware of pt and reviewed case with Dr. Elberta Fortis  Last dose diltiazem 120 mg Monday night.   Fatigue and weakness through weekend and presented to [facility] with 2:1 AV block.   Brief 2:1 AV block overnight and this am, but denies symptoms in room.   We discussed indications for pacing. Will let diltiazem continue to wash out and consider pacing vs discharge with Zio. Pending update echo this am.   Explained risks, benefits, and alternatives to PPM implantation, including but not limited to bleeding, infection, pneumothorax, pericardial effusion, lead dislodgement, heart attack, stroke, or death.  Pt verbalized understanding and agrees to proceed if indicated.   Pt OK to eat today. Clears after midnight, NPO after clear breakfast in case pacing is warranted.   Casimiro Needle [address]" [place], PA-C  11/14/2021 11:24 AM

## 2021-11-15 ENCOUNTER — Encounter (HOSPITAL_COMMUNITY)
Admission: EM | Disposition: A | Discharge status: Home / Self Care | Payer: Self-pay | Source: Other Acute Inpatient Hospital | Attending: Cardiology

## 2021-11-15 DIAGNOSIS — I480 Paroxysmal atrial fibrillation: Secondary | ICD-10-CM

## 2021-11-15 DIAGNOSIS — I441 Atrioventricular block, second degree: Secondary | ICD-10-CM | POA: Diagnosis not present

## 2021-11-15 HISTORY — PX: PACEMAKER IMPLANT: EP1218

## 2021-11-15 LAB — SURGICAL PCR SCREEN
MRSA, PCR: NEGATIVE
Staphylococcus aureus: NEGATIVE

## 2021-11-15 LAB — GLUCOSE, CAPILLARY
Glucose-Capillary: 87 mg/dL (ref 70–99)
Glucose-Capillary: 95 mg/dL (ref 70–99)

## 2021-11-15 SURGERY — PACEMAKER IMPLANT

## 2021-11-15 MED ORDER — SODIUM CHLORIDE 0.9 % IV SOLN
INTRAVENOUS | Status: AC
  Filled 2021-11-15: qty 2

## 2021-11-15 MED ORDER — MIDAZOLAM HCL 5 MG/5ML IJ SOLN
INTRAMUSCULAR | Status: DC | PRN
  Administered 2021-11-15: 1 mg via INTRAVENOUS

## 2021-11-15 MED ORDER — SODIUM CHLORIDE 0.9 % IV SOLN: INTRAVENOUS | Status: DC

## 2021-11-15 MED ORDER — CEFAZOLIN SODIUM-DEXTROSE 1-4 GM/50ML-% IV SOLN
1.0000 g | Freq: Four times a day (QID) | INTRAVENOUS | Status: AC
  Administered 2021-11-15 – 2021-11-16 (×3): 1 g via INTRAVENOUS
  Filled 2021-11-15 (×3): qty 50

## 2021-11-15 MED ORDER — LIDOCAINE HCL (PF) 1 % IJ SOLN
INTRAMUSCULAR | Status: AC
  Filled 2021-11-15: qty 60

## 2021-11-15 MED ORDER — FENTANYL CITRATE (PF) 100 MCG/2ML IJ SOLN
INTRAMUSCULAR | Status: AC
  Filled 2021-11-15: qty 2

## 2021-11-15 MED ORDER — LIDOCAINE HCL (PF) 1 % IJ SOLN
INTRAMUSCULAR | Status: DC | PRN
  Administered 2021-11-15: 60 mL

## 2021-11-15 MED ORDER — ACETAMINOPHEN 325 MG PO TABS: 325.0000 mg | ORAL_TABLET | ORAL | Status: DC | PRN

## 2021-11-15 MED ORDER — HEPARIN (PORCINE) IN NACL 1000-0.9 UT/500ML-% IV SOLN
INTRAVENOUS | Status: DC | PRN
  Administered 2021-11-15: 500 mL

## 2021-11-15 MED ORDER — CEFAZOLIN SODIUM-DEXTROSE 2-4 GM/100ML-% IV SOLN
INTRAVENOUS | Status: AC
  Filled 2021-11-15: qty 100

## 2021-11-15 MED ORDER — MIDAZOLAM HCL 5 MG/5ML IJ SOLN
INTRAMUSCULAR | Status: AC
  Filled 2021-11-15: qty 5

## 2021-11-15 MED ORDER — ONDANSETRON HCL 4 MG/2ML IJ SOLN: 4.0000 mg | Freq: Four times a day (QID) | INTRAMUSCULAR | Status: DC | PRN

## 2021-11-15 MED ORDER — CHLORHEXIDINE GLUCONATE 4 % EX LIQD
60.0000 mL | Freq: Once | CUTANEOUS | Status: DC
  Filled 2021-11-15: qty 60

## 2021-11-15 MED ORDER — HEPARIN (PORCINE) IN NACL 1000-0.9 UT/500ML-% IV SOLN
INTRAVENOUS | Status: AC
  Filled 2021-11-15: qty 500

## 2021-11-15 MED ORDER — CHLORHEXIDINE GLUCONATE 4 % EX LIQD: 60.0000 mL | Freq: Once | CUTANEOUS | Status: DC

## 2021-11-15 MED ORDER — SODIUM CHLORIDE 0.9 % IV SOLN
80.0000 mg | INTRAVENOUS | Status: AC
  Administered 2021-11-15: 80 mg

## 2021-11-15 MED ORDER — CEFAZOLIN SODIUM-DEXTROSE 2-4 GM/100ML-% IV SOLN
2.0000 g | INTRAVENOUS | Status: AC
  Administered 2021-11-15: 2 g via INTRAVENOUS

## 2021-11-15 MED ORDER — FENTANYL CITRATE (PF) 100 MCG/2ML IJ SOLN
INTRAMUSCULAR | Status: DC | PRN
  Administered 2021-11-15: 25 ug via INTRAVENOUS

## 2021-11-15 SURGICAL SUPPLY — 13 items
CABLE SURGICAL S-101-97-12 (CABLE) ×2 IMPLANT
CATH RIGHTSITE C315HIS02 (CATHETERS) ×1 IMPLANT
IPG PACE AZUR XT DR MRI W1DR01 (Pacemaker) IMPLANT
LEAD CAPSURE NOVUS 5076-52CM (Lead) ×1 IMPLANT
LEAD SELECT SECURE 3830 383069 (Lead) IMPLANT
PACE AZURE XT DR MRI W1DR01 (Pacemaker) ×2 IMPLANT
PAD DEFIB RADIO PHYSIO CONN (PAD) ×2 IMPLANT
SELECT SECURE 3830 383069 (Lead) ×2 IMPLANT
SHEATH 7FR PRELUDE SNAP 13 (SHEATH) ×2 IMPLANT
SHEATH PROBE COVER 6X72 (BAG) ×1 IMPLANT
SLITTER 6232ADJ (MISCELLANEOUS) ×1 IMPLANT
TRAY PACEMAKER INSERTION (PACKS) ×2 IMPLANT
WIRE HI TORQ VERSACORE-J 145CM (WIRE) ×1 IMPLANT

## 2021-11-15 NOTE — Interval H&P Note (Signed)
History and Physical Interval Note:  11/15/2021 3:12 PM  [name]  has presented today for surgery, with the diagnosis of 2nd degree heart block.  The various methods of treatment have been discussed with the patient and family. After consideration of risks, benefits and other options for treatment, the patient has consented to  Procedure(s): PACEMAKER IMPLANT (N/A) as a surgical intervention.  The patient's history has been reviewed, patient examined, no change in status, stable for surgery.  I have reviewed the patient's chart and labs.  Questions were answered to the patient's satisfaction.     Niranjan Rufener [employer]

## 2021-11-15 NOTE — H&P (View-Only) (Signed)
ELECTROPHYSIOLOGY CONSULT NOTE    Patient ID: [name] MRN: [id number], DOB/AGE: [id number] y.o.  Admit date: 11/14/2021 Date of Consult: 11/15/2021  Primary Physician: Prudence Davidson, MD Primary Cardiologist: Verne Carrow, MD  Electrophysiologist:  New  Referring Provider: Dr. Servando Salina  Patient Profile: [name] is a 88 y.o. male with a history of mild AI, PAF, HLD, h/o vasovagal syncope, thoracic aortic aneurysm, RBBB, CKD III, also has h/o DVT/PE w Lupus anticoagulant  who is being seen today for the evaluation of symptomatic second degree heart block at the request of Dr. Servando Salina.  HPI:  [name] is a 88 y.o. male with medical history as above.   Pt had fatigue over the weekend and spoke to his insurance who recommended he seek care due to bradycardia at home. He presented to [facility] and was found to have 2:1 AVB and transferred to [facility] for PPM consideration.   Pertinent labs include K 4.4, Cr 1.56, MG 2.0, WBC 7.0, Hgb 13.5  Echo 11/14/2021 55-60%  Currently he is at rest. Was able to walk the halls yesterday without much difficulty. HR has been up and down, but no higher than 70s with activity. Currently, he denies chest pain, palpitations, dyspnea, PND, orthopnea, nausea, vomiting, dizziness, edema, weight gain, or early satiety.   He has a history of syncope after receiving sedation that was felt to be vasovagal  Past Medical History:  Diagnosis Date   BPH (benign prostatic hyperplasia)    CKD (chronic kidney disease), stage III (HCC)    DVT (deep venous thrombosis) (HCC)    GERD (gastroesophageal reflux disease)    Hernia    Hyperlipidemia    Lung nodule    Mild aortic insufficiency    Mild dilation of ascending aorta (HCC)    PAF (paroxysmal atrial fibrillation) (HCC)    Pulmonary embolism (HCC)    RBBB    Syncope      Surgical History:  Past Surgical History:  Procedure Laterality Date   EYE SURGERY  1957    INGUINAL HERNIA REPAIR     x 3   Left leg trauma     LIPOMA RESECTION       Medications Prior to Admission  Medication Sig Dispense Refill Last Dose   alfuzosin (UROXATRAL) 10 MG 24 hr tablet Take 10 mg by mouth daily with breakfast.   Past Week   Cholecalciferol (VITAMIN D-3 PO) Take 1 capsule by mouth daily.   Past Week   Cyanocobalamin (VITAMIN B-12 PO) Take 1 tablet by mouth daily.   Past Week   diltiazem (CARDIZEM CD) 120 MG 24 hr capsule Take 1 capsule (120 mg total) by mouth daily. 90 capsule 3 Past Week   esomeprazole (NEXIUM) 20 MG capsule Take 20 mg by mouth daily at 12 noon.   Past Week   ezetimibe (ZETIA) 10 MG tablet Take 1 tablet (10 mg total) by mouth daily. 30 tablet 11 Past Week   lubiprostone (AMITIZA) 8 MCG capsule Take 8 mcg by mouth daily as needed for constipation.   Past Month   XARELTO 15 MG TABS tablet TAKE 1 TABLET(15 MG) BY MOUTH DAILY WITH SUPPER (Patient taking differently: Take 15 mg by mouth daily with supper.) 90 tablet 1 11/12/2021 at 23:30   Ciclopirox 1 % shampoo Apply 1 Application topically See admin instructions. 2-3 times a week (Patient not taking: Reported on 11/14/2021)   Not Taking    Inpatient Medications:   sodium chloride  flush  3 mL Intravenous Q12H    Allergies:  Allergies  Allergen Reactions   Tape Rash   Anti-Oxidant Other (See Comments)    GI upset   Centrum Other (See Comments)    GI upset   Latex Rash    Social History   Socioeconomic History   Marital status: Married    Spouse name: Not on file   Number of children: 2   Years of education: Not on file   Highest education level: Not on file  Occupational History   Occupation: Horticulturist, commercial for [employer], educational  Tobacco Use   Smoking status: Never   Smokeless tobacco: Never  Vaping Use   Vaping Use: Never used  Substance and Sexual Activity   Alcohol use: No   Drug use: No   Sexual activity: Not on file  Other Topics Concern   Not on file  Social  History Narrative   Right Handed    Lives in a one story home   Drinks Caffeine    Social Determinants of Health   Financial Resource Strain: Not on file  Food Insecurity: Not on file  Transportation Needs: Not on file  Physical Activity: Not on file  Stress: Not on file  Social Connections: Not on file  Intimate Partner Violence: Not on file     Family History  Problem Relation Age of Onset   Heart failure Father    Heart attack Father        x2   Cancer Mother        of eye-causing her to lose her eye     Review of Systems: All other systems reviewed and are otherwise negative except as noted above.  Physical Exam: Vitals:   11/14/21 1639 11/14/21 2024 11/14/21 2312 11/15/21 0419  BP: 120/72 (!) 142/85 127/85 112/66  Pulse: (!) 57 61 61 62  Resp: 18 16 18 17   Temp: 98.5 F (36.9 C) 98 F (36.7 C) 98.2 F (36.8 C) 98.2 F (36.8 C)  TempSrc: Oral Oral Oral Oral  SpO2: 99% 98% 98% 95%  Weight:      Height:        GEN- The patient is well appearing, alert and oriented x 3 today.   HEENT: normocephalic, atraumatic; sclera clear, conjunctiva pink; hearing intact; oropharynx clear; neck supple Lungs- Clear to ausculation bilaterally, normal work of breathing.  No wheezes, rales, rhonchi Heart- Regular rate and rhythm, no murmurs, rubs or gallops GI- soft, non-tender, non-distended, bowel sounds present Extremities- no clubbing, cyanosis, or edema; DP/PT/radial pulses 2+ bilaterally MS- no significant deformity or atrophy Skin- warm and dry, no rash or lesion Psych- euthymic mood, full affect Neuro- strength and sensation are intact  Labs:   Lab Results  Component Value Date   WBC 7.0 11/14/2021   HGB 13.5 11/14/2021   HCT 38.9 (L) 11/14/2021   MCV 96.3 11/14/2021   PLT 181 11/14/2021    Recent Labs  Lab 11/14/21 0538  NA 142  K 4.4  CL 111  CO2 23  BUN 19  CREATININE 1.56*  CALCIUM 8.8*  PROT 6.3*  BILITOT 0.8  ALKPHOS 46  ALT 19  AST 21   GLUCOSE 98      Radiology/Studies: ECHOCARDIOGRAM COMPLETE  Result Date: 11/14/2021    ECHOCARDIOGRAM REPORT   Patient Name:   [name] Date of Exam: 11/14/2021 Medical Rec #:  01/15/2022          Height:  69.0 in Accession #:    [id number]         Weight:       173.3 lb Date of Birth:  [birth year]         BSA:          1.944 m Patient Age:    32 years           BP:           159/69 mmHg Patient Gender: M                  HR:           45 bpm. Exam Location:  Inpatient Procedure: 2D Echo, Cardiac Doppler and Color Doppler Indications:    Heart block  History:        Patient has prior history of Echocardiogram examinations.  Sonographer:    Jyl Justin Referring Phys: [id number] Mucarabones  1. Left ventricular ejection fraction, by estimation, is 55 to 60%. The left ventricle has normal function. The left ventricle has no regional wall motion abnormalities. Left ventricular diastolic parameters are indeterminate.  2. Right ventricular systolic function is normal. The right ventricular size is normal. There is normal pulmonary artery systolic pressure. The estimated right ventricular systolic pressure is [id number] mmHg.  3. The mitral valve is degenerative. Mild mitral valve regurgitation. No evidence of mitral stenosis.  4. The aortic valve is tricuspid. Aortic valve regurgitation is mild. Aortic valve sclerosis/calcification is present, without any evidence of aortic stenosis. Aortic regurgitation PHT measures 1572 msec. Aortic valve Vmax measures 1.39 m/s.  5. Aortic dilatation noted. There is mild dilatation of the aortic root, measuring 38 mm. There is mild dilatation of the ascending aorta, measuring 39 mm.  6. The inferior vena cava is dilated in size with >50% respiratory variability, suggesting right atrial pressure of 8 mmHg. FINDINGS  Left Ventricle: Left ventricular ejection fraction, by estimation, is 55 to 60%. The left ventricle has normal function. The left ventricle has  no regional wall motion abnormalities. The left ventricular internal cavity size was normal in size. There is  no left ventricular hypertrophy. Left ventricular diastolic parameters are indeterminate. Normal left ventricular filling pressure. Right Ventricle: The right ventricular size is normal. No increase in right ventricular wall thickness. Right ventricular systolic function is normal. There is normal pulmonary artery systolic pressure. The tricuspid regurgitant velocity is 2.30 m/s, and  with an assumed right atrial pressure of 8 mmHg, the estimated right ventricular systolic pressure is [id number] mmHg. Left Atrium: Left atrial size was normal in size. Right Atrium: Right atrial size was normal in size. Pericardium: There is no evidence of pericardial effusion. Mitral Valve: The mitral valve is degenerative in appearance. There is mild calcification of the anterior mitral valve leaflet(s). Mild mitral valve regurgitation. No evidence of mitral valve stenosis. Tricuspid Valve: The tricuspid valve is normal in structure. Tricuspid valve regurgitation is mild . No evidence of tricuspid stenosis. Aortic Valve: The aortic valve is tricuspid. Aortic valve regurgitation is mild. Aortic regurgitation PHT measures 1572 msec. Aortic valve sclerosis/calcification is present, without any evidence of aortic stenosis. Aortic valve peak gradient measures 7.7 mmHg. Pulmonic Valve: The pulmonic valve was normal in structure. Pulmonic valve regurgitation is not visualized. No evidence of pulmonic stenosis. Aorta: Aortic dilatation noted. There is mild dilatation of the aortic root, measuring 38 mm. There is mild dilatation of the ascending aorta, measuring 39 mm. Venous: The inferior vena cava is dilated in  size with greater than 50% respiratory variability, suggesting right atrial pressure of 8 mmHg. IAS/Shunts: No atrial level shunt detected by color flow Doppler.  LEFT VENTRICLE PLAX 2D LVIDd:         4.00 cm      Diastology  LVIDs:         2.70 cm      LV e' medial:    6.31 cm/s LV PW:         0.90 cm      LV E/e' medial:  11.6 LV IVS:        1.10 cm      LV e' lateral:   5.77 cm/s LVOT diam:     2.00 cm      LV E/e' lateral: 12.7 LV SV:         98 LV SV Index:   50 LVOT Area:     3.14 cm  LV Volumes (MOD) LV vol d, MOD A2C: 123.0 ml LV vol d, MOD A4C: 91.3 ml LV vol s, MOD A2C: 50.6 ml LV vol s, MOD A4C: 39.3 ml LV SV MOD A2C:     72.4 ml LV SV MOD A4C:     91.3 ml LV SV MOD BP:      64.0 ml RIGHT VENTRICLE          IVC RV Basal diam:  2.80 cm  IVC diam: 2.70 cm RV Mid diam:    2.20 cm LEFT ATRIUM             Index        RIGHT ATRIUM           Index LA diam:        3.00 cm 1.54 cm/m   RA Area:     12.00 cm LA Vol (A2C):   54.8 ml 28.19 ml/m  RA Volume:   25.70 ml  13.22 ml/m LA Vol (A4C):   27.4 ml 14.10 ml/m LA Biplane Vol: 39.4 ml 20.27 ml/m  AORTIC VALVE AV Area (Vmax): 3.48 cm AV Vmax:        139.00 cm/s AV Peak Grad:   7.7 mmHg LVOT Vmax:      154.00 cm/s LVOT Vmean:     87.400 cm/s LVOT VTI:       0.311 m AI PHT:         1572 msec  AORTA Ao Root diam: 3.80 cm Ao Asc diam:  3.90 cm MITRAL VALVE               TRICUSPID VALVE MV Area (PHT): 3.45 cm    TR Peak grad:   21.2 mmHg MV Decel Time: 220 msec    TR Vmax:        230.00 cm/s MR Peak grad: 71.9 mmHg MR Vmax:      424.00 cm/s  SHUNTS MV E velocity: 73.40 cm/s  Systemic VTI:  0.31 m MV A velocity: 78.10 cm/s  Systemic Diam: 2.00 cm MV E/A ratio:  0.94 Fransico Him MD Electronically signed by Fransico Him MD Signature Date/Time: 11/14/2021/12:16:13 PM    Final    X-ray chest PA and lateral  Result Date: 11/14/2021 CLINICAL DATA:  Encounter for hard plaque. EXAM: CHEST - 2 VIEW COMPARISON:  PA Lat 10/16/2021 FINDINGS: The heart size and mediastinal contours are within normal limits apart from aortic tortuosity with calcification in the arch. Both lungs are clear although less well inflated than previously. The visualized skeletal structures are intact with  mild lower  thoracic dextroscoliosis, mild osteopenia and thoracic spondylosis. A partially translucent electrical pad overlies the posterior lower left chest. There are multiple overlying monitor wires. IMPRESSION: No active cardiopulmonary disease. Aortic atherosclerosis and uncoiling. Osteopenia and degenerative change. Electronically Signed   By: Telford Nab M.D.   On: 11/14/2021 06:04   DG Chest 2 View  Result Date: 10/17/2021 CLINICAL DATA:  Paroxysmal atrial fibrillation. EXAM: CHEST - 2 VIEW COMPARISON:  11/30/2006.  CT, 02/03/2007. FINDINGS: Cardiac silhouette is normal in size and configuration. No mediastinal or hilar masses. No evidence of adenopathy. Clear lungs.  No pleural effusion or pneumothorax. Skeletal structures are intact. IMPRESSION: No active cardiopulmonary disease. Electronically Signed   By: Lajean Manes M.D.   On: 10/17/2021 15:40    EKG: on arrival shows sinus bradycardia at 47 bpm with RBBB (personally reviewed)  TELEMETRY: Sinus Loletha Grayer 40-60s with intermittent 2:1 AVB (personally reviewed)  Assessment/Plan: 1.  Symptomatic second degree AV block Without reversible cause identified. Continues intermittently despite diltiazem wash out Explained risks, benefits, and alternatives to PPM implantation, including but not limited to bleeding, infection, pneumothorax, pericardial effusion, lead dislodgement, heart attack, stroke, or death.  Pt verbalized understanding and agrees to proceed if indicated.   2. PAF Resume xarelto post pacing Can follow burden/rates with device to determine need to add back BB  3. H/o DVT/PE w/ Lupus anticoagulant Resume xarelto post pacing  Dr. Curt Bears has seen. Plan for pacing today pending census.   For questions or updates, please contact [place] Please consult [email or web address] for contact info under Cardiology/STEMI.  Signed, Shirley Friar, PA-C  11/15/2021 7:23 AM  I have seen and examined this patient with Oda Kilts.   Agree with above, note added to reflect my findings.  Patient presented to the hospital with episodes of fatigue.  He was found to be in 2-1 AV block.  He was initially on diltiazem, which was stopped.  Unfortunately 2-1 AV block has since continued.  GEN: Well nourished, well developed, in no acute distress  HEENT: normal  Neck: no JVD, carotid bruits, or masses Cardiac: RRR; no murmurs, rubs, or gallops,no edema  Respiratory:  clear to auscultation bilaterally, normal work of breathing GI: soft, nontender, nondistended, + BS MS: no deformity or atrophy  Skin: warm and dry Neuro:  Strength and sensation are intact Psych: euthymic mood, full affect   Second-degree AV block: Patient has intermittent second-degree AV block, at times during waking hours.  Due to that, pacemaker implant would be indicated.  Risk and benefits were discussed risk of bleeding, tamponade, infection, pneumothorax, lead dislodgment.  He understands these risks and is agreed to the procedure. Paroxysmal atrial fibrillation: Currently on Xarelto for anticoagulation.  We Rayn Enderson hold due to pacemaker implant.  We Shailene Demonbreun need to restart as he has lupus anticoagulant.  Arris Meyn M. Belissa Kooy MD 11/15/2021 12:53 PM

## 2021-11-15 NOTE — Consult Note (Addendum)
ELECTROPHYSIOLOGY CONSULT NOTE    Patient ID: [name] MRN: [id number], DOB/AGE: [birth year] 86 y.o.  Admit date: 11/14/2021 Date of Consult: 11/15/2021  Primary Physician: Prudence Davidson, MD Primary Cardiologist: Verne Carrow, MD  Electrophysiologist:  New  Referring Provider: Dr. Servando Salina  Patient Profile: [name] is a 88 y.o. male with a history of mild AI, PAF, HLD, h/o vasovagal syncope, thoracic aortic aneurysm, RBBB, CKD III, also has h/o DVT/PE w Lupus anticoagulant  who is being seen today for the evaluation of symptomatic second degree heart block at the request of Dr. Servando Salina.  HPI:  [name] is a 88 y.o. male with medical history as above.   Pt had fatigue over the weekend and spoke to his insurance who recommended he seek care due to bradycardia at home. He presented to [facility] and was found to have 2:1 AVB and transferred to [facility] for PPM consideration.   Pertinent labs include K 4.4, Cr 1.56, MG 2.0, WBC 7.0, Hgb 13.5  Echo 11/14/2021 55-60%  Currently he is at rest. Was able to walk the halls yesterday without much difficulty. HR has been up and down, but no higher than 70s with activity. Currently, he denies chest pain, palpitations, dyspnea, PND, orthopnea, nausea, vomiting, dizziness, edema, weight gain, or early satiety.   He has a history of syncope after receiving sedation that was felt to be vasovagal  Past Medical History:  Diagnosis Date   BPH (benign prostatic hyperplasia)    CKD (chronic kidney disease), stage III (HCC)    DVT (deep venous thrombosis) (HCC)    GERD (gastroesophageal reflux disease)    Hernia    Hyperlipidemia    Lung nodule    Mild aortic insufficiency    Mild dilation of ascending aorta (HCC)    PAF (paroxysmal atrial fibrillation) (HCC)    Pulmonary embolism (HCC)    RBBB    Syncope      Surgical History:  Past Surgical History:  Procedure Laterality Date   EYE SURGERY  1957    INGUINAL HERNIA REPAIR     x 3   Left leg trauma     LIPOMA RESECTION       Medications Prior to Admission  Medication Sig Dispense Refill Last Dose   alfuzosin (UROXATRAL) 10 MG 24 hr tablet Take 10 mg by mouth daily with breakfast.   Past Week   Cholecalciferol (VITAMIN D-3 PO) Take 1 capsule by mouth daily.   Past Week   Cyanocobalamin (VITAMIN B-12 PO) Take 1 tablet by mouth daily.   Past Week   diltiazem (CARDIZEM CD) 120 MG 24 hr capsule Take 1 capsule (120 mg total) by mouth daily. 90 capsule 3 Past Week   esomeprazole (NEXIUM) 20 MG capsule Take 20 mg by mouth daily at 12 noon.   Past Week   ezetimibe (ZETIA) 10 MG tablet Take 1 tablet (10 mg total) by mouth daily. 30 tablet 11 Past Week   lubiprostone (AMITIZA) 8 MCG capsule Take 8 mcg by mouth daily as needed for constipation.   Past Month   XARELTO 15 MG TABS tablet TAKE 1 TABLET(15 MG) BY MOUTH DAILY WITH SUPPER (Patient taking differently: Take 15 mg by mouth daily with supper.) 90 tablet 1 11/12/2021 at 23:30   Ciclopirox 1 % shampoo Apply 1 Application topically See admin instructions. 2-3 times a week (Patient not taking: Reported on 11/14/2021)   Not Taking    Inpatient Medications:   sodium chloride  flush  3 mL Intravenous Q12H    Allergies:  Allergies  Allergen Reactions   Tape Rash   Anti-Oxidant Other (See Comments)    GI upset   Centrum Other (See Comments)    GI upset   Latex Rash    Social History   Socioeconomic History   Marital status: Married    Spouse name: Not on file   Number of children: 2   Years of education: Not on file   Highest education level: Not on file  Occupational History   Occupation: Horticulturist, commercial for [employer], educational  Tobacco Use   Smoking status: Never   Smokeless tobacco: Never  Vaping Use   Vaping Use: Never used  Substance and Sexual Activity   Alcohol use: No   Drug use: No   Sexual activity: Not on file  Other Topics Concern   Not on file  Social  History Narrative   Right Handed    Lives in a one story home   Drinks Caffeine    Social Determinants of Health   Financial Resource Strain: Not on file  Food Insecurity: Not on file  Transportation Needs: Not on file  Physical Activity: Not on file  Stress: Not on file  Social Connections: Not on file  Intimate Partner Violence: Not on file     Family History  Problem Relation Age of Onset   Heart failure Father    Heart attack Father        x2   Cancer Mother        of eye-causing her to lose her eye     Review of Systems: All other systems reviewed and are otherwise negative except as noted above.  Physical Exam: Vitals:   11/14/21 1639 11/14/21 2024 11/14/21 2312 11/15/21 0419  BP: 120/72 (!) 142/85 127/85 112/66  Pulse: (!) 57 61 61 62  Resp: 18 16 18 17   Temp: 98.5 F (36.9 C) 98 F (36.7 C) 98.2 F (36.8 C) 98.2 F (36.8 C)  TempSrc: Oral Oral Oral Oral  SpO2: 99% 98% 98% 95%  Weight:      Height:        GEN- The patient is well appearing, alert and oriented x 3 today.   HEENT: normocephalic, atraumatic; sclera clear, conjunctiva pink; hearing intact; oropharynx clear; neck supple Lungs- Clear to ausculation bilaterally, normal work of breathing.  No wheezes, rales, rhonchi Heart- Regular rate and rhythm, no murmurs, rubs or gallops GI- soft, non-tender, non-distended, bowel sounds present Extremities- no clubbing, cyanosis, or edema; DP/PT/radial pulses 2+ bilaterally MS- no significant deformity or atrophy Skin- warm and dry, no rash or lesion Psych- euthymic mood, full affect Neuro- strength and sensation are intact  Labs:   Lab Results  Component Value Date   WBC 7.0 11/14/2021   HGB 13.5 11/14/2021   HCT 38.9 (L) 11/14/2021   MCV 96.3 11/14/2021   PLT 181 11/14/2021    Recent Labs  Lab 11/14/21 0538  NA 142  K 4.4  CL 111  CO2 23  BUN 19  CREATININE 1.56*  CALCIUM 8.8*  PROT 6.3*  BILITOT 0.8  ALKPHOS 46  ALT 19  AST 21   GLUCOSE 98      Radiology/Studies: ECHOCARDIOGRAM COMPLETE  Result Date: 11/14/2021    ECHOCARDIOGRAM REPORT   Patient Name:   [name] Date of Exam: 11/14/2021 Medical Rec #:  01/15/2022          Height:  69.0 in Accession #:    [id number]         Weight:       173.3 lb Date of Birth:  03/24/1933         BSA:          1.944 m Patient Age:    56 years           BP:           159/69 mmHg Patient Gender: M                  HR:           45 bpm. Exam Location:  Inpatient Procedure: 2D Echo, Cardiac Doppler and Color Doppler Indications:    Heart block  History:        Patient has prior history of Echocardiogram examinations.  Sonographer:    Jyl Justin Referring Phys: [id number] [place]  1. Left ventricular ejection fraction, by estimation, is 55 to 60%. The left ventricle has normal function. The left ventricle has no regional wall motion abnormalities. Left ventricular diastolic parameters are indeterminate.  2. Right ventricular systolic function is normal. The right ventricular size is normal. There is normal pulmonary artery systolic pressure. The estimated right ventricular systolic pressure is [id number] mmHg.  3. The mitral valve is degenerative. Mild mitral valve regurgitation. No evidence of mitral stenosis.  4. The aortic valve is tricuspid. Aortic valve regurgitation is mild. Aortic valve sclerosis/calcification is present, without any evidence of aortic stenosis. Aortic regurgitation PHT measures 1572 msec. Aortic valve Vmax measures 1.39 m/s.  5. Aortic dilatation noted. There is mild dilatation of the aortic root, measuring 38 mm. There is mild dilatation of the ascending aorta, measuring 39 mm.  6. The inferior vena cava is dilated in size with >50% respiratory variability, suggesting right atrial pressure of 8 mmHg. FINDINGS  Left Ventricle: Left ventricular ejection fraction, by estimation, is 55 to 60%. The left ventricle has normal function. The left ventricle has  no regional wall motion abnormalities. The left ventricular internal cavity size was normal in size. There is  no left ventricular hypertrophy. Left ventricular diastolic parameters are indeterminate. Normal left ventricular filling pressure. Right Ventricle: The right ventricular size is normal. No increase in right ventricular wall thickness. Right ventricular systolic function is normal. There is normal pulmonary artery systolic pressure. The tricuspid regurgitant velocity is 2.30 m/s, and  with an assumed right atrial pressure of 8 mmHg, the estimated right ventricular systolic pressure is [id number] mmHg. Left Atrium: Left atrial size was normal in size. Right Atrium: Right atrial size was normal in size. Pericardium: There is no evidence of pericardial effusion. Mitral Valve: The mitral valve is degenerative in appearance. There is mild calcification of the anterior mitral valve leaflet(s). Mild mitral valve regurgitation. No evidence of mitral valve stenosis. Tricuspid Valve: The tricuspid valve is normal in structure. Tricuspid valve regurgitation is mild . No evidence of tricuspid stenosis. Aortic Valve: The aortic valve is tricuspid. Aortic valve regurgitation is mild. Aortic regurgitation PHT measures 1572 msec. Aortic valve sclerosis/calcification is present, without any evidence of aortic stenosis. Aortic valve peak gradient measures 7.7 mmHg. Pulmonic Valve: The pulmonic valve was normal in structure. Pulmonic valve regurgitation is not visualized. No evidence of pulmonic stenosis. Aorta: Aortic dilatation noted. There is mild dilatation of the aortic root, measuring 38 mm. There is mild dilatation of the ascending aorta, measuring 39 mm. Venous: The inferior vena cava is dilated in  size with greater than 50% respiratory variability, suggesting right atrial pressure of 8 mmHg. IAS/Shunts: No atrial level shunt detected by color flow Doppler.  LEFT VENTRICLE PLAX 2D LVIDd:         4.00 cm      Diastology  LVIDs:         2.70 cm      LV e' medial:    6.31 cm/s LV PW:         0.90 cm      LV E/e' medial:  11.6 LV IVS:        1.10 cm      LV e' lateral:   5.77 cm/s LVOT diam:     2.00 cm      LV E/e' lateral: 12.7 LV SV:         98 LV SV Index:   50 LVOT Area:     3.14 cm  LV Volumes (MOD) LV vol d, MOD A2C: 123.0 ml LV vol d, MOD A4C: 91.3 ml LV vol s, MOD A2C: 50.6 ml LV vol s, MOD A4C: 39.3 ml LV SV MOD A2C:     72.4 ml LV SV MOD A4C:     91.3 ml LV SV MOD BP:      64.0 ml RIGHT VENTRICLE          IVC RV Basal diam:  2.80 cm  IVC diam: 2.70 cm RV Mid diam:    2.20 cm LEFT ATRIUM             Index        RIGHT ATRIUM           Index LA diam:        3.00 cm 1.54 cm/m   RA Area:     12.00 cm LA Vol (A2C):   54.8 ml 28.19 ml/m  RA Volume:   25.70 ml  13.22 ml/m LA Vol (A4C):   27.4 ml 14.10 ml/m LA Biplane Vol: 39.4 ml 20.27 ml/m  AORTIC VALVE AV Area (Vmax): 3.48 cm AV Vmax:        139.00 cm/s AV Peak Grad:   7.7 mmHg LVOT Vmax:      154.00 cm/s LVOT Vmean:     87.400 cm/s LVOT VTI:       0.311 m AI PHT:         1572 msec  AORTA Ao Root diam: 3.80 cm Ao Asc diam:  3.90 cm MITRAL VALVE               TRICUSPID VALVE MV Area (PHT): 3.45 cm    TR Peak grad:   21.2 mmHg MV Decel Time: 220 msec    TR Vmax:        230.00 cm/s MR Peak grad: 71.9 mmHg MR Vmax:      424.00 cm/s  SHUNTS MV E velocity: 73.40 cm/s  Systemic VTI:  0.31 m MV A velocity: 78.10 cm/s  Systemic Diam: 2.00 cm MV E/A ratio:  0.94 Fransico Him MD Electronically signed by Fransico Him MD Signature Date/Time: 11/14/2021/12:16:13 PM    Final    X-ray chest PA and lateral  Result Date: 11/14/2021 CLINICAL DATA:  Encounter for hard plaque. EXAM: CHEST - 2 VIEW COMPARISON:  PA Lat 10/16/2021 FINDINGS: The heart size and mediastinal contours are within normal limits apart from aortic tortuosity with calcification in the arch. Both lungs are clear although less well inflated than previously. The visualized skeletal structures are intact with  mild lower  thoracic dextroscoliosis, mild osteopenia and thoracic spondylosis. A partially translucent electrical pad overlies the posterior lower left chest. There are multiple overlying monitor wires. IMPRESSION: No active cardiopulmonary disease. Aortic atherosclerosis and uncoiling. Osteopenia and degenerative change. Electronically Signed   By: Telford Nab M.D.   On: 11/14/2021 06:04   DG Chest 2 View  Result Date: 10/17/2021 CLINICAL DATA:  Paroxysmal atrial fibrillation. EXAM: CHEST - 2 VIEW COMPARISON:  11/30/2006.  CT, 02/03/2007. FINDINGS: Cardiac silhouette is normal in size and configuration. No mediastinal or hilar masses. No evidence of adenopathy. Clear lungs.  No pleural effusion or pneumothorax. Skeletal structures are intact. IMPRESSION: No active cardiopulmonary disease. Electronically Signed   By: Lajean Manes M.D.   On: 10/17/2021 15:40    EKG: on arrival shows sinus bradycardia at 47 bpm with RBBB (personally reviewed)  TELEMETRY: Sinus Loletha Grayer 40-60s with intermittent 2:1 AVB (personally reviewed)  Assessment/Plan: 1.  Symptomatic second degree AV block Without reversible cause identified. Continues intermittently despite diltiazem wash out Explained risks, benefits, and alternatives to PPM implantation, including but not limited to bleeding, infection, pneumothorax, pericardial effusion, lead dislodgement, heart attack, stroke, or death.  Pt verbalized understanding and agrees to proceed if indicated.   2. PAF Resume xarelto post pacing Can follow burden/rates with device to determine need to add back BB  3. H/o DVT/PE w/ Lupus anticoagulant Resume xarelto post pacing  Dr. Curt Bears has seen. Plan for pacing today pending census.   For questions or updates, please contact [place] Please consult [email or web address] for contact info under Cardiology/STEMI.  Signed, Shirley Friar, PA-C  11/15/2021 7:23 AM  I have seen and examined this patient with Oda Kilts.   Agree with above, note added to reflect my findings.  Patient presented to the hospital with episodes of fatigue.  He was found to be in 2-1 AV block.  He was initially on diltiazem, which was stopped.  Unfortunately 2-1 AV block has since continued.  GEN: Well nourished, well developed, in no acute distress  HEENT: normal  Neck: no JVD, carotid bruits, or masses Cardiac: RRR; no murmurs, rubs, or gallops,no edema  Respiratory:  clear to auscultation bilaterally, normal work of breathing GI: soft, nontender, nondistended, + BS MS: no deformity or atrophy  Skin: warm and dry Neuro:  Strength and sensation are intact Psych: euthymic mood, full affect   Second-degree AV block: Patient has intermittent second-degree AV block, at times during waking hours.  Due to that, pacemaker implant would be indicated.  Risk and benefits were discussed risk of bleeding, tamponade, infection, pneumothorax, lead dislodgment.  He understands these risks and is agreed to the procedure. Paroxysmal atrial fibrillation: Currently on Xarelto for anticoagulation.  We Nisaiah Bechtol hold due to pacemaker implant.  We Nadyne Gariepy need to restart as he has lupus anticoagulant.  Johnae Friley M. Ahmarion Saraceno MD 11/15/2021 12:53 PM

## 2021-11-16 ENCOUNTER — Inpatient Hospital Stay (HOSPITAL_COMMUNITY): Payer: Medicare PPO

## 2021-11-16 ENCOUNTER — Encounter (HOSPITAL_COMMUNITY): Payer: Self-pay | Admitting: Cardiology

## 2021-11-16 DIAGNOSIS — I441 Atrioventricular block, second degree: Secondary | ICD-10-CM | POA: Diagnosis not present

## 2021-11-16 LAB — GLUCOSE, CAPILLARY: Glucose-Capillary: 88 mg/dL (ref 70–99)

## 2021-11-16 MED ORDER — HYDROCORTISONE 1 % EX CREA: TOPICAL_CREAM | Freq: Three times a day (TID) | CUTANEOUS | 0 refills | Status: AC

## 2021-11-16 MED ORDER — HYDROCORTISONE 1 % EX CREA
TOPICAL_CREAM | Freq: Three times a day (TID) | CUTANEOUS | Status: DC
  Filled 2021-11-16: qty 28

## 2021-11-16 MED ORDER — ACETAMINOPHEN 325 MG PO TABS: 325.0000 mg | ORAL_TABLET | ORAL | Status: AC | PRN

## 2021-11-16 MED ORDER — RIVAROXABAN 15 MG PO TABS: 15.0000 mg | ORAL_TABLET | Freq: Every day | ORAL | 1 refills | Status: DC

## 2021-11-16 NOTE — Discharge Summary (Addendum)
ELECTROPHYSIOLOGY PROCEDURE DISCHARGE SUMMARY    Patient ID: [name],  MRN: [id number], DOB/AGE: [birth year] 86 y.o.  Admit date: 11/14/2021 Discharge date: 11/16/2021  Primary Care Physician: Prudence Davidson, MD  Primary Cardiologist: Verne Carrow, MD  Electrophysiologist: New to Dr. Elberta Fortis  Primary Discharge Diagnosis:  Second degree AV block bradycardia status post pacemaker implantation this admission  Secondary Discharge Diagnosis:  Paroxysmal atrial fibrillation Lupus anticoagulant with h/o DVT/PE  Allergies  Allergen Reactions   Tape Rash   Anti-Oxidant Other (See Comments)    GI upset   Centrum Other (See Comments)    GI upset   Latex Rash     Procedures This Admission:  1.  Implantation of a Medtronic Dual Chamber PPM on 11/15/2021 by Dr. Elberta Fortis. The patient received a Medtronic Azure XT DR [id number]  with a Medtronic CapSureFix Novus 5076 right atrial lead and a Medtronic SelectSure 3830 right ventricular lead. There were no immediate post procedure complications.   2.  CXR on 11/16/21 demonstrated no pneumothorax status post device implantation.    Brief HPI: [name] is a 88 y.o. male was admitted for symptomatic bradycardia and electrophysiology team asked to see for consideration of PPM implantation.  Past medical history includes above.  The patient has had AV block without reversible causes identified.  Risks, benefits, and alternatives to PPM implantation were reviewed with the patient who wished to proceed.   Hospital Course:  The patient was admitted and underwent implantation of a Medtronic dual chamber PPM with details as outlined above.  He was monitored on telemetry overnight which demonstrated appropriate pacing.  Left chest was without hematoma or ecchymosis.  The device was interrogated and found to be functioning normally.  CXR was obtained and demonstrated no pneumothorax status post device implantation.  Wound  care, arm mobility, and restrictions were reviewed with the patient.  The patient was examined and considered stable for discharge to home.    Anticoagulation resumption This patient should resume their Xarelto on Saturday November 17, 2021    Physical Exam: Vitals:   11/15/21 2221 11/16/21 0000 11/16/21 0444 11/16/21 0758  BP: 132/83 136/83 139/87 125/75  Pulse: 75 83 70 63  Resp: 14 14 17 14   Temp: 99.4 F (37.4 C) 99.1 F (37.3 C) 99.2 F (37.3 C) 98.1 F (36.7 C)  TempSrc: Oral Oral Oral Oral  SpO2: 95% 97% 96% 96%  Weight:   78.6 kg   Height:        GEN- The patient is well appearing, alert and oriented x 3 today.   HEENT: normocephalic, atraumatic; sclera clear, conjunctiva pink; hearing intact; oropharynx clear; neck supple, no JVP Lymph- no cervical lymphadenopathy Lungs- Clear to ausculation bilaterally, normal work of breathing.  No wheezes, rales, rhonchi Heart- Regular rate and rhythm, no murmurs, rubs or gallops, PMI not laterally displaced GI- soft, non-tender, non-distended, bowel sounds present, no hepatosplenomegaly Extremities- no clubbing, cyanosis, or edema; DP/PT/radial pulses 2+ bilaterally MS- no significant deformity or atrophy Skin- warm and dry, no rash or lesion, left chest without hematoma/ecchymosis Psych- euthymic mood, full affect Neuro- strength and sensation are intact   Labs:   Lab Results  Component Value Date   WBC 7.0 11/14/2021   HGB 13.5 11/14/2021   HCT 38.9 (L) 11/14/2021   MCV 96.3 11/14/2021   PLT 181 11/14/2021    Recent Labs  Lab 11/14/21 0538  NA 142  K 4.4  CL 111  CO2 23  BUN  19  CREATININE 1.56*  CALCIUM 8.8*  PROT 6.3*  BILITOT 0.8  ALKPHOS 46  ALT 19  AST 21  GLUCOSE 98    Discharge Medications:  Allergies as of 11/16/2021       Reactions   Tape Rash   Anti-oxidant Other (See Comments)   GI upset   Centrum Other (See Comments)   GI upset   Latex Rash        Medication List     TAKE these  medications    acetaminophen 325 MG tablet Commonly known as: TYLENOL Take 1-2 tablets (325-650 mg total) by mouth every 4 (four) hours as needed for mild pain.   alfuzosin 10 MG 24 hr tablet Commonly known as: UROXATRAL Take 10 mg by mouth daily with breakfast.   Ciclopirox 1 % shampoo Apply 1 Application topically See admin instructions. 2-3 times a week   diltiazem 120 MG 24 hr capsule Commonly known as: CARDIZEM CD Take 1 capsule (120 mg total) by mouth daily.   esomeprazole 20 MG capsule Commonly known as: NEXIUM Take 20 mg by mouth daily at 12 noon.   ezetimibe 10 MG tablet Commonly known as: Zetia Take 1 tablet (10 mg total) by mouth daily.   lubiprostone 8 MCG capsule Commonly known as: AMITIZA Take 8 mcg by mouth daily as needed for constipation.   Rivaroxaban 15 MG Tabs tablet Commonly known as: Xarelto Take 1 tablet (15 mg total) by mouth daily with supper. Start taking on: November 17, 2021 What changed: See the new instructions.   VITAMIN B-12 PO Take 1 tablet by mouth daily.   VITAMIN D-3 PO Take 1 capsule by mouth daily.        Disposition:    Follow-up Information     [facility] Follow up.   Why: on 7/26 at 920 for post pacemaker check Contact information: [address] [place] [address] [phone]                Duration of Discharge Encounter: Greater than 30 minutes including physician time.  Signed, Graciella Freer, PA-C  11/16/2021 10:19 AM   I have seen and examined this patient with Otilio Saber.  Agree with above, note added to reflect my findings.  On exam, RRR, no murmurs, lungs clear.  He is now status post Medtronic pacemaker for second degree AV block.  Device functioning appropriately.  Chest x-ray and interrogation without issue.  Plan for discharge today with follow-up in device clinic.  Numair Masden M. Dalena Plantz MD 11/16/2021 10:25 AM

## 2021-11-16 NOTE — Progress Notes (Signed)
Mobility Specialist: Progress Note   11/16/21 1148  Mobility  Activity Ambulated independently in hallway  Level of Assistance Independent  Assistive Device None  Distance Ambulated (ft) 470 ft  Activity Response Tolerated well  $Mobility charge 1 Mobility   Pre-Mobility: 79 HR, 98% SpO2 Post-Mobility: 78 HR, 133/91 (103) BP, 97% SpO2  Received pt in bed having no complaints and agreeable to mobility. Pt was asymptomatic throughout ambulation and returned to room w/o fault. Left sitting EOB w/ call bell in reach and all needs met.  [facility] Kailash Hinze Mobility Specialist Mobility Specialist [address]: [phone]

## 2021-11-16 NOTE — Care Management Important Message (Signed)
Important Message  Patient Details  Name: [name] MRN: [id number] Date of Birth: 03-Mar-1933   Medicare Important Message Given:  Yes     Renie Ora 11/16/2021, 9:21 AM

## 2021-11-16 NOTE — Discharge Instructions (Signed)
After Your Pacemaker   You have a Medtronic Pacemaker  ACTIVITY Do not lift your arm above shoulder height for 1 week after your procedure. After 7 days, you may progress as below.  You should remove your sling 24 hours after your procedure, unless otherwise instructed by your provider.     Friday November 23, 2021  Saturday November 24, 2021 Sunday November 25, 2021 Monday November 26, 2021   Do not lift, push, pull, or carry anything over 10 pounds with the affected arm until 6 weeks (Friday December 28, 2021 ) after your procedure.   You may drive AFTER your wound check, unless you have been told otherwise by your provider.   Ask your healthcare provider when you can go back to work   INCISION/Dressing If you are on a blood thinner such as Coumadin, Xarelto, Eliquis, Plavix, or Pradaxa please confirm with your provider when this should be resumed.   If large square, outer bandage is left in place, this can be removed after 24 hours from your procedure. Do not remove steri-strips or glue as below.   Monitor your Pacemaker site for redness, swelling, and drainage. Call the device clinic at [phone] if you experience these symptoms or fever/chills.  If your incision is sealed with Steri-strips or staples, you may shower 7 days after your procedure or when told by your provider. Do not remove the steri-strips or let the shower hit directly on your site. You may wash around your site with soap and water.    If you were discharged in a sling, please do not wear this during the day more than 48 hours after your surgery unless otherwise instructed. This may increase the risk of stiffness and soreness in your shoulder.   Avoid lotions, ointments, or perfumes over your incision until it is well-healed.  You may use a hot tub or a pool AFTER your wound check appointment if the incision is completely closed.  Pacemaker Alerts:  Some alerts are vibratory and others beep. These are NOT emergencies. Please  call our office to let us know. If this occurs at night or on weekends, it can wait until the next business day. Send a remote transmission.  If your device is capable of reading fluid status (for heart failure), you will be offered monthly monitoring to review this with you.   DEVICE MANAGEMENT Remote monitoring is used to monitor your pacemaker from home. This monitoring is scheduled every 91 days by our office. It allows [place] to keep an eye on the functioning of your device to ensure it is working properly. You will routinely see your Electrophysiologist annually (more often if necessary).   You should receive your ID card for your new device in 4-8 weeks. Keep this card with you at all times once received. Consider wearing a medical alert bracelet or necklace.  Your Pacemaker may be MRI compatible. This will be discussed at your next office visit/wound check.  You should avoid contact with strong electric or magnetic fields.   Do not use amateur (ham) radio equipment or electric (arc) welding torches. MP3 player headphones with magnets should not be used. Some devices are safe to use if held at least 12 inches (30 cm) from your Pacemaker. These include power tools, lawn mowers, and speakers. If you are unsure if something is safe to use, ask your health care provider.  When using your cell phone, hold it to the ear that is on the opposite side from the  Pacemaker. Do not leave your cell phone in a pocket over the Pacemaker.  You may safely use electric blankets, heating pads, computers, and microwave ovens.  Call the office right away if: You have chest pain. You feel more short of breath than you have felt before. You feel more light-headed than you have felt before. Your incision starts to open up.  This information is not intended to replace advice given to you by your health care provider. Make sure you discuss any questions you have with your health care provider.

## 2021-11-16 NOTE — Plan of Care (Signed)

## 2021-11-19 ENCOUNTER — Telehealth: Payer: Self-pay

## 2021-11-19 NOTE — Telephone Encounter (Signed)
Returned call to Pt.  Advised that during the time period he describes he went into atrial fibrillation.  Pt with known history of PAF on OAC.  Pt thanked nurse for call back.  States he is currently at the [place] seeing his PCP.  No further questions.

## 2021-11-19 NOTE — Telephone Encounter (Signed)
The patient states he had an episode last night around 9:30 pm or a little later. He felt lightheaded and sweaty. He has a transmission from today. I told him the nurse will review it and give him a call back.

## 2021-11-20 ENCOUNTER — Encounter: Payer: Self-pay | Admitting: Internal Medicine

## 2021-11-20 NOTE — Assessment & Plan Note (Signed)
Remote Plan- CXR

## 2021-11-20 NOTE — Assessment & Plan Note (Signed)
On Xarelto also for PAFib. No recurrence of PE

## 2021-11-20 NOTE — Assessment & Plan Note (Signed)
Currently clear and comfortABLE

## 2021-11-28 ENCOUNTER — Telehealth: Payer: Self-pay

## 2021-11-28 ENCOUNTER — Ambulatory Visit (INDEPENDENT_AMBULATORY_CARE_PROVIDER_SITE_OTHER): Payer: Medicare PPO

## 2021-11-28 DIAGNOSIS — I459 Conduction disorder, unspecified: Secondary | ICD-10-CM

## 2021-11-28 LAB — CUP PACEART INCLINIC DEVICE CHECK
Battery Remaining Longevity: 148 mo
Battery Voltage: 3.22 V
Brady Statistic AP VP Percent: 2.15 %
Brady Statistic AP VS Percent: 0.01 %
Brady Statistic AS VP Percent: 97.56 %
Brady Statistic AS VS Percent: 0.28 %
Brady Statistic RA Percent Paced: 2.11 %
Brady Statistic RV Percent Paced: 97.04 %
Date Time Interrogation Session: 20230726094400
Implantable Lead Implant Date: 20230713
Implantable Lead Implant Date: 20230713
Implantable Lead Location: 753859
Implantable Lead Location: 753860
Implantable Lead Model: 3830
Implantable Lead Model: 5076
Implantable Pulse Generator Implant Date: 20230713
Lead Channel Impedance Value: 323 Ohm
Lead Channel Impedance Value: 399 Ohm
Lead Channel Impedance Value: 475 Ohm
Lead Channel Impedance Value: 722 Ohm
Lead Channel Pacing Threshold Amplitude: 0.5 V
Lead Channel Pacing Threshold Amplitude: 0.625 V
Lead Channel Pacing Threshold Pulse Width: 0.4 ms
Lead Channel Pacing Threshold Pulse Width: 0.4 ms
Lead Channel Sensing Intrinsic Amplitude: 10.125 mV
Lead Channel Sensing Intrinsic Amplitude: 3.625 mV
Lead Channel Sensing Intrinsic Amplitude: 3.625 mV
Lead Channel Sensing Intrinsic Amplitude: 9.75 mV
Lead Channel Setting Pacing Amplitude: 3.5 V
Lead Channel Setting Pacing Amplitude: 3.5 V
Lead Channel Setting Pacing Pulse Width: 0.4 ms
Lead Channel Setting Sensing Sensitivity: 1.2 mV

## 2021-11-28 NOTE — Telephone Encounter (Signed)
Pt asked about taking Tylenol, if needed, while taking Flexeril. Advised to discuss w/ PCP, ordering provider. Patient verbalized understanding and agreeable to plan.

## 2021-11-28 NOTE — Progress Notes (Signed)
Wound check appointment. Steri-strips removed. Wound without redness or edema. Incision edges approximated, wound well healed. Normal device function. Thresholds, sensing, and impedances consistent with implant measurements. Device programmed at 3.5V/auto capture programmed on for extra safety margin until 3 month visit. Histogram distribution appropriate for patient and level of activity.  AT/AF Burden 2.8%, + Xarelto, No high ventricular rates noted. Patient educated about wound care, arm mobility, lifting restrictions. ROV in 3 months with implanting physician.

## 2021-11-28 NOTE — Telephone Encounter (Signed)
The patient LMOVM with questions about medications.

## 2021-11-28 NOTE — Patient Instructions (Signed)
   After Your Pacemaker   Monitor your pacemaker site for redness, swelling, and drainage. Call the device clinic at [phone] if you experience these symptoms or fever/chills.  Your incision was closed with Steri-strips or staples:  You may shower 7 days after your procedure and wash your incision with soap and water. Avoid lotions, ointments, or perfumes over your incision until it is well-healed.  You may use a hot tub or a pool after your wound check appointment if the incision is completely closed.  Do not lift, push or pull greater than 10 pounds with the affected arm until 6 weeks after your procedure. There are no other restrictions in arm movement after your wound check appointment. Until AFTER AUGUST 24TH  You may drive, unless driving has been restricted by your healthcare providers.  Your Pacemaker is MRI compatible.  Remote monitoring is used to monitor your pacemaker from home. This monitoring is scheduled every 91 days by our office. It allows [place] to keep an eye on the functioning of your device to ensure it is working properly. You will routinely see your Electrophysiologist annually (more often if necessary).

## 2021-11-29 ENCOUNTER — Telehealth: Payer: Self-pay

## 2021-11-29 NOTE — Telephone Encounter (Signed)
Called patient back this morning with no answer. LVM for patient to call back. Patient left a vm stating he has had the night sweats again and is concerned. Per Marliss Czar he needs to send a transmission to see if anything is seen on it.

## 2021-12-04 NOTE — Telephone Encounter (Signed)
I spoke with the patient and helped him send a manual transmission. Transmission received. I told him the nurse will review the transmission and give him a call back.

## 2021-12-04 NOTE — Telephone Encounter (Signed)
Transmission received and reviewed. Normal device function. No arrhythmias during time of complaints. Presenting rhythm AS/VP 84. Patient is advised to contact his PCP concerning night sweats. Patient appreciative of follow up.

## 2021-12-18 ENCOUNTER — Telehealth: Payer: Self-pay

## 2021-12-18 NOTE — Telephone Encounter (Signed)
Image obtained. Patient denies bleeding, drainage, fever or chills. Device Clinic apt made 12/20/21 @ 3:20 pm. Location, date and time discussed with patient. Voiced understanding.

## 2021-12-18 NOTE — Telephone Encounter (Signed)
Patient called in stating that he is concerned about the incision site. He states he thinks its from his seatbelt rubbing off on it. Patient is not in any pain and he will send a picture to email and I let him know a nurse will give him a call back to go over what to do next. Patient also states he will be in [place] at 245 for an appt with another doc and he will be able to come in today if he needs to

## 2021-12-20 ENCOUNTER — Ambulatory Visit: Payer: Medicare PPO

## 2021-12-20 DIAGNOSIS — I459 Conduction disorder, unspecified: Secondary | ICD-10-CM

## 2021-12-20 NOTE — Progress Notes (Signed)
Stich clipped from right lateral incision site.

## 2021-12-25 ENCOUNTER — Telehealth: Payer: Self-pay

## 2021-12-25 NOTE — Telephone Encounter (Signed)
The patient called to see if he has an August appointment. I let him know that  he do not have an August appointment. His next appointment is in October.

## 2022-02-15 ENCOUNTER — Ambulatory Visit (INDEPENDENT_AMBULATORY_CARE_PROVIDER_SITE_OTHER): Payer: Medicare PPO

## 2022-02-15 DIAGNOSIS — I459 Conduction disorder, unspecified: Secondary | ICD-10-CM

## 2022-02-15 LAB — CUP PACEART REMOTE DEVICE CHECK
Battery Remaining Longevity: 156 mo
Battery Voltage: 3.2 V
Brady Statistic AP VP Percent: 25.99 %
Brady Statistic AP VS Percent: 0.01 %
Brady Statistic AS VP Percent: 73.81 %
Brady Statistic AS VS Percent: 0.2 %
Brady Statistic RA Percent Paced: 26 %
Brady Statistic RV Percent Paced: 99.79 %
Date Time Interrogation Session: 20231012235046
Implantable Lead Implant Date: 20230713
Implantable Lead Implant Date: 20230713
Implantable Lead Location: 753859
Implantable Lead Location: 753860
Implantable Lead Model: 3830
Implantable Lead Model: 5076
Implantable Pulse Generator Implant Date: 20230713
Lead Channel Impedance Value: 323 Ohm
Lead Channel Impedance Value: 418 Ohm
Lead Channel Impedance Value: 513 Ohm
Lead Channel Impedance Value: 684 Ohm
Lead Channel Pacing Threshold Amplitude: 0.5 V
Lead Channel Pacing Threshold Amplitude: 0.625 V
Lead Channel Pacing Threshold Pulse Width: 0.4 ms
Lead Channel Pacing Threshold Pulse Width: 0.4 ms
Lead Channel Sensing Intrinsic Amplitude: 12.625 mV
Lead Channel Sensing Intrinsic Amplitude: 12.625 mV
Lead Channel Sensing Intrinsic Amplitude: 3 mV
Lead Channel Sensing Intrinsic Amplitude: 3 mV
Lead Channel Setting Pacing Amplitude: 1.5 V
Lead Channel Setting Pacing Amplitude: 2 V
Lead Channel Setting Pacing Pulse Width: 0.4 ms
Lead Channel Setting Sensing Sensitivity: 1.2 mV

## 2022-02-19 ENCOUNTER — Encounter: Payer: Self-pay | Admitting: Cardiology

## 2022-02-19 ENCOUNTER — Ambulatory Visit: Payer: Medicare PPO | Attending: Cardiology | Admitting: Cardiology

## 2022-02-19 VITALS — BP 108/74 | HR 69 | Ht 69.0 in | Wt 170.2 lb

## 2022-02-19 DIAGNOSIS — D6869 Other thrombophilia: Secondary | ICD-10-CM | POA: Diagnosis not present

## 2022-02-19 DIAGNOSIS — I441 Atrioventricular block, second degree: Secondary | ICD-10-CM

## 2022-02-19 DIAGNOSIS — I480 Paroxysmal atrial fibrillation: Secondary | ICD-10-CM | POA: Diagnosis not present

## 2022-02-19 NOTE — Progress Notes (Signed)
Electrophysiology Office Note   Date:  02/19/2022   ID:  Philipp, Schnitker Dec 13, 1932, MRN [id number]  PCP:  Debbra Riding, MD  Cardiologist:  Angelena Form Primary Electrophysiologist:  Scottlyn Mchaney Meredith Leeds, MD    Chief Complaint: Heart block   History of Present Illness: [name] is a 88 y.o. male who is being seen today for the evaluation of heart block at the request of Debbra Riding, MD. Presenting today for electrophysiology evaluation.  He has a history significant for CKD stage III,  hyperlipidemia, atrial fibrillation.  He presented to the hospital with symptomatic bradycardia.  He is status post Medtronic dual-chamber pacemaker implanted 11/15/2021.  Today, he denies symptoms of palpitations, chest pain, shortness of breath, orthopnea, PND, lower extremity edema, claudication, dizziness, presyncope, syncope, bleeding, or neurologic sequela. The patient is tolerating medications without difficulties.    Past Medical History:  Diagnosis Date   BPH (benign prostatic hyperplasia)    CKD (chronic kidney disease), stage III (HCC)    DVT (deep venous thrombosis) (HCC)    GERD (gastroesophageal reflux disease)    Hernia    Hyperlipidemia    Lung nodule    Mild aortic insufficiency    Mild dilation of ascending aorta (HCC)    PAF (paroxysmal atrial fibrillation) ([place])    Pulmonary embolism (HCC)    RBBB    Syncope    Past Surgical History:  Procedure Laterality Date   EYE SURGERY  1957   INGUINAL HERNIA REPAIR     x 3   Left leg trauma     LIPOMA RESECTION     PACEMAKER IMPLANT N/A 11/15/2021   Procedure: PACEMAKER IMPLANT;  Surgeon: Constance Haw, MD;  Location: [facility];  Service: Cardiovascular;  Laterality: N/A;     Current Outpatient Medications  Medication Sig Dispense Refill   acetaminophen (TYLENOL) 325 MG tablet Take 1-2 tablets (325-650 mg total) by mouth every 4 (four) hours as needed for mild pain.     alfuzosin  (UROXATRAL) 10 MG 24 hr tablet Take 10 mg by mouth daily with breakfast.     Cholecalciferol (VITAMIN D-3 PO) Take 1 capsule by mouth daily.     Cyanocobalamin (VITAMIN B-12 PO) Take 1 tablet by mouth daily.     diltiazem (CARDIZEM CD) 120 MG 24 hr capsule Take 1 capsule (120 mg total) by mouth daily. 90 capsule 3   esomeprazole (NEXIUM) 20 MG capsule Take 20 mg by mouth daily at 12 noon.     ezetimibe (ZETIA) 10 MG tablet Take 1 tablet (10 mg total) by mouth daily. 30 tablet 11   hydrocortisone cream 1 % Apply topically 3 (three) times daily. 30 g 0   lubiprostone (AMITIZA) 8 MCG capsule Take 8 mcg by mouth daily as needed for constipation.     Rivaroxaban (XARELTO) 15 MG TABS tablet Take 1 tablet (15 mg total) by mouth daily with supper. 90 tablet 1   No current facility-administered medications for this visit.    Allergies:   Tape, Anti-oxidant, Centrum, and Latex   Social History:  The patient  reports that he has never smoked. He has never used smokeless tobacco. He reports that he does not drink alcohol and does not use drugs.   Family History:  The patient's family history includes Cancer in his mother; Heart attack in his father; Heart failure in his father.    ROS:  Please see the history of present illness.   Otherwise, review of  systems is positive for none.   All other systems are reviewed and negative.    PHYSICAL EXAM: VS:  BP 108/74   Pulse 69   Ht 5\' 9"  (1.753 m)   Wt 170 lb 3.2 oz (77.2 kg)   SpO2 95%   BMI 25.13 kg/m  , BMI Body mass index is 25.13 kg/m. GEN: Well nourished, well developed, in no acute distress  HEENT: normal  Neck: no JVD, carotid bruits, or masses Cardiac: RRR; no murmurs, rubs, or gallops,no edema  Respiratory:  clear to auscultation bilaterally, normal work of breathing GI: soft, nontender, nondistended, + BS MS: no deformity or atrophy  Skin: warm and dry, device pocket is well healed Neuro:  Strength and sensation are intact Psych:  euthymic mood, full affect  EKG:  EKG is ordered today. Personal review of the ekg ordered shows sinus rhythm, rate 69  Device interrogation is reviewed today in detail.  See PaceArt for details.   Recent Labs: 11/14/2021: ALT 19; BUN 19; Creatinine, Ser 1.56; Hemoglobin 13.5; Magnesium 2.0; Platelets 181; Potassium 4.4; Sodium 142; TSH 3.757    Lipid Panel     Component Value Date/Time   CHOL  11/28/2006 0330    84        ATP III CLASSIFICATION:  <200     mg/dL   Desirable  200-239  mg/dL   Borderline High  >=240    mg/dL   High   TRIG 94 11/28/2006 0330   HDL 18 (L) 11/28/2006 0330   CHOLHDL 4.7 11/28/2006 0330   VLDL 19 11/28/2006 0330   LDLCALC  11/28/2006 0330    47        Total Cholesterol/HDL:CHD Risk Coronary Heart Disease Risk Table                     Men   Women  1/2 Average Risk   3.4   3.3     Wt Readings from Last 3 Encounters:  02/19/22 170 lb 3.2 oz (77.2 kg)  11/16/21 173 lb 4.5 oz (78.6 kg)  10/16/21 170 lb 12.8 oz (77.5 kg)      Other studies Reviewed: Additional studies/ records that were reviewed today include: TTE 11/14/21  Review of the above records today demonstrates:   1. Left ventricular ejection fraction, by estimation, is 55 to 60%. The  left ventricle has normal function. The left ventricle has no regional  wall motion abnormalities. Left ventricular diastolic parameters are  indeterminate.   2. Right ventricular systolic function is normal. The right ventricular  size is normal. There is normal pulmonary artery systolic pressure. The  estimated right ventricular systolic pressure is [id number] mmHg.   3. The mitral valve is degenerative. Mild mitral valve regurgitation. No  evidence of mitral stenosis.   4. The aortic valve is tricuspid. Aortic valve regurgitation is mild.  Aortic valve sclerosis/calcification is present, without any evidence of  aortic stenosis. Aortic regurgitation PHT measures 1572 msec. Aortic valve  Vmax measures  1.39 m/s.   5. Aortic dilatation noted. There is mild dilatation of the aortic root,  measuring 38 mm. There is mild dilatation of the ascending aorta,  measuring 39 mm.   6. The inferior vena cava is dilated in size with >50% respiratory  variability, suggesting right atrial pressure of 8 mmHg.    ASSESSMENT AND PLAN:  1.  Second-degree AV block: Status post Medtronic dual-chamber pacemaker implanted 11/15/2021.  Device functioning appropriately.  No changes at  this time.  2.  Paroxysmal atrial fibrillation: Currently on Xarelto 20 mg daily.  CHA2DS2-VASc of 3.  Minimal episodes noted on device interrogation.  3.  Secondary hypercoagulable state: Currently on Xarelto for atrial fibrillation as above.  Current medicines are reviewed at length with the patient today.   The patient does not have concerns regarding his medicines.  The following changes were made today:  none  Labs/ tests ordered today include:  Orders Placed This Encounter  Procedures   EKG 12-Lead     Disposition:   FU with Versie Fleener 1 year  Signed, Bronx Brogden Meredith Leeds, MD  02/19/2022 4:25 PM     [address] [place] [address] [phone] (office) [phone] (fax)

## 2022-02-19 NOTE — Patient Instructions (Signed)
Medication Instructions:  Your physician recommends that you continue on your current medications as directed. Please refer to the Current Medication list given to you today.  *If you need a refill on your cardiac medications before your next appointment, please call your pharmacy*   Lab Work: None ordered If you have labs (blood work) drawn today and your tests are completely normal, you will receive your results only by: [place] (if you have MyChart) OR A paper copy in the mail If you have any lab test that is abnormal or we need to change your treatment, we will call you to review the results.   Testing/Procedures: None ordered   Follow-Up: At [facility], you and your health needs are our priority.  As part of our continuing mission to provide you with exceptional heart care, we have created designated Provider Care Teams.  These Care Teams include your primary Cardiologist (physician) and Advanced Practice Providers (APPs -  Physician Assistants and Nurse Practitioners) who all work together to provide you with the care you need, when you need it.  Remote monitoring is used to monitor your Pacemaker or ICD from home. This monitoring reduces the number of office visits required to check your device to one time per year. It allows [place] to keep an eye on the functioning of your device to ensure it is working properly. You are scheduled for a device check from home on 05/17/2022. You may send your transmission at any time that day. If you have a wireless device, the transmission will be sent automatically. After your physician reviews your transmission, you will receive a postcard with your next transmission date.  Your next appointment:   1 year(s)  The format for your next appointment:   In Person  Provider:   Allegra Lai, MD    Thank you for choosing [facility]!!   Trinidad Curet, RN [phone]    Other Instructions   Important Information About  [place]

## 2022-02-20 LAB — CUP PACEART INCLINIC DEVICE CHECK
Battery Remaining Longevity: 154 mo
Battery Voltage: 3.2 V
Brady Statistic AP VP Percent: 23.17 %
Brady Statistic AP VS Percent: 0.01 %
Brady Statistic AS VP Percent: 76.58 %
Brady Statistic AS VS Percent: 0.24 %
Brady Statistic RA Percent Paced: 23.17 %
Brady Statistic RV Percent Paced: 99.75 %
Date Time Interrogation Session: 20231017155400
Implantable Lead Implant Date: 20230713
Implantable Lead Implant Date: 20230713
Implantable Lead Location: 753859
Implantable Lead Location: 753860
Implantable Lead Model: 3830
Implantable Lead Model: 5076
Implantable Pulse Generator Implant Date: 20230713
Lead Channel Impedance Value: 323 Ohm
Lead Channel Impedance Value: 418 Ohm
Lead Channel Impedance Value: 570 Ohm
Lead Channel Impedance Value: 627 Ohm
Lead Channel Pacing Threshold Amplitude: 0.5 V
Lead Channel Pacing Threshold Amplitude: 0.625 V
Lead Channel Pacing Threshold Amplitude: 0.625 V
Lead Channel Pacing Threshold Amplitude: 0.75 V
Lead Channel Pacing Threshold Pulse Width: 0.4 ms
Lead Channel Pacing Threshold Pulse Width: 0.4 ms
Lead Channel Pacing Threshold Pulse Width: 0.4 ms
Lead Channel Pacing Threshold Pulse Width: 0.4 ms
Lead Channel Sensing Intrinsic Amplitude: 10.375 mV
Lead Channel Sensing Intrinsic Amplitude: 3.375 mV
Lead Channel Sensing Intrinsic Amplitude: 3.875 mV
Lead Channel Sensing Intrinsic Amplitude: 8.875 mV
Lead Channel Setting Pacing Amplitude: 1.5 V
Lead Channel Setting Pacing Amplitude: 2 V
Lead Channel Setting Pacing Pulse Width: 0.4 ms
Lead Channel Setting Sensing Sensitivity: 1.2 mV

## 2022-02-20 NOTE — Progress Notes (Signed)
Remote pacemaker transmission.   

## 2022-02-22 ENCOUNTER — Encounter: Payer: Medicare PPO | Admitting: Cardiology

## 2022-03-06 ENCOUNTER — Telehealth: Payer: Self-pay

## 2022-03-06 NOTE — Telephone Encounter (Signed)
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 Reports intermittent aching pain below pacemaker. Denies dizziness, lightheadedness, or shortness of breath. Reports area is tender to touch. Advised patient to follow up with PCP in regard to complaints. Remote transmission reviewed and shows normal device function. No episodes noted. Patient stated he wants to call Dr. Camillia Herter nurse to speak with about symptoms.

## 2022-03-19 ENCOUNTER — Telehealth: Payer: Self-pay

## 2022-03-19 NOTE — Telephone Encounter (Signed)
The patient states he when he is sitting he feels his fingers tingling. He wants to know if that is normal for a pacemaker? I asked Boneta Lucks and she states no. That is not a pacemaker issue to call his primary care doctor.

## 2022-04-05 ENCOUNTER — Other Ambulatory Visit: Payer: Self-pay

## 2022-04-05 MED ORDER — DILTIAZEM HCL ER COATED BEADS 120 MG PO CP24: 120.0000 mg | ORAL_CAPSULE | Freq: Every day | ORAL | 0 refills | Status: DC

## 2022-04-23 NOTE — Progress Notes (Unsigned)
No chief complaint on file.   History of Present Illness: 88 yo male with history of mild aortic valve insufficiency, paroxysmal atrial fibrillation, symptomatic bradycardia s/p pacemaker placement in July 2023, prior PE, hyperlipidemia, vasovagal syncope, thoracic aortic aneurysm, RBBB and chronic kidney disease stage 3 who is here today for cardiac follow up. He has a history of unprovoked pulmonary emboli diagnosed in July of 2008 and was on coumadin until this was stopped in August 2015 by Hematology. He is not known to have CAD. Nuclear stress test without ischemia December 2017. Echo January 2018 with normal LV systolic function, mild AI, trivial MR. He has passed out easily his entire life and passes out after any procedure. 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 He was seen by Dr. Tomi Likens with Neurology and also evaluated by Neurosurgery. No further workup planned. Dizziness felt to be due to vertigo. He was diagnosed with atrial fibrillation 07/11/16 at [facility] following episode of neck pain and dizziness. He converted to sinus in their ED. He was started on Cardizem and Xarelto. He was found to have brief runs of atrial fib on cardiac monitor April 2018 so he was continued on Xarelto. He was admitted to [facility] in July 2023 with symptomatic bradycardia and a permanent pacemaker was placed. Echo July 2023 with LVEF=55-60%, mild mitral regurgitation, mild aortic insufficiency.   He is here today for follow up. The patient denies any chest pain, dyspnea, palpitations, lower extremity edema, orthopnea, PND, dizziness, near syncope or syncope.   Primary Care Physician: Debbra Riding, MD (New Dr. Evaristo Bury)  Past Medical History:  Diagnosis Date   BPH (benign prostatic hyperplasia)    CKD (chronic kidney disease), stage III (HCC)    DVT (deep venous  thrombosis) (HCC)    GERD (gastroesophageal reflux disease)    Hernia    Hyperlipidemia    Lung nodule    Mild aortic insufficiency    Mild dilation of ascending aorta (HCC)    PAF (paroxysmal atrial fibrillation) ([place])    Pulmonary embolism (HCC)    RBBB    Syncope     Past Surgical History:  Procedure Laterality Date   EYE SURGERY  1957   INGUINAL HERNIA REPAIR     x 3   Left leg trauma     LIPOMA RESECTION     PACEMAKER IMPLANT N/A 11/15/2021   Procedure: PACEMAKER IMPLANT;  Surgeon: Constance Haw, MD;  Location: [facility];  Service: Cardiovascular;  Laterality: N/A;    Current Outpatient Medications  Medication Sig Dispense Refill   acetaminophen (TYLENOL) 325 MG tablet Take 1-2 tablets (325-650 mg total) by mouth every 4 (four) hours as needed for mild pain.     alfuzosin (UROXATRAL) 10 MG 24 hr tablet Take 10 mg by mouth daily with breakfast.     Cholecalciferol (VITAMIN D-3 PO) Take 1 capsule by mouth daily.     Cyanocobalamin (VITAMIN B-12 PO) Take 1 tablet by mouth daily.     diltiazem (CARDIZEM CD) 120 MG 24 hr capsule Take 1 capsule (120 mg total) by mouth daily. 90 capsule 0   esomeprazole (NEXIUM) 20 MG capsule Take 20 mg by mouth daily at 12 noon.     ezetimibe (ZETIA) 10 MG tablet Take 1 tablet (10 mg total) by mouth daily. 30 tablet 11  hydrocortisone cream 1 % Apply topically 3 (three) times daily. 30 g 0   lubiprostone (AMITIZA) 8 MCG capsule Take 8 mcg by mouth daily as needed for constipation.     Rivaroxaban (XARELTO) 15 MG TABS tablet Take 1 tablet (15 mg total) by mouth daily with supper. 90 tablet 1   No current facility-administered medications for this visit.    Allergies  Allergen Reactions   Tape Rash   Anti-Oxidant Other (See Comments)    GI upset   Centrum Other (See Comments)    GI upset   Latex Rash    Social History   Socioeconomic History   Marital status: Married    Spouse name: Not on file   Number of children:  2   Years of education: Not on file   Highest education level: Not on file  Occupational History   Occupation: Horticulturist, commercial for [employer], educational  Tobacco Use   Smoking status: Never   Smokeless tobacco: Never  Vaping Use   Vaping Use: Never used  Substance and Sexual Activity   Alcohol use: No   Drug use: No   Sexual activity: Not on file  Other Topics Concern   Not on file  Social History Narrative   Right Handed    Lives in a one story home   Drinks Caffeine    Social Determinants of Health   Financial Resource Strain: Not on file  Food Insecurity: Not on file  Transportation Needs: Not on file  Physical Activity: Not on file  Stress: Not on file  Social Connections: Not on file  Intimate Partner Violence: Not on file    Family History  Problem Relation Age of Onset   Heart failure Father    Heart attack Father        x2   Cancer Mother        of eye-causing her to lose her eye    Review of Systems:  As stated in the HPI and otherwise negative.   There were no vitals taken for this visit.  Physical Examination:   General: Well developed, well nourished, NAD  HEENT: OP clear, mucus membranes moist  SKIN: warm, dry. No rashes. Neuro: No focal deficits  Musculoskeletal: Muscle strength 5/5 all ext  Psychiatric: Mood and affect normal  Neck: No JVD, no carotid bruits, no thyromegaly, no lymphadenopathy.  Lungs:Clear bilaterally, no wheezes, rhonci, crackles Cardiovascular: Regular rate and rhythm. No murmurs, gallops or rubs. Abdomen:Soft. Bowel sounds present. Non-tender.  Extremities: No lower extremity edema. Pulses are 2 + in the bilateral DP/PT.  Echo July 2023:   1. Left ventricular ejection fraction, by estimation, is 55 to 60%. The  left ventricle has normal function. The left ventricle has no regional  wall motion abnormalities. Left ventricular diastolic parameters are  indeterminate.   2. Right ventricular systolic function is  normal. The right ventricular  size is normal. There is normal pulmonary artery systolic pressure. The  estimated right ventricular systolic pressure is 29.2 mmHg.   3. The mitral valve is degenerative. Mild mitral valve regurgitation. No  evidence of mitral stenosis.   4. The aortic valve is tricuspid. Aortic valve regurgitation is mild.  Aortic valve sclerosis/calcification is present, without any evidence of  aortic stenosis. Aortic regurgitation PHT measures 1572 msec. Aortic valve  Vmax measures 1.39 m/s.   5. Aortic dilatation noted. There is mild dilatation of the aortic root,  measuring 38 mm. There is mild dilatation of the ascending aorta,  measuring 39 mm.   6. The inferior vena cava is dilated in size with >50% respiratory  variability, suggesting right atrial pressure of 8 mmHg.   Nuclear stress test December 2017: Nuclear stress EF: 55%. Blood pressure demonstrated a normal response to exercise. There was no ST segment deviation noted during stress. The study is normal. This is a low risk study. The left ventricular ejection fraction is normal (55-65%). The patient was asymptomatic during the stress test.   Normal exercise nuclear stress test with no evidence of prior infarct or ischemia.  Good exercise capacity. Normal BP response to stress.   EKG:  EKG is *** ordered today. The EKG demonstrates   Recent Labs: 11/14/2021: ALT 19; BUN 19; Creatinine, Ser 1.56; Hemoglobin 13.5; Magnesium 2.0; Platelets 181; Potassium 4.4; Sodium 142; TSH 3.757    Wt Readings from Last 3 Encounters:  02/19/22 170 lb 3.2 oz (77.2 kg)  11/16/21 173 lb 4.5 oz (78.6 kg)  10/16/21 170 lb 12.8 oz (77.5 kg)    Assessment and Plan:   1.  Aortic regurgitation: Mild by echo in July 2023.    2. Symptomatic bradycardia: Permanent pacemaker placed in July 2023 and followed in EP clinic by Dr. Raul Del.   3. Atrial fibrillation, paroxysmal: He is in sinus today on exam. Continue Cardizem and  Xarelto.     4. Hyperlipidemia: Followed by primary care. Lipids controlled per pt. Continue Zetia.    5. Thoracic aortic aneurysm: Mild by echo in July 2023.    Labs/ tests ordered today include:  No orders of the defined types were placed in this encounter.  Disposition:   F/U with me in 12 months  Signed, Verne Carrow, MD 04/23/2022 9:11 AM    [facility] [address] [place], [place], [place]  [address] Phone: [phone]; Fax: [phone]

## 2022-04-24 ENCOUNTER — Ambulatory Visit: Payer: Medicare PPO | Attending: Cardiovascular Disease | Admitting: Cardiovascular Disease

## 2022-04-24 ENCOUNTER — Encounter: Payer: Self-pay | Admitting: Cardiovascular Disease

## 2022-04-24 VITALS — BP 106/64 | HR 63 | Ht 69.0 in | Wt 174.4 lb

## 2022-04-24 DIAGNOSIS — I351 Nonrheumatic aortic (valve) insufficiency: Secondary | ICD-10-CM

## 2022-04-24 DIAGNOSIS — I480 Paroxysmal atrial fibrillation: Secondary | ICD-10-CM

## 2022-04-24 DIAGNOSIS — E7800 Pure hypercholesterolemia, unspecified: Secondary | ICD-10-CM | POA: Diagnosis not present

## 2022-04-24 DIAGNOSIS — R001 Bradycardia, unspecified: Secondary | ICD-10-CM

## 2022-04-24 NOTE — Patient Instructions (Signed)
Medication Instructions:  Your physician recommends that you continue on your current medications as directed. Please refer to the Current Medication list given to you today.  *If you need a refill on your cardiac medications before your next appointment, please call your pharmacy*   Lab Work: None. If you have labs (blood work) drawn today and your tests are completely normal, you will receive your results only by: MyChart Message (if you have MyChart) OR A paper copy in the mail If you have any lab test that is abnormal or we need to change your treatment, we will call you to review the results.   Testing/Procedures: None.   Follow-Up: At [facility], you and your health needs are our priority.  As part of our continuing mission to provide you with exceptional heart care, we have created designated Provider Care Teams.  These Care Teams include your primary Cardiologist (physician) and Advanced Practice Providers (APPs -  Physician Assistants and Nurse Practitioners) who all work together to provide you with the care you need, when you need it.  Your next appointment:   1 year(s)  The format for your next appointment:   In Person  Provider:   Christopher McAlhany, MD      Important Information About Sugar       

## 2022-04-30 ENCOUNTER — Other Ambulatory Visit: Payer: Self-pay

## 2022-04-30 MED ORDER — RIVAROXABAN 15 MG PO TABS: 15.0000 mg | ORAL_TABLET | Freq: Every day | ORAL | 1 refills | Status: DC

## 2022-04-30 NOTE — Telephone Encounter (Signed)
Prescription refill request for Xarelto received.  Indication:afib Last office visit:12/23 Weight:79.1  kg Age:88 Scr:1.5 CrCl:38.82  ml/min  Prescription refilled

## 2022-05-17 ENCOUNTER — Telehealth: Payer: Self-pay

## 2022-05-17 ENCOUNTER — Ambulatory Visit (INDEPENDENT_AMBULATORY_CARE_PROVIDER_SITE_OTHER): Payer: Medicare PPO

## 2022-05-17 DIAGNOSIS — I441 Atrioventricular block, second degree: Secondary | ICD-10-CM

## 2022-05-17 NOTE — Telephone Encounter (Signed)
Pt called tech support and his app is now working. Transmission received 05-17-2022

## 2022-05-17 NOTE — Telephone Encounter (Signed)
See my previous phone note.

## 2022-05-17 NOTE — Telephone Encounter (Signed)
The patient is going to call back to get help with [employer] app.

## 2022-05-20 LAB — CUP PACEART REMOTE DEVICE CHECK
Battery Remaining Longevity: 152 mo
Battery Voltage: 3.17 V
Brady Statistic AP VP Percent: 24.1 %
Brady Statistic AP VS Percent: 0.01 %
Brady Statistic AS VP Percent: 75.66 %
Brady Statistic AS VS Percent: 0.23 %
Brady Statistic RA Percent Paced: 24.09 %
Brady Statistic RV Percent Paced: 99.77 %
Date Time Interrogation Session: 20240112154230
Implantable Lead Connection Status: 753985
Implantable Lead Connection Status: 753985
Implantable Lead Implant Date: 20230713
Implantable Lead Implant Date: 20230713
Implantable Lead Location: 753859
Implantable Lead Location: 753860
Implantable Lead Model: 3830
Implantable Lead Model: 5076
Implantable Pulse Generator Implant Date: 20230713
Lead Channel Impedance Value: 342 Ohm
Lead Channel Impedance Value: 399 Ohm
Lead Channel Impedance Value: 475 Ohm
Lead Channel Impedance Value: 684 Ohm
Lead Channel Pacing Threshold Amplitude: 0.5 V
Lead Channel Pacing Threshold Amplitude: 0.75 V
Lead Channel Pacing Threshold Pulse Width: 0.4 ms
Lead Channel Pacing Threshold Pulse Width: 0.4 ms
Lead Channel Sensing Intrinsic Amplitude: 3.875 mV
Lead Channel Sensing Intrinsic Amplitude: 3.875 mV
Lead Channel Sensing Intrinsic Amplitude: 9.5 mV
Lead Channel Sensing Intrinsic Amplitude: 9.5 mV
Lead Channel Setting Pacing Amplitude: 1.5 V
Lead Channel Setting Pacing Amplitude: 2 V
Lead Channel Setting Pacing Pulse Width: 0.4 ms
Lead Channel Setting Sensing Sensitivity: 1.2 mV
Zone Setting Status: 755011

## 2022-06-03 NOTE — Progress Notes (Signed)
Remote pacemaker transmission.   

## 2022-07-23 ENCOUNTER — Telehealth: Payer: Self-pay

## 2022-07-23 NOTE — Telephone Encounter (Signed)
Pt got a message from [employer] that his app is not working. He is going to try later and maybe call [employer].

## 2022-07-24 NOTE — Telephone Encounter (Signed)
I called to follow up with the patient to see if he called [employer] about his app. LMOVM for patient to call the device clinic.

## 2022-07-29 NOTE — Telephone Encounter (Signed)
I spoke with the patient and he states he was able to get the app running.

## 2022-08-16 ENCOUNTER — Ambulatory Visit (INDEPENDENT_AMBULATORY_CARE_PROVIDER_SITE_OTHER): Payer: Medicare PPO

## 2022-08-16 DIAGNOSIS — I441 Atrioventricular block, second degree: Secondary | ICD-10-CM | POA: Diagnosis not present

## 2022-08-16 LAB — CUP PACEART REMOTE DEVICE CHECK
Battery Remaining Longevity: 147 mo
Battery Voltage: 3.12 V
Brady Statistic AP VP Percent: 31.15 %
Brady Statistic AP VS Percent: 0.02 %
Brady Statistic AS VP Percent: 66.58 %
Brady Statistic AS VS Percent: 2.25 %
Brady Statistic RA Percent Paced: 31.34 %
Brady Statistic RV Percent Paced: 97.61 %
Date Time Interrogation Session: 20240411212947
Implantable Lead Connection Status: 753985
Implantable Lead Connection Status: 753985
Implantable Lead Implant Date: 20230713
Implantable Lead Implant Date: 20230713
Implantable Lead Location: 753859
Implantable Lead Location: 753860
Implantable Lead Model: 3830
Implantable Lead Model: 5076
Implantable Pulse Generator Implant Date: 20230713
Lead Channel Impedance Value: 342 Ohm
Lead Channel Impedance Value: 399 Ohm
Lead Channel Impedance Value: 475 Ohm
Lead Channel Impedance Value: 627 Ohm
Lead Channel Pacing Threshold Amplitude: 0.625 V
Lead Channel Pacing Threshold Amplitude: 0.625 V
Lead Channel Pacing Threshold Pulse Width: 0.4 ms
Lead Channel Pacing Threshold Pulse Width: 0.4 ms
Lead Channel Sensing Intrinsic Amplitude: 3.75 mV
Lead Channel Sensing Intrinsic Amplitude: 3.75 mV
Lead Channel Sensing Intrinsic Amplitude: 9.75 mV
Lead Channel Sensing Intrinsic Amplitude: 9.75 mV
Lead Channel Setting Pacing Amplitude: 1.5 V
Lead Channel Setting Pacing Amplitude: 2 V
Lead Channel Setting Pacing Pulse Width: 0.4 ms
Lead Channel Setting Sensing Sensitivity: 1.2 mV
Zone Setting Status: 755011

## 2022-09-06 ENCOUNTER — Other Ambulatory Visit: Payer: Self-pay | Admitting: Cardiovascular Disease

## 2022-09-18 NOTE — Progress Notes (Signed)
Remote pacemaker transmission.   

## 2022-10-15 NOTE — Progress Notes (Signed)
Patient ID: [name], male    DOB: Feb 15, 1933, 89 y.o.   MRN: [id number]  HPI 75 yoM never smoker with past hx of DVT/ PE( 2013, Lupus anticoagulant) and bronchitis, complicated by hx of lung nodule, GERD, vagalinstability, PAFib,  FENO- 03/13/16- 25- borderline for allergic asthma , possibly normal. ----------------------------------------------------------------------------------------------------   10/16/21- 89 year old male never smoker with past history DVT/PE/Xarelto( 2013, +Lupus aticoagulant), Bronchitis, complicated by history lung nodule, GERD, vagal instability, CKD 3, PAFib, Gr1DD, Degenerative Disc Disease,  Covide vax- 4 Moderna Flu vax-  -----Patient feels like he is doing good, no concerns Follows here after remote PE and chooses to continue. Now on Xarelto for PAfib. Occasional bronchitis.  Does long drives in his RV.  10/17/22- 89 year old male never smoker with past history DVT/PE/Xarelto( 2013, +Lupus aticoagulant), Bronchitis, complicated by history lung nodule, GERD, vagal instability, CKD 3, PAFib, 2deg AVB/ Pacemaker, Gr1DD, Degenerative Disc Disease,  -----Patient doing well-states he got a pacemaker last August He continues Xarelto long-term for history of PE and A-fib.  No significant bleeding.  Cardiology follows. No acute respiratory events.  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 CXR 11/16/21- IMPRESSION: DPual lead left chest pacing system with atrial and ventricular wire insertions. No acute chest findings.  No pneumothorax.  Mild cardiomegaly.  ROS-see HPI  + = positive Constitutional:   No-   weight loss, night sweats, fevers, chills, fatigue, lassitude. No bleeding HEENT:   No-  headaches, difficulty swallowing, tooth/dental problems, sore throat,       No-  sneezing, itching, ear ache, + nasal congestion, post nasal drip,  CV:  No-   chest pain, orthopnea, PND, swelling in lower extremities, anasarca, dizziness, palpitations Resp: No-    shortness of breath with exertion or at rest.              No-   productive cough,  No non-productive cough,  No- coughing up of blood.              No-   change in color of mucus.  No- wheezing.   Skin: No-   rash or lesions. GI:  No-   heartburn, indigestion, abdominal pain, nausea, vomiting,  GU: MS:  No-   joint pain or swelling.  Neuro-     ? Vagal instability- eval by his other physicians. Psych:  No- change in mood or affect. No depression or anxiety.  No memory loss.  OBJ- Physical Exam   BP soft but he is comfortable General- Alert, Oriented, Affect-appropriate, Distress- none acute. Looks well Skin- rash-none, lesions- none, excoriation- none Lymphadenopathy- none Head- atraumatic            Eyes- Gross vision intact, PERRLA, conjunctivae and secretions clear            Ears- Hearing, canals-normal            Nose- Clear, +Septal dev and dev external nose,  No-mucus, polyps, erosion, perforation             Throat- Mallampati II , mucosa clear , drainage- none, tonsils- atrophic Neck- flexible , trachea midline, no stridor , thyroid nl, carotid no bruit Chest - symmetrical excursion , unlabored           Heart/CV- RRR , no murmur , no gallop  , no rub, nl s1 s2                           -  JVD- none , edema- none, stasis changes- none, varices- none           Lung- clear,  wheeze-none, cough- none , dullness-none, rub- none           Chest wall-  Abd-  Br/ Gen/ Rectal- Not done, not indicated Extrem- cyanosis- none, clubbing, none, atrophy- none, strength- nl Neuro- grossly intact to observation

## 2022-10-17 ENCOUNTER — Encounter: Payer: Self-pay | Admitting: Internal Medicine

## 2022-10-17 ENCOUNTER — Ambulatory Visit: Payer: Medicare PPO | Admitting: Internal Medicine

## 2022-10-17 VITALS — BP 116/62 | HR 70 | Ht 70.0 in | Wt 171.6 lb

## 2022-10-17 DIAGNOSIS — R911 Solitary pulmonary nodule: Secondary | ICD-10-CM | POA: Diagnosis not present

## 2022-10-17 DIAGNOSIS — Z7901 Long term (current) use of anticoagulants: Secondary | ICD-10-CM

## 2022-10-17 DIAGNOSIS — I2699 Other pulmonary embolism without acute cor pulmonale: Secondary | ICD-10-CM

## 2022-10-17 NOTE — Patient Instructions (Signed)
Ok to continue Xarelto  We discussed protecting your arms from bruising with long sleeves, such as a long-sleeved T-shirt when needed  Please call if we can help

## 2022-10-29 ENCOUNTER — Telehealth: Payer: Self-pay | Admitting: Cardiovascular Disease

## 2022-10-29 DIAGNOSIS — I480 Paroxysmal atrial fibrillation: Secondary | ICD-10-CM

## 2022-10-29 MED ORDER — RIVAROXABAN 15 MG PO TABS: 15.0000 mg | ORAL_TABLET | Freq: Every day | ORAL | 1 refills | Status: DC

## 2022-10-29 NOTE — Telephone Encounter (Signed)
Prescription refill request for Xarelto received.  Indication: AFIB  Last office visit: 04/24/22 Clifton James)  Weight: 77.8kg Age: 89 Scr: 1.56 (11/14/21)  CrCl: 36.37ml/min  Appropriate dose. Refill sent.

## 2022-10-29 NOTE — Telephone Encounter (Signed)
*  STAT* If patient is at the pharmacy, call can be transferred to refill team.   1. Which medications need to be refilled? (please list name of each medication and dose if known) Rivaroxaban (XARELTO) 15 MG TABS tablet   2. Which pharmacy/location (including street and city if local pharmacy) is medication to be sent to? [id number] [place], [place] - [address]   3. Do they need a 30 day or 90 day supply? 90   Patient is completely out of this medication and would like to pick this up asap.

## 2022-11-15 ENCOUNTER — Ambulatory Visit (INDEPENDENT_AMBULATORY_CARE_PROVIDER_SITE_OTHER): Payer: Medicare PPO

## 2022-11-15 DIAGNOSIS — I441 Atrioventricular block, second degree: Secondary | ICD-10-CM | POA: Diagnosis not present

## 2022-11-15 LAB — CUP PACEART REMOTE DEVICE CHECK
Battery Remaining Longevity: 144 mo
Battery Voltage: 3.07 V
Brady Statistic AP VP Percent: 30.67 %
Brady Statistic AP VS Percent: 0 %
Brady Statistic AS VP Percent: 68.86 %
Brady Statistic AS VS Percent: 0.47 %
Brady Statistic RA Percent Paced: 30.7 %
Brady Statistic RV Percent Paced: 99.52 %
Date Time Interrogation Session: 20240712005221
Implantable Lead Connection Status: 753985
Implantable Lead Connection Status: 753985
Implantable Lead Implant Date: 20230713
Implantable Lead Implant Date: 20230713
Implantable Lead Location: 753859
Implantable Lead Location: 753860
Implantable Lead Model: 3830
Implantable Lead Model: 5076
Implantable Pulse Generator Implant Date: 20230713
Lead Channel Impedance Value: 323 Ohm
Lead Channel Impedance Value: 380 Ohm
Lead Channel Impedance Value: 456 Ohm
Lead Channel Impedance Value: 627 Ohm
Lead Channel Pacing Threshold Amplitude: 0.5 V
Lead Channel Pacing Threshold Amplitude: 0.625 V
Lead Channel Pacing Threshold Pulse Width: 0.4 ms
Lead Channel Pacing Threshold Pulse Width: 0.4 ms
Lead Channel Sensing Intrinsic Amplitude: 3.125 mV
Lead Channel Sensing Intrinsic Amplitude: 3.125 mV
Lead Channel Sensing Intrinsic Amplitude: 8.75 mV
Lead Channel Sensing Intrinsic Amplitude: 8.75 mV
Lead Channel Setting Pacing Amplitude: 1.5 V
Lead Channel Setting Pacing Amplitude: 2 V
Lead Channel Setting Pacing Pulse Width: 0.4 ms
Lead Channel Setting Sensing Sensitivity: 1.2 mV
Zone Setting Status: 755011

## 2022-11-20 NOTE — Assessment & Plan Note (Signed)
He is under good care from his cardiologist.  I suggested long sleeves to protect forearm skin from ecchymoses.

## 2022-11-20 NOTE — Assessment & Plan Note (Signed)
CXR has been stable showing no active lesion of concern.

## 2022-11-29 NOTE — Progress Notes (Signed)
Remote pacemaker transmission.   

## 2022-12-02 ENCOUNTER — Telehealth: Payer: Self-pay

## 2022-12-02 NOTE — Telephone Encounter (Signed)
Patient would like for a nurse who works w/ Dr. Clifton James who can talk to patient about a referral.

## 2022-12-02 NOTE — Telephone Encounter (Signed)
Returned patients call.   Patient wanted to know results. Advised patient his transmission shows normal device function. Patient appreciative of call back.

## 2022-12-02 NOTE — Telephone Encounter (Signed)
Pt left a voicemail stating he would like the nurse to call him about his last remote check.

## 2022-12-03 ENCOUNTER — Encounter: Payer: Self-pay | Admitting: Cardiovascular Disease

## 2022-12-03 NOTE — Telephone Encounter (Signed)
Returned call to patient.  He and his wife would like GI doctors in [facility] system and also he may want to transfer care from his general surgeon in [place] re: a hernia.  Provided information for [facility] and [facility].

## 2022-12-03 NOTE — Telephone Encounter (Signed)
Lendon Ka   12/03/22  9:53 AM Note Returned call to patient.  He and his wife would like GI doctors in [facility] system and also he may want to transfer care from his general surgeon in [place] re: a hernia.  Provided information for [facility] and [facility].

## 2022-12-06 ENCOUNTER — Telehealth: Payer: Self-pay | Admitting: Internal Medicine

## 2022-12-06 NOTE — Telephone Encounter (Signed)
Good afternoon Dr. Rhea Belton  The following patient is requesting you as the doctor to establish GI care. A previous digestive health patient, he wishes to not go back to them because he is not getting the care he needs. The new doctor is not as good as the one that retired. Records are available on Epic. Please review and advise of scheduling.

## 2022-12-09 NOTE — Telephone Encounter (Signed)
Request received to transfer GI care from outside practice to [facility].  We appreciate the interest in our practice, however at this time due to high demand from patients without established GI providers we cannot accommodate this transfer.      

## 2022-12-13 NOTE — Telephone Encounter (Signed)
Advised PT of Dr. Lauro Franklin recommendation.

## 2023-02-14 ENCOUNTER — Ambulatory Visit (INDEPENDENT_AMBULATORY_CARE_PROVIDER_SITE_OTHER): Payer: Medicare PPO

## 2023-02-14 DIAGNOSIS — I441 Atrioventricular block, second degree: Secondary | ICD-10-CM | POA: Diagnosis not present

## 2023-02-14 LAB — CUP PACEART REMOTE DEVICE CHECK
Battery Remaining Longevity: 142 mo
Battery Voltage: 3.05 V
Brady Statistic AP VP Percent: 26.07 %
Brady Statistic AP VS Percent: 0.01 %
Brady Statistic AS VP Percent: 73.73 %
Brady Statistic AS VS Percent: 0.19 %
Brady Statistic RA Percent Paced: 26.09 %
Brady Statistic RV Percent Paced: 99.8 %
Date Time Interrogation Session: 20241011101911
Implantable Lead Connection Status: 753985
Implantable Lead Connection Status: 753985
Implantable Lead Implant Date: 20230713
Implantable Lead Implant Date: 20230713
Implantable Lead Location: 753859
Implantable Lead Location: 753860
Implantable Lead Model: 3830
Implantable Lead Model: 5076
Implantable Pulse Generator Implant Date: 20230713
Lead Channel Impedance Value: 323 Ohm
Lead Channel Impedance Value: 380 Ohm
Lead Channel Impedance Value: 475 Ohm
Lead Channel Impedance Value: 646 Ohm
Lead Channel Pacing Threshold Amplitude: 0.625 V
Lead Channel Pacing Threshold Amplitude: 0.625 V
Lead Channel Pacing Threshold Pulse Width: 0.4 ms
Lead Channel Pacing Threshold Pulse Width: 0.4 ms
Lead Channel Sensing Intrinsic Amplitude: 2.875 mV
Lead Channel Sensing Intrinsic Amplitude: 2.875 mV
Lead Channel Sensing Intrinsic Amplitude: 4.125 mV
Lead Channel Sensing Intrinsic Amplitude: 4.125 mV
Lead Channel Setting Pacing Amplitude: 1.5 V
Lead Channel Setting Pacing Amplitude: 2 V
Lead Channel Setting Pacing Pulse Width: 0.4 ms
Lead Channel Setting Sensing Sensitivity: 1.2 mV
Zone Setting Status: 755011

## 2023-02-21 NOTE — Progress Notes (Signed)
Remote pacemaker transmission.   

## 2023-03-11 ENCOUNTER — Ambulatory Visit: Payer: Medicare PPO | Attending: Cardiology | Admitting: Cardiology

## 2023-03-11 ENCOUNTER — Encounter: Payer: Self-pay | Admitting: Cardiology

## 2023-03-11 VITALS — BP 120/76 | HR 71 | Ht 70.0 in | Wt 170.8 lb

## 2023-03-11 DIAGNOSIS — I480 Paroxysmal atrial fibrillation: Secondary | ICD-10-CM

## 2023-03-11 DIAGNOSIS — I459 Conduction disorder, unspecified: Secondary | ICD-10-CM | POA: Diagnosis not present

## 2023-03-11 DIAGNOSIS — I441 Atrioventricular block, second degree: Secondary | ICD-10-CM

## 2023-03-11 DIAGNOSIS — D6869 Other thrombophilia: Secondary | ICD-10-CM | POA: Diagnosis not present

## 2023-03-11 LAB — CUP PACEART INCLINIC DEVICE CHECK
Date Time Interrogation Session: 20241105144032
Implantable Lead Connection Status: 753985
Implantable Lead Connection Status: 753985
Implantable Lead Implant Date: 20230713
Implantable Lead Implant Date: 20230713
Implantable Lead Location: 753859
Implantable Lead Location: 753860
Implantable Lead Model: 3830
Implantable Lead Model: 5076
Implantable Pulse Generator Implant Date: 20230713

## 2023-03-11 NOTE — Patient Instructions (Signed)

## 2023-03-11 NOTE — Progress Notes (Signed)
  Electrophysiology Office Note:   Date:  03/11/2023  ID:  [name], DOB 29-Aug-1932, MRN [id number]  Primary Cardiologist: Verne Carrow, MD Electrophysiologist: Regan Lemming, MD      History of Present Illness:   [name] is a 89 y.o. male with h/o CKD stage III, hyperlipidemia, atrial fibrillation, symptomatic bradycardia post pacemaker seen today for routine electrophysiology followup.   Since last being seen in our clinic the patient reports doing well.  He feels much improved with his pacemaker implanted.  He has no shortness of breath or chest pain.  He is able to do all of his daily activities without restriction.  he denies chest pain, palpitations, dyspnea, PND, orthopnea, nausea, vomiting, dizziness, syncope, edema, weight gain, or early satiety.   Review of systems complete and found to be negative unless listed in HPI.      EP Information / Studies Reviewed:    EKG is ordered today. Personal review as below.  EKG Interpretation Date/Time:  Tuesday March 11 2023 14:58:29 EST Ventricular Rate:  71 PR Interval:  196 QRS Duration:  88 QT Interval:  392 QTC Calculation: 425 R Axis:   50  Text Interpretation: Atrial-sensed ventricular-paced rhythm When compared with ECG of 16-Nov-2021 05:09, Vent. rate has increased BY   8 BPM Confirmed by Elaijah Munoz ([id number]) on 03/11/2023 3:02:23 PM   PPM Interrogation-  reviewed in detail today,  See PACEART report.  Device History: [employer] Dual Chamber PPM implanted 11/15/2021 for Second Degree AV block  Risk Assessment/Calculations:    CHA2DS2-VASc Score = 2   This indicates a 2.2% annual risk of stroke. The patient's score is based upon: CHF History: 0 HTN History: 0 Diabetes History: 0 Stroke History: 0 Vascular Disease History: 0 Age Score: 2 Gender Score: 0             Physical Exam:   VS:  BP 120/76 (BP Location: Left Arm, Patient Position: Sitting, Cuff Size: Normal)   Pulse  71   Ht 5\' 10"  (1.778 m)   Wt 170 lb 12.8 oz (77.5 kg)   SpO2 98%   BMI 24.51 kg/m    Wt Readings from Last 3 Encounters:  03/11/23 170 lb 12.8 oz (77.5 kg)  10/17/22 171 lb 9.6 oz (77.8 kg)  04/24/22 174 lb 6.4 oz (79.1 kg)     GEN: Well nourished, well developed in no acute distress NECK: No JVD; No carotid bruits CARDIAC: Regular rate and rhythm, no murmurs, rubs, gallops RESPIRATORY:  Clear to auscultation without rales, wheezing or rhonchi  ABDOMEN: Soft, non-tender, non-distended EXTREMITIES:  No edema; No deformity   ASSESSMENT AND PLAN:    Second Degree AV block s/p [employer] PPM  Normal PPM function See Pace Art report No changes today  2.  Paroxysmal atrial fibrillation: Minimal episodes noted on device interrogation.  3.  Secondary hypercoagulable state: Currently on Xarelto for atrial fibrillation  Disposition:   Follow up with EP APP in 12 months  Signed, Suly Vukelich Jorja Loa, MD

## 2023-03-31 ENCOUNTER — Other Ambulatory Visit: Payer: Self-pay | Admitting: Cardiovascular Disease

## 2023-03-31 DIAGNOSIS — I480 Paroxysmal atrial fibrillation: Secondary | ICD-10-CM

## 2023-03-31 NOTE — Telephone Encounter (Signed)
Please review

## 2023-03-31 NOTE — Telephone Encounter (Signed)
Prescription refill request for Xarelto received.  Indication:afib Last office visit:11/24 Weight:77.5  kg Age:89 Scr:1.5 2023 CrCl:37.31  ml/min  Prescription refilled

## 2023-05-08 ENCOUNTER — Encounter: Payer: Self-pay | Admitting: Cardiovascular Disease

## 2023-05-09 ENCOUNTER — Ambulatory Visit: Payer: Medicare PPO | Admitting: Cardiovascular Disease

## 2023-05-16 ENCOUNTER — Ambulatory Visit: Payer: Medicare PPO

## 2023-05-16 DIAGNOSIS — I441 Atrioventricular block, second degree: Secondary | ICD-10-CM | POA: Diagnosis not present

## 2023-05-17 LAB — CUP PACEART REMOTE DEVICE CHECK
Battery Remaining Longevity: 137 mo
Battery Voltage: 3.04 V
Brady Statistic AP VP Percent: 17.06 %
Brady Statistic AP VS Percent: 0.01 %
Brady Statistic AS VP Percent: 82.61 %
Brady Statistic AS VS Percent: 0.32 %
Brady Statistic RA Percent Paced: 17.14 %
Brady Statistic RV Percent Paced: 99.67 %
Date Time Interrogation Session: 20250109225718
Implantable Lead Connection Status: 753985
Implantable Lead Connection Status: 753985
Implantable Lead Implant Date: 20230713
Implantable Lead Implant Date: 20230713
Implantable Lead Location: 753859
Implantable Lead Location: 753860
Implantable Lead Model: 3830
Implantable Lead Model: 5076
Implantable Pulse Generator Implant Date: 20230713
Lead Channel Impedance Value: 323 Ohm
Lead Channel Impedance Value: 380 Ohm
Lead Channel Impedance Value: 437 Ohm
Lead Channel Impedance Value: 570 Ohm
Lead Channel Pacing Threshold Amplitude: 0.625 V
Lead Channel Pacing Threshold Amplitude: 0.625 V
Lead Channel Pacing Threshold Pulse Width: 0.4 ms
Lead Channel Pacing Threshold Pulse Width: 0.4 ms
Lead Channel Sensing Intrinsic Amplitude: 3.125 mV
Lead Channel Sensing Intrinsic Amplitude: 3.125 mV
Lead Channel Sensing Intrinsic Amplitude: 3.75 mV
Lead Channel Sensing Intrinsic Amplitude: 3.75 mV
Lead Channel Setting Pacing Amplitude: 1.5 V
Lead Channel Setting Pacing Amplitude: 2 V
Lead Channel Setting Pacing Pulse Width: 0.4 ms
Lead Channel Setting Sensing Sensitivity: 1.2 mV
Zone Setting Status: 755011

## 2023-06-20 NOTE — Addendum Note (Signed)
Addended by: Elease Etienne A on: 06/20/2023 09:51 AM   Modules accepted: Orders

## 2023-06-20 NOTE — Progress Notes (Signed)
Remote pacemaker transmission.

## 2023-08-04 ENCOUNTER — Ambulatory Visit: Payer: Medicare PPO | Attending: Cardiovascular Disease | Admitting: Cardiovascular Disease

## 2023-08-04 ENCOUNTER — Encounter: Payer: Self-pay | Admitting: Cardiovascular Disease

## 2023-08-04 VITALS — BP 102/70 | HR 76 | Ht 70.0 in | Wt 167.0 lb

## 2023-08-04 DIAGNOSIS — I441 Atrioventricular block, second degree: Secondary | ICD-10-CM

## 2023-08-04 DIAGNOSIS — E7800 Pure hypercholesterolemia, unspecified: Secondary | ICD-10-CM

## 2023-08-04 DIAGNOSIS — I351 Nonrheumatic aortic (valve) insufficiency: Secondary | ICD-10-CM

## 2023-08-04 DIAGNOSIS — I480 Paroxysmal atrial fibrillation: Secondary | ICD-10-CM

## 2023-08-04 NOTE — Patient Instructions (Signed)
 Medication Instructions:  No changes *If you need a refill on your cardiac medications before your next appointment, please call your pharmacy*  Lab Work: none   Testing/Procedures: Your physician has requested that you have an echocardiogram. Echocardiography is a painless test that uses sound waves to create images of your heart. It provides your doctor with information about the size and shape of your heart and how well your heart's chambers and valves are working. This procedure takes approximately one hour. There are no restrictions for this procedure. Please do NOT wear cologne, perfume, aftershave, or lotions (deodorant is allowed). Please arrive 15 minutes prior to your appointment time.  Please note: We ask at that you not bring children with you during ultrasound (echo/ vascular) testing. Due to room size and safety concerns, children are not allowed in the ultrasound rooms during exams. Our front office staff cannot provide observation of children in our lobby area while testing is being conducted. An adult accompanying a patient to their appointment will only be allowed in the ultrasound room at the discretion of the ultrasound technician under special circumstances. We apologize for any inconvenience.   Follow-Up: At [facility], you and your health needs are our priority.  As part of our continuing mission to provide you with exceptional heart care, our providers are all part of one team.  This team includes your primary Cardiologist (physician) and Advanced Practice Providers or APPs (Physician Assistants and Nurse Practitioners) who all work together to provide you with the care you need, when you need it.  Your next appointment:   12 month(s)  Provider:   Verne Carrow, MD         1st Floor: - Lobby - Registration  - Pharmacy  - Lab - Cafe  2nd Floor: - PV Lab - Diagnostic Testing (echo, CT, nuclear med)  3rd Floor: - Vacant  4th Floor: -  TCTS (cardiothoracic surgery) - AFib Clinic - Structural Heart Clinic - Vascular Surgery  - Vascular Ultrasound  5th Floor: - HeartCare Cardiology (general and EP) - Clinical Pharmacy for coumadin, hypertension, lipid, weight-loss medications, and med management appointments    Valet parking services will be available as well.

## 2023-08-04 NOTE — Progress Notes (Signed)
 Chief Complaint  Patient presents with   Follow-up    PAF   History of Present Illness: 89 yo male with history of mild aortic valve insufficiency, paroxysmal atrial fibrillation, symptomatic bradycardia s/p pacemaker placement in July 2023, prior PE, hyperlipidemia, vasovagal syncope, thoracic aortic aneurysm, RBBB and chronic kidney disease stage 3 who is here today for cardiac follow up. He has a history of unprovoked pulmonary emboli diagnosed in July of 2008 and was on coumadin until this was stopped in August 2015 by Hematology. He is not known to have CAD. Nuclear stress test without ischemia December 2017. Echo January 2018 with normal LV systolic function, mild AI, trivial MR. He has passed out easily his entire life and passes out after any procedure. 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 He was seen by Dr. Everlena Cooper with Neurology and also evaluated by Neurosurgery. No further workup planned. Dizziness felt to be due to vertigo. He was diagnosed with atrial fibrillation 07/11/16 at [facility] following episode of neck pain and dizziness. He converted to sinus in their ED. He was started on Cardizem and Xarelto. He was found to have brief runs of atrial fib on cardiac monitor April 2018 so he was continued on Xarelto. He was admitted to [facility] in July 2023 with symptomatic bradycardia and a permanent pacemaker was placed. Echo July 2023 with LVEF=55-60%, mild mitral regurgitation, mild aortic insufficiency.   He is here today for follow up. The patient denies any chest pain, dyspnea, palpitations, lower extremity edema, orthopnea, PND, dizziness, near syncope or syncope.   Primary Care Physician: Prudence Davidson, MD   Past Medical History:  Diagnosis Date   BPH (benign prostatic hyperplasia)    CKD (chronic kidney disease), stage III (HCC)    DVT (deep venous  thrombosis) (HCC)    GERD (gastroesophageal reflux disease)    Hernia    Hyperlipidemia    Lung nodule    Mild aortic insufficiency    Mild dilation of ascending aorta (HCC)    PAF (paroxysmal atrial fibrillation) (HCC)    Pulmonary embolism (HCC)    RBBB    Syncope     Past Surgical History:  Procedure Laterality Date   EYE SURGERY  1957   INGUINAL HERNIA REPAIR     x 3   Left leg trauma     LIPOMA RESECTION     PACEMAKER IMPLANT N/A 11/15/2021   Procedure: PACEMAKER IMPLANT;  Surgeon: Regan Lemming, MD;  Location: [facility] INVASIVE CV LAB;  Service: Cardiovascular;  Laterality: N/A;    Current Outpatient Medications  Medication Sig Dispense Refill   acetaminophen (TYLENOL) 325 MG tablet Take 1-2 tablets (325-650 mg total) by mouth every 4 (four) hours as needed for mild pain.     alfuzosin (UROXATRAL) 10 MG 24 hr tablet Take 10 mg by mouth daily with breakfast.     ammonium lactate (AMLACTIN) 12 % cream Apply 1 Application topically as needed for dry skin.     ascorbic acid (VITAMIN C) 1000 MG tablet Take 500 mg by mouth.     Cholecalciferol (VITAMIN D-3 PO) Take 1 capsule by mouth daily.     Cyanocobalamin (VITAMIN B-12 PO) Take 1 tablet by mouth daily.     diltiazem (CARDIZEM CD) 120 MG 24 hr capsule TAKE 1 CAPSULE(120 MG) BY MOUTH DAILY 90 capsule  2   esomeprazole (NEXIUM) 20 MG capsule Take 20 mg by mouth daily at 12 noon.     ezetimibe (ZETIA) 10 MG tablet Take 1 tablet (10 mg total) by mouth daily. 30 tablet 11   hydrocortisone cream 1 % Apply topically 3 (three) times daily. 30 g 0   lubiprostone (AMITIZA) 8 MCG capsule Take 8 mcg by mouth daily as needed for constipation.     XARELTO 15 MG TABS tablet TAKE 1 TABLET(15 MG) BY MOUTH DAILY WITH SUPPER 90 tablet 1   No current facility-administered medications for this visit.    Allergies  Allergen Reactions   Tape Rash   Anti-Oxidant Other (See Comments)    GI upset   Centrum Other (See Comments)    GI upset    Latex Rash    Social History   Socioeconomic History   Marital status: Married    Spouse name: Not on file   Number of children: 2   Years of education: Not on file   Highest education level: Not on file  Occupational History   Occupation: Horticulturist, commercial for [employer], educational  Tobacco Use   Smoking status: Never   Smokeless tobacco: Never  Vaping Use   Vaping status: Never Used  Substance and Sexual Activity   Alcohol use: No   Drug use: No   Sexual activity: Not on file  Other Topics Concern   Not on file  Social History Narrative   Right Handed    Lives in a one story home   Drinks Caffeine    Social Drivers of Health   Financial Resource Strain: Not on file  Food Insecurity: Not on file  Transportation Needs: Not on file  Physical Activity: Not on file  Stress: Not on file  Social Connections: Unknown (09/08/2021)   Received from [facility]   Social Network    Social Network: Not on file  Intimate Partner Violence: Unknown (08/07/2021)   Received [facility]   HITS    Physically Hurt: Not on file    Insult or Talk Down To: Not on file    Threaten Physical Harm: Not on file    Scream or Curse: Not on file    Family History  Problem Relation Age of Onset   Heart failure Father    Heart attack Father        x2   Cancer Mother        of eye-causing her to lose her eye    Review of Systems:  As stated in the HPI and otherwise negative.   BP 102/70   Pulse 76   Ht 5\' 10"  (1.778 m)   Wt 75.8 kg   SpO2 96%   BMI 23.96 kg/m   Physical Examination:  General: Well developed, well nourished, NAD  HEENT: OP clear, mucus membranes moist  SKIN: warm, dry. No rashes. Neuro: No focal deficits  Musculoskeletal: Muscle strength 5/5 all ext  Psychiatric: Mood and affect normal  Neck: No JVD, no carotid bruits, no thyromegaly, no lymphadenopathy.  Lungs:Clear bilaterally, no wheezes, rhonci,  crackles Cardiovascular: Regular rate and rhythm. No murmurs, gallops or rubs. Abdomen:Soft. Bowel sounds present. Non-tender.  Extremities: No lower extremity edema. Pulses are 2 + in the bilateral DP/PT.  EKG:  EKG is not ordered today. The EKG demonstrates   Recent Labs: No results found for requested labs within last 365 days.    Wt Readings from Last 3 Encounters:  08/04/23 75.8 kg  03/11/23 77.5 kg  10/17/22 77.8 kg    Assessment and Plan:   1.  Aortic regurgitation: Mild by echo in July 2023.  I do not hear a loud murmur today. Will repeat echo in March 2026.   2. Symptomatic bradycardia: Permanent pacemaker placed in July 2023 and followed in EP clinic by Dr. Raul Del.   3. Atrial fibrillation, paroxysmal: Sinus today on exam. Continue Xarelto and Cardizem.      4. Hyperlipidemia: Followed by primary care. Continue Zetia.     5. Thoracic aortic aneurysm: Minimal by echo in July 2023.  (3.8 cm)   Labs/ tests ordered today include:   Orders Placed This Encounter  Procedures   ECHOCARDIOGRAM COMPLETE   Disposition:   F/U with me in 12 months  Signed, Verne Carrow, MD 08/04/2023 4:00 PM    [facility] [address] [place], [place], [place]  [address] Phone: [phone]; Fax: [phone]

## 2023-08-15 ENCOUNTER — Ambulatory Visit (INDEPENDENT_AMBULATORY_CARE_PROVIDER_SITE_OTHER): Payer: Medicare PPO

## 2023-08-15 DIAGNOSIS — I441 Atrioventricular block, second degree: Secondary | ICD-10-CM | POA: Diagnosis not present

## 2023-08-18 LAB — CUP PACEART REMOTE DEVICE CHECK
Battery Remaining Longevity: 135 mo
Battery Voltage: 3.03 V
Brady Statistic AP VP Percent: 24.38 %
Brady Statistic AP VS Percent: 0.01 %
Brady Statistic AS VP Percent: 75.48 %
Brady Statistic AS VS Percent: 0.13 %
Brady Statistic RA Percent Paced: 24.34 %
Brady Statistic RV Percent Paced: 99.74 %
Date Time Interrogation Session: 20250410234222
Implantable Lead Connection Status: 753985
Implantable Lead Connection Status: 753985
Implantable Lead Implant Date: 20230713
Implantable Lead Implant Date: 20230713
Implantable Lead Location: 753859
Implantable Lead Location: 753860
Implantable Lead Model: 3830
Implantable Lead Model: 5076
Implantable Pulse Generator Implant Date: 20230713
Lead Channel Impedance Value: 304 Ohm
Lead Channel Impedance Value: 361 Ohm
Lead Channel Impedance Value: 437 Ohm
Lead Channel Impedance Value: 627 Ohm
Lead Channel Pacing Threshold Amplitude: 0.625 V
Lead Channel Pacing Threshold Amplitude: 0.75 V
Lead Channel Pacing Threshold Pulse Width: 0.4 ms
Lead Channel Pacing Threshold Pulse Width: 0.4 ms
Lead Channel Sensing Intrinsic Amplitude: 2.875 mV
Lead Channel Sensing Intrinsic Amplitude: 2.875 mV
Lead Channel Sensing Intrinsic Amplitude: 8 mV
Lead Channel Sensing Intrinsic Amplitude: 8 mV
Lead Channel Setting Pacing Amplitude: 1.5 V
Lead Channel Setting Pacing Amplitude: 2 V
Lead Channel Setting Pacing Pulse Width: 0.4 ms
Lead Channel Setting Sensing Sensitivity: 1.2 mV
Zone Setting Status: 755011

## 2023-09-18 ENCOUNTER — Telehealth: Payer: Self-pay | Admitting: *Deleted

## 2023-09-18 ENCOUNTER — Telehealth: Payer: Self-pay

## 2023-09-18 ENCOUNTER — Telehealth: Payer: Self-pay | Admitting: Cardiovascular Disease

## 2023-09-18 NOTE — Telephone Encounter (Signed)
 Patient calling to speak to the Dr about getting transfer to [facility]. He states that he is another city [place] a hospital. Please advise

## 2023-09-18 NOTE — Addendum Note (Signed)
 Addended by: Lott Rouleau A on: 09/18/2023 11:23 AM   Modules accepted: Orders

## 2023-09-18 NOTE — Telephone Encounter (Signed)
 Pt calling in from [facility] for sepsis.  They are worried the bacteria may get into PPM system.  Patient does not want to be there at [facility] if anything needs to occur w/ his PPM, he would like to be transferred to [facility].   Advised pt to discuss with MD there and let them know he would like to be transferred to [facility] for further care.  Aware depending on infection/sepsis, bed availability they may be able to transfer him as requested.  Informed pt I would forward to Dr. Lawana Pray for his FYI in case pt is able to transfer to [facility].  Pt appreciates talking with him about this matter.

## 2023-09-18 NOTE — Progress Notes (Signed)
 Remote pacemaker transmission.

## 2023-09-18 NOTE — Telephone Encounter (Signed)
 Spoke with patient and he states he has been admitted to the hospital for sepsis. He would [facility] to [facility] and wanted to know if provider can do a transfer.   Did inform patient he is not admitted for cardiac reason that we will not be able to do the transfer. He verbalized understanding

## 2023-09-19 ENCOUNTER — Encounter (HOSPITAL_COMMUNITY): Payer: Self-pay

## 2023-09-19 NOTE — Telephone Encounter (Signed)
 Spoke with pt's daughter [name] and she was updating our office that [facility] has denied the transfer for the pt from [facility] in [place]. Infectious disease states he needs TEE but d/t to lack of cardiologist availability, this can't be done until Monday. [name] is concerned because since the pt has been admitted in the hospital, there has not been a cardiologist that has come by to see him. She is concerned that it could be d/t the hospitalist, Dr. Lavonna Prader, not pushing the importance of this enough. Explained to pt's daughter that I will forward this information to Dr. Lawana Pray who is rounding at the hospital (explained this his nurse Catherin Closs is off today). Pt's daughter verbalized understanding and had no further questions at this time.  [name] [phone]

## 2023-09-19 NOTE — Telephone Encounter (Signed)
 The pt daughter wants to speak with Dr, Lawana Pray  nurse. Genevia Kern number is  [phone]. The pt spoke with Sherri yesterday because the pt wants to be transferred to [facility] from [facility]. I told her I will send  a note and someone will give her a call back.

## 2023-09-19 NOTE — Telephone Encounter (Signed)
 error

## 2023-09-24 NOTE — Telephone Encounter (Signed)
 Left detailed message asking dtr to call me next week to update me on pt status.  Aware I will not be in the office the remainder of this week.

## 2023-09-27 ENCOUNTER — Other Ambulatory Visit: Payer: Self-pay | Admitting: Cardiovascular Disease

## 2023-09-27 DIAGNOSIS — I480 Paroxysmal atrial fibrillation: Secondary | ICD-10-CM

## 2023-09-30 ENCOUNTER — Telehealth: Payer: Self-pay | Admitting: Acute Care

## 2023-09-30 NOTE — Telephone Encounter (Signed)
 Prescription refill request for Xarelto  received.  Indication:afib Last office visit:3/25 Weight:75.8  kg Age:89 Scr:1.47  5/25 CrCl:37.24  ml/min  Prescription refilled

## 2023-09-30 NOTE — Telephone Encounter (Signed)
 Dr. Linder Revere received report of pt's CT chest/abdomen/pelvis that was performed on 09/17/23 at [facility] while in ED for suspected pna with fever. Report was given to SG for screening lung nodule. It was noted on CT that pt has a 6mm nodule in right apex and Dr. Linder Revere would like this followed. Patient is 89yo and does not qualify for LCS. Per Dara Ear NP patient can be scheduled with her or Dr. Baldwin Levee for a nodule consult. Per the report with [facility] the pt would need a 6 month repeat CT chest to revaluate the 6mm nodule (this would be due in 03/2024).   Called patient and left VM for him to schedule appt.

## 2023-10-02 NOTE — Telephone Encounter (Signed)
 Spoke with patient and lung nodule appt confirmed with  Dara Ear, NP for 10/13/23 at 1130am

## 2023-10-05 ENCOUNTER — Other Ambulatory Visit: Payer: Self-pay | Admitting: Cardiovascular Disease

## 2023-10-05 DIAGNOSIS — I480 Paroxysmal atrial fibrillation: Secondary | ICD-10-CM

## 2023-10-06 ENCOUNTER — Telehealth: Payer: Self-pay

## 2023-10-06 NOTE — Telephone Encounter (Signed)
 Pt states he is doing ok. TEE "found no bacteria to the heart/leads/ppm". Will forward to Dr. Lawana Pray to ensure nothing else needs to be done/followed up on for the patient. (Routine yearly follow up in November 2025) Pt aware I will only follow up if Dr. Lawana Pray has other advisement than to keep routine follow up later this year.    Forwarding to Dr. Abel Hoe, per pt request, to ensure he is aware pt recently at [facility] for "bacteria in the blood".

## 2023-10-06 NOTE — Telephone Encounter (Signed)
 Pt states he got his TEE results and wanted to make sure Dr. Lawana Pray nurse have the results. I told him she will give him a call back.

## 2023-10-13 ENCOUNTER — Encounter: Admitting: Acute Care

## 2023-10-13 ENCOUNTER — Encounter: Payer: Self-pay | Admitting: Acute Care

## 2023-10-13 ENCOUNTER — Ambulatory Visit: Admitting: Acute Care

## 2023-10-13 VITALS — BP 123/73 | HR 90 | Ht 71.0 in | Wt 162.2 lb

## 2023-10-13 DIAGNOSIS — Z86718 Personal history of other venous thrombosis and embolism: Secondary | ICD-10-CM

## 2023-10-13 DIAGNOSIS — R911 Solitary pulmonary nodule: Secondary | ICD-10-CM

## 2023-10-13 NOTE — Progress Notes (Addendum)
 History of Present Illness [name] is a 89 y.o. male  never smoker referred by Dr. Linder Revere 10/2023 for evaluation of a recently found pulmonary lung nodule. He will be followed by Dr. Baldwin Levee for the lung nodule.   PMH also includes Asthma.and PE/ DVT for which patient is on lifetime anticoagulation ( Xarelto )  ( 2013, +Lupus aticoagulant) .  Pt. Has consented to use of [employer]bridge soft wear to help capture the content of this OV .   10/13/2023 [name] is an 89 year old male with never smoker with PMH of  DVT/ pulmonary embolism who presents for follow-up of a lung nodule.  A lung nodule was discovered during a hospital stay from May 14 to Sep 22, 2023, when he was admitted for sepsis due to an unspecified organism. No recent pneumonia, but he recalls past scarring in his lungs. He denies  hemoptysis, the scan shows a  6 mm nodule in the right apex Recommendation is for consideration of a  follow-up CT chest without contrast in 6 months. He has experienced a weight loss of about 10 pounds over the last six months, with a current weight of 162 pounds fully dressed and a BMI of 22. He notes a decrease in appetite.  No history of smoking but was exposed to secondhand smoke in the office environment 60 years ago. He is currently on Xarelto  for a history of bilateral pulmonary emboli and lupus anticoagulant.  Family history includes his mother having had eye cancer, but no other significant family history of cancer is noted. He also reports a hernia that is being monitored.  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   Test Results: CT Chest 09/17/2023 CHEST:  Thoracic inlet/central airways: Thyroid normal. Airway patent.  Mediastinum/hila/axilla: No adenopathy.  Heart/vessels: Normal heart size. No pericardial effusion. Aorta normal in caliber and appearance.  Left subclavian dual-chamber pacemaker.  Lungs/pleura: Mild to moderate emphysema and mild central bronchial  thickening consistent with mixed emphysema/airway COPD. Chronic discoid scarring lung bases. 6 mm nodule right apex. Calcified granuloma right lower lobe.      Latest Ref Rng & Units 11/14/2021    5:38 AM 03/26/2017   11:01 AM 09/19/2016   11:56 AM  CBC  WBC 4.0 - 10.5 K/uL 7.0  6.3  6.5   Hemoglobin 13.0 - 17.0 g/dL 16.1  09.6  04.5   Hematocrit 39.0 - 52.0 % 38.9  44.7  44.7   Platelets 150 - 400 K/uL 181  208  246        Latest Ref Rng & Units 11/14/2021    5:38 AM 03/26/2017   11:01 AM 09/19/2016   11:56 AM  BMP  Glucose 70 - 99 mg/dL 98  409  811   BUN 8 - 23 mg/dL 19  22  20    Creatinine 0.61 - 1.24 mg/dL 9.14  7.82  9.56   BUN/Creat Ratio 10 - 24  16  14    Sodium 135 - 145 mmol/L 142  141  140   Potassium 3.5 - 5.1 mmol/L 4.4  4.3  4.8   Chloride 98 - 111 mmol/L 111  103  99   CO2 22 - 32 mmol/L 23  22  23    Calcium 8.9 - 10.3 mg/dL 8.8  9.3  9.4     BNP No results found for: "BNP"  ProBNP    Component Value Date/Time   PROBNP <30.0 11/28/2006 0330    PFT  No results found for: "FEV1PRE", "FEV1POST", "FVCPRE", "FVCPOST", "TLC", "DLCOUNC", "PREFEV1FVCRT", "PSTFEV1FVCRT"  No results found.   Past medical hx Past Medical History:  Diagnosis Date   BPH (benign prostatic hyperplasia)    CKD (chronic kidney disease), stage III (HCC)    DVT (deep venous thrombosis) (HCC)    GERD (gastroesophageal reflux disease)    Hernia    Hyperlipidemia    Lung nodule    Mild aortic insufficiency    Mild dilation of ascending aorta (HCC)    PAF (paroxysmal atrial fibrillation) (HCC)    Pulmonary embolism (HCC)    RBBB    Syncope      Social History   Tobacco Use   Smoking status: Never    Passive exposure: Past   Smokeless tobacco: Never  Vaping Use   Vaping status: Never Used  Substance Use Topics   Alcohol use: No   Drug use: No    Mr.Musial reports that he has never smoked. He has been exposed to tobacco smoke. He has never used smokeless tobacco. He  reports that he does not drink alcohol and does not use drugs.  Tobacco Cessation: Never smoker    Past surgical hx, Family hx, Social hx all reviewed.  Current Outpatient Medications on File Prior to Visit  Medication Sig   acetaminophen  (TYLENOL ) 325 MG tablet Take 1-2 tablets (325-650 mg total) by mouth every 4 (four) hours as needed for mild pain.   alfuzosin (UROXATRAL) 10 MG 24 hr tablet Take 10 mg by mouth daily with breakfast.   ammonium lactate (AMLACTIN) 12 % cream Apply 1 Application topically as needed for dry skin.   ascorbic acid (VITAMIN C) 1000 MG tablet Take 500 mg by mouth.   Cholecalciferol (VITAMIN D-3 PO) Take 1 capsule by mouth daily.   Cyanocobalamin (VITAMIN B-12 PO) Take 1 tablet by mouth daily.   diltiazem  (CARDIZEM  CD) 120 MG 24 hr capsule TAKE 1 CAPSULE(120 MG) BY MOUTH DAILY   esomeprazole (NEXIUM) 20 MG capsule Take 20 mg by mouth daily at 12 noon.   ezetimibe  (ZETIA ) 10 MG tablet Take 1 tablet (10 mg total) by mouth daily.   hydrocortisone  cream 1 % Apply topically 3 (three) times daily.   lubiprostone (AMITIZA) 8 MCG capsule Take 8 mcg by mouth daily as needed for constipation.   XARELTO  15 MG TABS tablet TAKE 1 TABLET(15 MG) BY MOUTH DAILY WITH SUPPER   No current facility-administered medications on file prior to visit.     Allergies  Allergen Reactions   Tape Rash   Anti-Oxidant Other (See Comments)    GI upset   Centrum Other (See Comments)    GI upset   Latex Rash    Review Of Systems:  Constitutional:   +  weight loss, No night sweats,  Fevers, chills, fatigue, or  lassitude.  HEENT:   No headaches,  Difficulty swallowing,  Tooth/dental problems, or  Sore throat,                No sneezing, itching, ear ache, nasal congestion, post nasal drip,   CV:  No chest pain,  Orthopnea, PND, swelling in lower extremities, anasarca, dizziness, palpitations, syncope.   GI  No heartburn, indigestion, abdominal pain, nausea, vomiting, diarrhea,  change in bowel habits, loss of appetite, bloody stools.   Resp: + occasional  shortness of breath with exertion none  at rest.  No excess mucus, no productive cough,  No non-productive cough,  No coughing up of blood.  No change in color of mucus.  No wheezing.  No chest wall deformity  Skin: no rash or lesions.  GU: no dysuria, change in color of urine, no urgency or frequency.  No flank pain, no hematuria   MS:  No joint pain or swelling.  No decreased range of motion.  No back pain.  Psych:  No change in mood or affect. No depression or anxiety.  No memory loss.   Vital Signs BP 123/73 (BP Location: Left Arm, Patient Position: Sitting, Cuff Size: Normal)   Pulse 90   Ht 5\' 11"  (1.803 m)   Wt 162 lb 3.2 oz (73.6 kg)   SpO2 95%   BMI 22.62 kg/m    Physical Exam:  General- No distress,  A&Ox3, pleasant ENT: No sinus tenderness, TM clear, pale nasal mucosa, no oral exudate,no post nasal drip, no LAN Cardiac: S1, S2, regular rate and rhythm, no murmur Chest: No wheeze/ rales/ dullness; no accessory muscle use, no nasal flaring, no sternal retractions Abd.: Soft Non-tender, ND, BS +, Body mass index is 22.62 kg/m.  Ext: No clubbing cyanosis, edema, no obvious deformities Neuro:  normal strength, MAE x 4, A&O x 3 Skin: No rashes, warm and dry, no lesions  Psych: normal mood and behavior   Assessment/Plan Lung nodule in a never  smoker Hx of PE/DVT Plan I am glad you are feeling better after your hospital stay. We have reviewed the lung nodule on the most recent CT scan.  Plan will be for a 6 month follow up Ct chest.  This will be due mid 03/2024. You will get a call closer to the time to get this scheduled. You will follow up with me within 1-2 weeks after the scan to review the results, and determine next best steps. Call if you need us  sooner, or for any unexplained weight loss/ blood in you sputum.  Please contact office for sooner follow up if symptoms do not improve  or worsen or seek emergency care    I spent 25 minutes dedicated to the care of this patient on the date of this encounter to include pre-visit review of records, face-to-face time with the patient discussing conditions above, post visit ordering of testing, clinical documentation with the electronic health record, making appropriate referrals as documented, and communicating necessary information to the patient's healthcare team.     Raejean Bullock, NP 10/13/2023  11:51 AM

## 2023-10-13 NOTE — Progress Notes (Unsigned)
 History of Present Illness [name] is a 89 y.o. male with ***   10/13/2023  Test Results:     Latest Ref Rng & Units 11/14/2021    5:38 AM 03/26/2017   11:01 AM 09/19/2016   11:56 AM  CBC  WBC 4.0 - 10.5 K/uL 7.0  6.3  6.5   Hemoglobin 13.0 - 17.0 g/dL 16.1  09.6  04.5   Hematocrit 39.0 - 52.0 % 38.9  44.7  44.7   Platelets 150 - 400 K/uL 181  208  246        Latest Ref Rng & Units 11/14/2021    5:38 AM 03/26/2017   11:01 AM 09/19/2016   11:56 AM  BMP  Glucose 70 - 99 mg/dL 98  409  811   BUN 8 - 23 mg/dL 19  22  20    Creatinine 0.61 - 1.24 mg/dL 9.14  7.82  9.56   BUN/Creat Ratio 10 - 24  16  14    Sodium 135 - 145 mmol/L 142  141  140   Potassium 3.5 - 5.1 mmol/L 4.4  4.3  4.8   Chloride 98 - 111 mmol/L 111  103  99   CO2 22 - 32 mmol/L 23  22  23    Calcium 8.9 - 10.3 mg/dL 8.8  9.3  9.4     BNP No results found for: "BNP"  ProBNP    Component Value Date/Time   PROBNP <30.0 11/28/2006 0330    PFT No results found for: "FEV1PRE", "FEV1POST", "FVCPRE", "FVCPOST", "TLC", "DLCOUNC", "PREFEV1FVCRT", "PSTFEV1FVCRT"  No results found.   Past medical hx Past Medical History:  Diagnosis Date   BPH (benign prostatic hyperplasia)    CKD (chronic kidney disease), stage III (HCC)    DVT (deep venous thrombosis) (HCC)    GERD (gastroesophageal reflux disease)    Hernia    Hyperlipidemia    Lung nodule    Mild aortic insufficiency    Mild dilation of ascending aorta (HCC)    PAF (paroxysmal atrial fibrillation) (HCC)    Pulmonary embolism (HCC)    RBBB    Syncope      Social History   Tobacco Use   Smoking status: Never    Passive exposure: Past   Smokeless tobacco: Never  Vaping Use   Vaping status: Never Used  Substance Use Topics   Alcohol use: No   Drug use: No    Mr.[name] reports that he has never smoked. He has been exposed to tobacco smoke. He has never used smokeless tobacco. He reports that he does not drink alcohol and does  not use drugs.  Tobacco Cessation: Counseling given: Not Answered   Past surgical hx, Family hx, Social hx all reviewed.  Current Outpatient Medications on File Prior to Visit  Medication Sig   acetaminophen  (TYLENOL ) 325 MG tablet Take 1-2 tablets (325-650 mg total) by mouth every 4 (four) hours as needed for mild pain.   alfuzosin (UROXATRAL) 10 MG 24 hr tablet Take 10 mg by mouth daily with breakfast.   ammonium lactate (AMLACTIN) 12 % cream Apply 1 Application topically as needed for dry skin.   ascorbic acid (VITAMIN C) 1000 MG tablet Take 500 mg by mouth.   Cholecalciferol (VITAMIN D-3 PO) Take 1 capsule by mouth daily.   Cyanocobalamin (VITAMIN B-12 PO) Take 1 tablet by mouth daily.   diltiazem  (CARDIZEM  CD) 120 MG 24 hr capsule TAKE 1 CAPSULE(120 MG) BY MOUTH DAILY   esomeprazole (  NEXIUM) 20 MG capsule Take 20 mg by mouth daily at 12 noon.   ezetimibe  (ZETIA ) 10 MG tablet Take 1 tablet (10 mg total) by mouth daily.   hydrocortisone  cream 1 % Apply topically 3 (three) times daily.   lubiprostone (AMITIZA) 8 MCG capsule Take 8 mcg by mouth daily as needed for constipation.   XARELTO  15 MG TABS tablet TAKE 1 TABLET(15 MG) BY MOUTH DAILY WITH SUPPER   No current facility-administered medications on file prior to visit.     Allergies  Allergen Reactions   Tape Rash   Anti-Oxidant Other (See Comments)    GI upset   Centrum Other (See Comments)    GI upset   Latex Rash    Review Of Systems:  Constitutional:   No  weight loss, night sweats,  Fevers, chills, fatigue, or  lassitude.  HEENT:   No headaches,  Difficulty swallowing,  Tooth/dental problems, or  Sore throat,                No sneezing, itching, ear ache, nasal congestion, post nasal drip,   CV:  No chest pain,  Orthopnea, PND, swelling in lower extremities, anasarca, dizziness, palpitations, syncope.   GI  No heartburn, indigestion, abdominal pain, nausea, vomiting, diarrhea, change in bowel habits, loss of  appetite, bloody stools.   Resp: No shortness of breath with exertion or at rest.  No excess mucus, no productive cough,  No non-productive cough,  No coughing up of blood.  No change in color of mucus.  No wheezing.  No chest wall deformity  Skin: no rash or lesions.  GU: no dysuria, change in color of urine, no urgency or frequency.  No flank pain, no hematuria   MS:  No joint pain or swelling.  No decreased range of motion.  No back pain.  Psych:  No change in mood or affect. No depression or anxiety.  No memory loss.   Vital Signs There were no vitals taken for this visit.   Physical Exam:  General- No distress,  A&Ox3 ENT: No sinus tenderness, TM clear, pale nasal mucosa, no oral exudate,no post nasal drip, no LAN Cardiac: S1, S2, regular rate and rhythm, no murmur Chest: No wheeze/ rales/ dullness; no accessory muscle use, no nasal flaring, no sternal retractions Abd.: Soft Non-tender Ext: No clubbing cyanosis, edema Neuro:  normal strength Skin: No rashes, warm and dry Psych: normal mood and behavior   Assessment/Plan  No problem-specific Assessment & Plan notes found for this encounter.    Raejean Bullock, NP 10/13/2023  11:48 AM

## 2023-10-13 NOTE — Patient Instructions (Addendum)
 It is good to see you today. I am glad you are feeling better after your hospital stay. We have reviewed the lung nodule on the most recent CT scan.  Plan will be for a 6 month follow up Ct chest.  This will be due mid 03/2024. You will get a call closer to the time to get this scheduled. You will follow up with me within 1-2 weeks after the scan to review the results, and determine next best steps. Call if you need us  sooner.  Please contact office for sooner follow up if symptoms do not improve or worsen or seek emergency care

## 2023-10-17 ENCOUNTER — Ambulatory Visit: Admitting: Acute Care

## 2023-11-14 ENCOUNTER — Ambulatory Visit: Payer: Medicare PPO

## 2023-11-14 DIAGNOSIS — I441 Atrioventricular block, second degree: Secondary | ICD-10-CM | POA: Diagnosis not present

## 2023-11-15 LAB — CUP PACEART REMOTE DEVICE CHECK
Battery Remaining Longevity: 132 mo
Battery Voltage: 3.03 V
Brady Statistic AP VP Percent: 18.88 %
Brady Statistic AP VS Percent: 0.01 %
Brady Statistic AS VP Percent: 80.76 %
Brady Statistic AS VS Percent: 0.36 %
Brady Statistic RA Percent Paced: 18.98 %
Brady Statistic RV Percent Paced: 99.64 %
Date Time Interrogation Session: 20250710222123
Implantable Lead Connection Status: 753985
Implantable Lead Connection Status: 753985
Implantable Lead Implant Date: 20230713
Implantable Lead Implant Date: 20230713
Implantable Lead Location: 753859
Implantable Lead Location: 753860
Implantable Lead Model: 3830
Implantable Lead Model: 5076
Implantable Pulse Generator Implant Date: 20230713
Lead Channel Impedance Value: 323 Ohm
Lead Channel Impedance Value: 380 Ohm
Lead Channel Impedance Value: 437 Ohm
Lead Channel Impedance Value: 627 Ohm
Lead Channel Pacing Threshold Amplitude: 0.625 V
Lead Channel Pacing Threshold Amplitude: 0.625 V
Lead Channel Pacing Threshold Pulse Width: 0.4 ms
Lead Channel Pacing Threshold Pulse Width: 0.4 ms
Lead Channel Sensing Intrinsic Amplitude: 2.625 mV
Lead Channel Sensing Intrinsic Amplitude: 2.625 mV
Lead Channel Sensing Intrinsic Amplitude: 3.375 mV
Lead Channel Sensing Intrinsic Amplitude: 3.375 mV
Lead Channel Setting Pacing Amplitude: 1.5 V
Lead Channel Setting Pacing Amplitude: 2 V
Lead Channel Setting Pacing Pulse Width: 0.4 ms
Lead Channel Setting Sensing Sensitivity: 1.2 mV
Zone Setting Status: 755011

## 2023-11-18 ENCOUNTER — Ambulatory Visit: Payer: Self-pay | Admitting: Cardiology

## 2023-11-27 ENCOUNTER — Telehealth: Payer: Self-pay | Admitting: Acute Care

## 2023-11-27 NOTE — Telephone Encounter (Signed)
 Patient was directed to the [facility] phone line but his call was related to his Nov CT chest wo and OV follow up.  Routed message to [facility] group to give him a return call about scheduling.

## 2023-12-15 ENCOUNTER — Ambulatory Visit: Admitting: Internal Medicine

## 2023-12-16 ENCOUNTER — Ambulatory Visit: Admitting: Internal Medicine

## 2023-12-26 ENCOUNTER — Other Ambulatory Visit: Payer: Self-pay | Admitting: Cardiovascular Disease

## 2023-12-29 ENCOUNTER — Other Ambulatory Visit: Payer: Self-pay | Admitting: Cardiovascular Disease

## 2024-01-09 IMAGING — DX DG CHEST 2V
2 series · 2 of 2 positions shown · non-contrast
Comparison: 11/30/2006.  CT, 02/03/2007.

CLINICAL DATA: Paroxysmal atrial fibrillation.

EXAM:
CHEST - 2 VIEW

[chest pa]
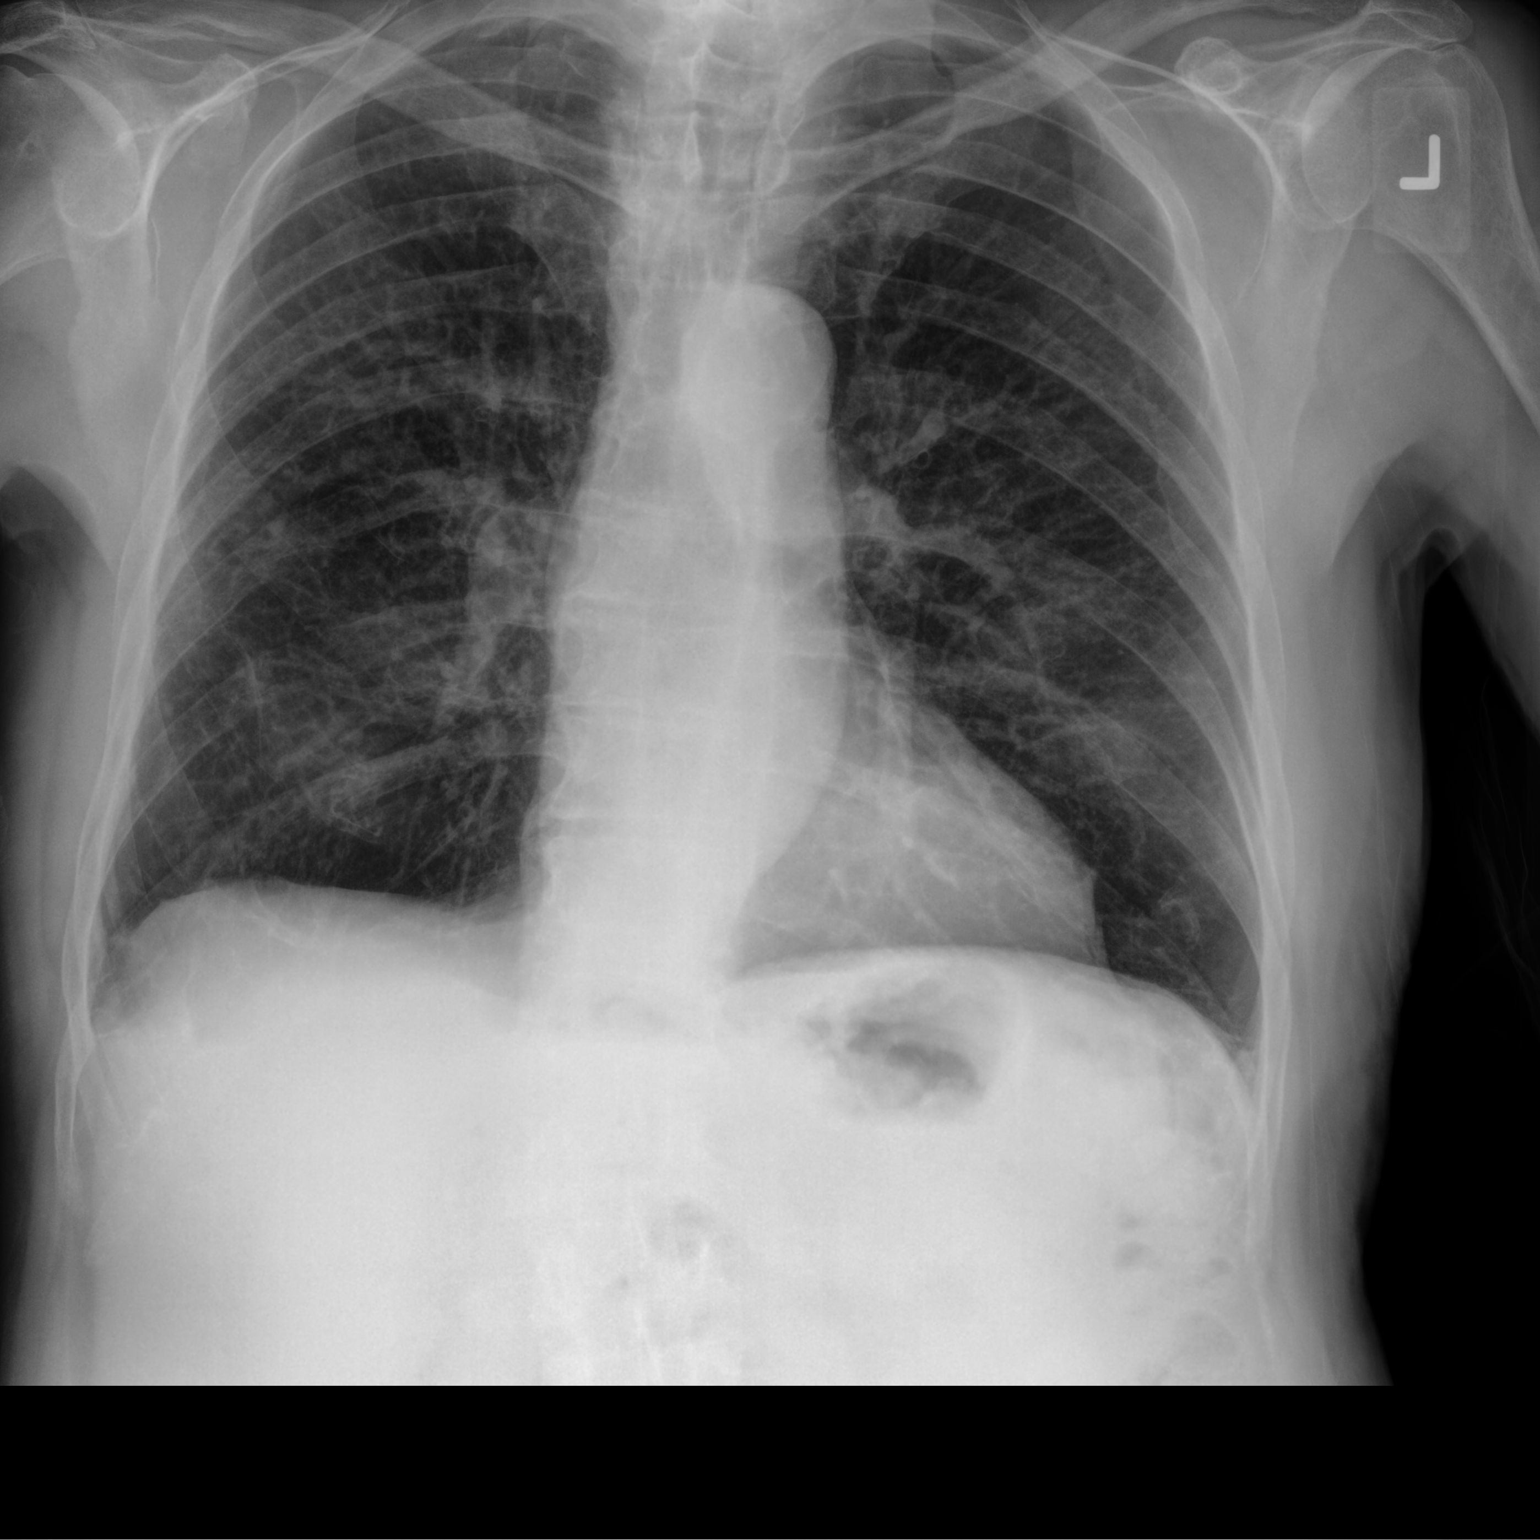

[chest lat]
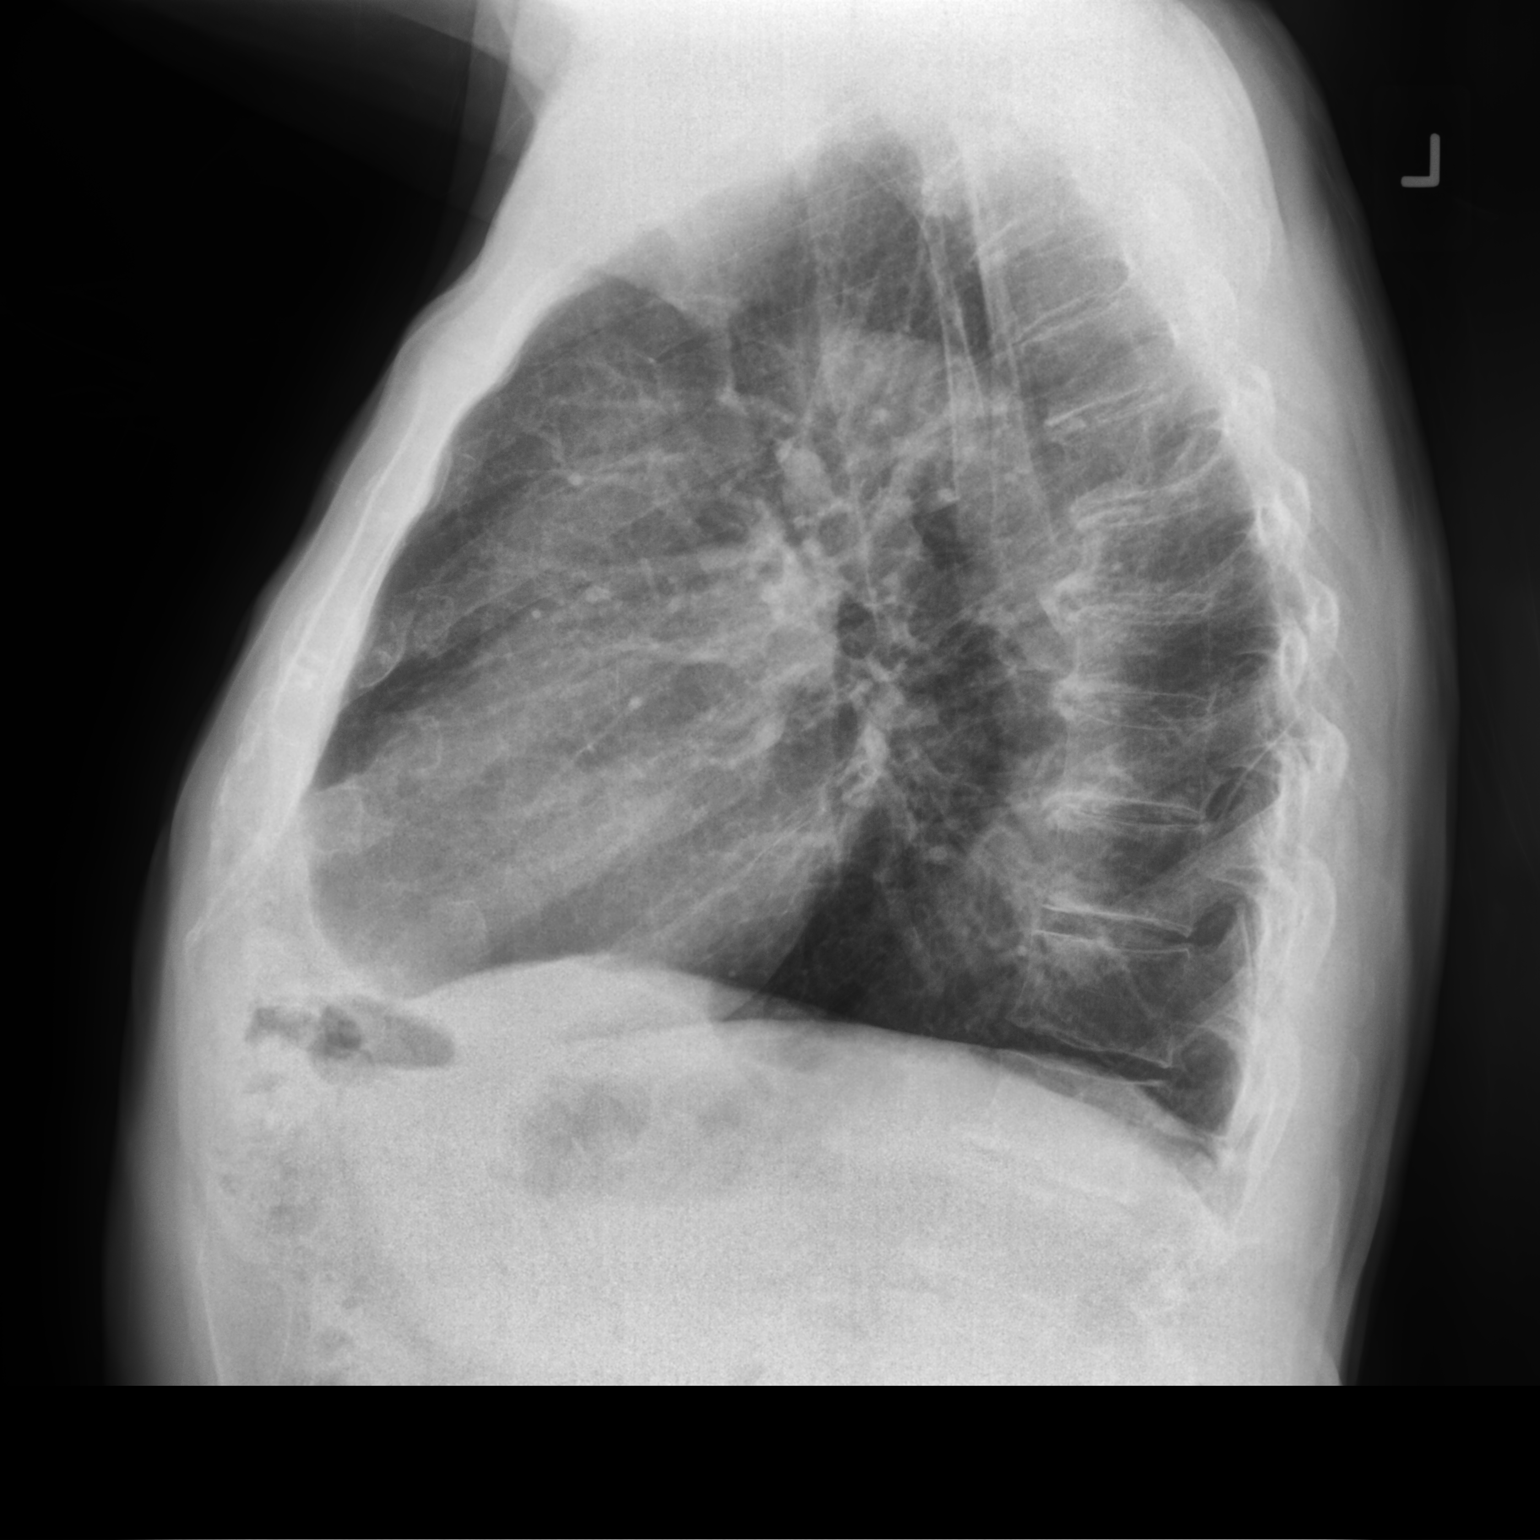

[2 of 2 positions shown; findings below may reference images not displayed]

FINDINGS: Cardiac silhouette is normal in size and configuration. No
mediastinal or hilar masses. No evidence of adenopathy.

Clear lungs.  No pleural effusion or pneumothorax.

Skeletal structures are intact.
IMPRESSION: No active cardiopulmonary disease.

## 2024-02-03 ENCOUNTER — Ambulatory Visit: Admitting: Internal Medicine

## 2024-02-12 NOTE — Progress Notes (Signed)
 Remote PPM Transmission

## 2024-02-13 ENCOUNTER — Ambulatory Visit: Payer: Medicare PPO

## 2024-02-13 DIAGNOSIS — I441 Atrioventricular block, second degree: Secondary | ICD-10-CM

## 2024-02-13 LAB — CUP PACEART REMOTE DEVICE CHECK
Battery Remaining Longevity: 130 mo
Battery Voltage: 3.03 V
Brady Statistic AP VP Percent: 21.86 %
Brady Statistic AP VS Percent: 0.01 %
Brady Statistic AS VP Percent: 77.47 %
Brady Statistic AS VS Percent: 0.66 %
Brady Statistic RA Percent Paced: 22 %
Brady Statistic RV Percent Paced: 99.03 %
Date Time Interrogation Session: 20251010014711
Implantable Lead Connection Status: 753985
Implantable Lead Connection Status: 753985
Implantable Lead Implant Date: 20230713
Implantable Lead Implant Date: 20230713
Implantable Lead Location: 753859
Implantable Lead Location: 753860
Implantable Lead Model: 3830
Implantable Lead Model: 5076
Implantable Pulse Generator Implant Date: 20230713
Lead Channel Impedance Value: 342 Ohm
Lead Channel Impedance Value: 380 Ohm
Lead Channel Impedance Value: 494 Ohm
Lead Channel Impedance Value: 627 Ohm
Lead Channel Pacing Threshold Amplitude: 0.625 V
Lead Channel Pacing Threshold Amplitude: 0.625 V
Lead Channel Pacing Threshold Pulse Width: 0.4 ms
Lead Channel Pacing Threshold Pulse Width: 0.4 ms
Lead Channel Sensing Intrinsic Amplitude: 2.125 mV
Lead Channel Sensing Intrinsic Amplitude: 2.125 mV
Lead Channel Sensing Intrinsic Amplitude: 7 mV
Lead Channel Sensing Intrinsic Amplitude: 7 mV
Lead Channel Setting Pacing Amplitude: 1.5 V
Lead Channel Setting Pacing Amplitude: 2 V
Lead Channel Setting Pacing Pulse Width: 0.4 ms
Lead Channel Setting Sensing Sensitivity: 1.2 mV
Zone Setting Status: 755011

## 2024-02-15 ENCOUNTER — Ambulatory Visit: Payer: Self-pay | Admitting: Cardiology

## 2024-02-16 NOTE — Progress Notes (Signed)
 Remote PPM Transmission

## 2024-03-18 ENCOUNTER — Ambulatory Visit: Admitting: Internal Medicine

## 2024-03-22 NOTE — Progress Notes (Signed)
 Patient ID: [name], male    DOB: 07-06-1932, 89 y.o.   MRN: [id number]  HPI 57 yoM never smoker with past hx of DVT/ PE( 2013, Lupus anticoagulant) and bronchitis, complicated by hx of lung nodule, GERD, vagalinstability, PAFib,  FENO- 03/13/16- 25- borderline for allergic asthma , possibly normal. ----------------------------------------------------------------------------------------------------  10/17/22- 89 year old male never smoker with past history DVT/PE/Xarelto ( 2013, +Lupus aticoagulant), Bronchitis, complicated by history lung nodule, GERD, vagal instability, CKD 3, PAFib, 2deg AVB/ Pacemaker, Gr1DD, Degenerative Disc Disease,  -----Patient doing well-states he got a pacemaker last August He continues Xarelto  long-term for history of PE and A-fib.  No significant bleeding.  Cardiology follows. No acute respiratory events.  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 CXR 11/16/21- IMPRESSION: DPual lead left chest pacing system with atrial and ventricular wire insertions. No acute chest findings.  No pneumothorax.  Mild cardiomegaly.   03/23/24- 89 year old male never smoker with past history DVT/PE/Xarelto ( 2013, +Lupus aticoagulant), Bronchitis, complicated by history lung nodule, GERD, vagal instability, CKD 3, PAFib, 2deg AVB/ Pacemaker, Gr1DD, Degenerative Disc Disease,  -----Patient doing well-states he got a pacemaker last August He continues Xarelto  long-term for history of PE and A-fib. Hosp in July with Sepsis, PAFib, . ED evaluation in Sept for LLE pain- neg for DVT. Discussed the use of AI scribe software for clinical note transcription with the patient, who gave verbal consent to proceed.  History of Present Illness   [name] is an 89 year old male who presents for  folow-up with remote hx DVT/PE and more recent AFib and pacemaker managed  by cardiology.  He has a lung nodule under surveillance, with the last chest x-ray in the current system  conducted two years ago. He mentions ongoing tests at other hospitals, including CXR.SABRA He experienced episodes of sepsis of unknown source in May and July, characterized by shaking, weakness, and fever, with the first episode reaching 104F. He was hospitalized and treated with antibiotics. He has received a flu shot and a shingles vaccine, though he is unsure if he completed the Shingrix series. As I am retiring, I suggested he follow through his PCP and let that doctor refer him for future specialist care if needed. He was comfortable with that.     Assessment and Plan:    Atrial fibrillation with cardiac pacemaker Atrial fibrillation well-controlled with pacemaker. Recent checkup indicates stability. - Continue current pacemaker management.  Pulmonary nodule under surveillance Pulmonary nodule under surveillance. Last chest x-ray two years ago. - Coordinate with primary provider for ongoing management.  History of sepsis (May and July 2023) Severe sepsis episodes in May and July 2023 required hospitalization and antibiotics. Etiology unknown.     Hx DVT/PE-remote Continues anticoagulant -He will ask his PCP for future pulmonary referral closer to his home  ROS-see HPI  + = positive Constitutional:   No-   weight loss, night sweats, fevers, chills, fatigue, lassitude. No bleeding HEENT:   No-  headaches, difficulty swallowing, tooth/dental problems, sore throat,       No-  sneezing, itching, ear ache, + nasal congestion, post nasal drip,  CV:  No-   chest pain, orthopnea, PND, swelling in lower extremities, anasarca, dizziness, palpitations Resp: No-   shortness of breath with exertion or at rest.              No-   productive cough,  No non-productive cough,  No- coughing up of blood.  No-   change in color of mucus.  No- wheezing.   Skin: No-   rash or lesions. GI:  No-   heartburn, indigestion, abdominal pain, nausea, vomiting,  GU: MS:  No-   joint pain or swelling.   Neuro-     ? Vagal instability- eval by his other physicians. Psych:  No- change in mood or affect. No depression or anxiety.  No memory loss.  OBJ- Physical Exam   BP soft but he is comfortable General- Alert, Oriented, Affect-appropriate, Distress- none acute. Looks well Skin- rash-none, lesions- none, excoriation- none Lymphadenopathy- none Head- atraumatic            Eyes- Gross vision intact, PERRLA, conjunctivae and secretions clear            Ears- Hearing, canals-normal            Nose- Clear, +Septal dev and dev external nose,  No-mucus, polyps, erosion, perforation             Throat- Mallampati II , mucosa clear , drainage- none, tonsils- atrophic Neck- flexible , trachea midline, no stridor , thyroid nl, carotid no bruit Chest - symmetrical excursion , unlabored           Heart/CV- RRR , no murmur , no gallop  , no rub, nl s1 s2                           - JVD- none , edema- none, stasis changes- none, varices- none           Lung- clear,  wheeze-none, cough- none , dullness-none, rub- none           Chest wall- +Pacemaker Abd-  Br/ Gen/ Rectal- Not done, not indicated Extrem- cyanosis- none, clubbing, none, atrophy- none, strength- nl Neuro- grossly intact to observation

## 2024-03-23 ENCOUNTER — Ambulatory Visit: Admitting: Internal Medicine

## 2024-03-23 ENCOUNTER — Encounter: Payer: Self-pay | Admitting: Internal Medicine

## 2024-03-23 ENCOUNTER — Ambulatory Visit: Payer: Self-pay | Admitting: Cardiology

## 2024-03-23 ENCOUNTER — Encounter: Payer: Self-pay | Admitting: Student

## 2024-03-23 ENCOUNTER — Ambulatory Visit: Attending: Student | Admitting: Student

## 2024-03-23 VITALS — BP 123/73 | HR 67 | Temp 98.2°F | Ht 70.0 in | Wt 168.2 lb

## 2024-03-23 VITALS — BP 111/76 | HR 82 | Ht 71.0 in | Wt 171.0 lb

## 2024-03-23 DIAGNOSIS — I441 Atrioventricular block, second degree: Secondary | ICD-10-CM

## 2024-03-23 DIAGNOSIS — I480 Paroxysmal atrial fibrillation: Secondary | ICD-10-CM

## 2024-03-23 DIAGNOSIS — I4891 Unspecified atrial fibrillation: Secondary | ICD-10-CM

## 2024-03-23 DIAGNOSIS — Z950 Presence of cardiac pacemaker: Secondary | ICD-10-CM | POA: Diagnosis not present

## 2024-03-23 DIAGNOSIS — D6869 Other thrombophilia: Secondary | ICD-10-CM | POA: Diagnosis not present

## 2024-03-23 DIAGNOSIS — R911 Solitary pulmonary nodule: Secondary | ICD-10-CM

## 2024-03-23 LAB — CUP PACEART INCLINIC DEVICE CHECK
Battery Remaining Longevity: 127 mo
Battery Voltage: 3.03 V
Brady Statistic AP VP Percent: 20.89 %
Brady Statistic AP VS Percent: 0.01 %
Brady Statistic AS VP Percent: 78.75 %
Brady Statistic AS VS Percent: 0.35 %
Brady Statistic RA Percent Paced: 20.94 %
Brady Statistic RV Percent Paced: 99.53 %
Date Time Interrogation Session: 20251118112912
Implantable Lead Connection Status: 753985
Implantable Lead Connection Status: 753985
Implantable Lead Implant Date: 20230713
Implantable Lead Implant Date: 20230713
Implantable Lead Location: 753859
Implantable Lead Location: 753860
Implantable Lead Model: 3830
Implantable Lead Model: 5076
Implantable Pulse Generator Implant Date: 20230713
Lead Channel Impedance Value: 304 Ohm
Lead Channel Impedance Value: 380 Ohm
Lead Channel Impedance Value: 475 Ohm
Lead Channel Impedance Value: 589 Ohm
Lead Channel Pacing Threshold Amplitude: 0.625 V
Lead Channel Pacing Threshold Amplitude: 0.75 V
Lead Channel Pacing Threshold Pulse Width: 0.4 ms
Lead Channel Pacing Threshold Pulse Width: 0.4 ms
Lead Channel Sensing Intrinsic Amplitude: 2.75 mV
Lead Channel Sensing Intrinsic Amplitude: 2.875 mV
Lead Channel Sensing Intrinsic Amplitude: 4.25 mV
Lead Channel Sensing Intrinsic Amplitude: 4.75 mV
Lead Channel Setting Pacing Amplitude: 1.5 V
Lead Channel Setting Pacing Amplitude: 2 V
Lead Channel Setting Pacing Pulse Width: 0.4 ms
Lead Channel Setting Sensing Sensitivity: 1.2 mV
Zone Setting Status: 755011

## 2024-03-23 NOTE — Progress Notes (Signed)
  Electrophysiology Office Note:   ID:  [name], [name] [birth year], MRN [id number]  Primary Cardiologist: Lonni Cash, MD Electrophysiologist: Soyla Gladis Norton, MD      History of Present Illness:   [name] is a 89 y.o. male with h/o CKD stage III, hyperlipidemia, atrial fibrillation, symptomatic bradycardia post pacemaker seen today for routine electrophysiology followup.   Since last being seen in our clinic the patient reports doing well from a cardiac perspective. Overall, he denies chest pain, palpitations, dyspnea, PND, orthopnea, nausea, vomiting, dizziness, syncope, edema, weight gain, or early satiety.   Review of systems complete and found to be negative unless listed in HPI.   EP Information / Studies Reviewed:    EKG is ordered today. Personal review as below.  EKG Interpretation Date/Time:  Tuesday March 23 2024 11:14:04 EST Ventricular Rate:  82 PR Interval:  186 QRS Duration:  96 QT Interval:  370 QTC Calculation: 432 R Axis:   11  Text Interpretation: Atrial-sensed ventricular-paced rhythm When compared with ECG of 11-Mar-2023 14:58, Vent. rate has increased BY  11 BPM Confirmed by Lesia Sharper [phone]) on 03/23/2024 11:16:53 AM    PPM Interrogation-  reviewed in detail today,  See PACEART report.  Arrhythmia/Device History Medtronic Dual Chamber PPM implanted 11/2021 for symptomatic brady / second degree heart block   Physical Exam:   VS:  BP 111/76   Pulse 82   Ht 5' 11 (1.803 m)   Wt 171 lb (77.6 kg)   SpO2 95%   BMI 23.85 kg/m    Wt Readings from Last 3 Encounters:  03/23/24 171 lb (77.6 kg)  10/13/23 162 lb 3.2 oz (73.6 kg)  08/04/23 167 lb (75.8 kg)     GEN: No acute distress  NECK: No JVD; No carotid bruits CARDIAC: Regular rate and rhythm, no murmurs, rubs, gallops RESPIRATORY:  Clear to auscultation without rales, wheezing or rhonchi  ABDOMEN: Soft, non-tender, non-distended EXTREMITIES:  No edema; No  deformity   ASSESSMENT AND PLAN:    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      Disposition:   Follow up with EP Team in 12 months  Signed, Sharper Prentice Lesia, PA-C

## 2024-03-23 NOTE — Patient Instructions (Signed)
 Take good care and enjoy yourself.

## 2024-03-23 NOTE — Patient Instructions (Signed)
 Medication Instructions:  No medication changes today. *If you need a refill on your cardiac medications before your next appointment, please call your pharmacy*  Lab Work: No labwork ordered today. If you have labs (blood work) drawn today and your tests are completely normal, you will receive your results only by: MyChart Message (if you have MyChart) OR A paper copy in the mail If you have any lab test that is abnormal or we need to change your treatment, we will call you to review the results.  Testing/Procedures: No testing ordered today  Follow-Up: At [facility], you and your health needs are our priority.  As part of our continuing mission to provide you with exceptional heart care, our providers are all part of one team.  This team includes your primary Cardiologist (physician) and Advanced Practice Providers or APPs (Physician Assistants and Nurse Practitioners) who all work together to provide you with the care you need, when you need it.  Your next appointment:   12 month(s)  Provider:   You may see Will Gladis Norton, MD or one of the following Advanced Practice Providers on your designated Care Team:   Charlies Arthur, PA-C Michael Andy Tillery, PA-C Suzann Riddle, NP Daphne Barrack, NP

## 2024-03-31 ENCOUNTER — Encounter: Admitting: Cardiology

## 2024-04-14 ENCOUNTER — Other Ambulatory Visit

## 2024-04-14 DIAGNOSIS — R911 Solitary pulmonary nodule: Secondary | ICD-10-CM

## 2024-04-20 ENCOUNTER — Telehealth: Payer: Self-pay

## 2024-04-20 NOTE — Telephone Encounter (Signed)
 Copied from CRM #[id number]. Topic: Appointments - Scheduling Inquiry for Clinic >> Apr 19, 2024  1:34 PM Essie A wrote: Reason for CRM: Patient has a FU for CT scan on 04/23/24.  He would like to know if that can be changed to a phone call instead of office visit.  He would also like to get the results in MyChart. Please return his call at [phone].  Thanks.   Called and spoke with the pt. Pt states he has a busy schedule and lives 1.5 hours away and would prefer visit be virtual. Lauraine can you please advise.

## 2024-04-20 NOTE — Telephone Encounter (Signed)
 Spoke with patient and confirmed video visit or phone call if video does not connect.

## 2024-04-23 ENCOUNTER — Ambulatory Visit: Admitting: Acute Care

## 2024-04-23 ENCOUNTER — Encounter: Payer: Self-pay | Admitting: Acute Care

## 2024-04-23 VITALS — Ht 70.0 in | Wt 165.0 lb

## 2024-04-23 DIAGNOSIS — I714 Abdominal aortic aneurysm, without rupture, unspecified: Secondary | ICD-10-CM

## 2024-04-23 DIAGNOSIS — I7121 Aneurysm of the ascending aorta, without rupture: Secondary | ICD-10-CM | POA: Diagnosis not present

## 2024-04-23 DIAGNOSIS — R9389 Abnormal findings on diagnostic imaging of other specified body structures: Secondary | ICD-10-CM

## 2024-04-23 DIAGNOSIS — R911 Solitary pulmonary nodule: Secondary | ICD-10-CM

## 2024-04-23 DIAGNOSIS — K439 Ventral hernia without obstruction or gangrene: Secondary | ICD-10-CM

## 2024-04-23 DIAGNOSIS — K4090 Unilateral inguinal hernia, without obstruction or gangrene, not specified as recurrent: Secondary | ICD-10-CM | POA: Diagnosis not present

## 2024-04-23 NOTE — Progress Notes (Signed)
 Virtual Visit via Video Note  I connected with [name] on 04/23/2024 at  9:30 AM EST by a video enabled telemedicine application and verified that I am speaking with the correct person using two identifiers.  Location: Patient:  At home Provider:  [address], [place], [place], [address]    I discussed the limitations of evaluation and management by telemedicine and the availability of in person appointments. The patient expressed understanding and agreed to proceed.  [name] is an 89 y.o. male never smoker referred by Dr. Neysa 10/2023 for evaluation of a recently found pulmonary lung nodule.   Synopsis [name] is an  89 year old male with never smoker with PMH of  DVT/ pulmonary embolism who presents for follow-up of a lung nodule. lung nodule was discovered during a hospital stay from May 14 to Sep 22, 2023, when he was admitted for sepsis due to an unspecified organism. No recent pneumonia, but he recalls past scarring in his lungs. He denies  hemoptysis, the scan shows a  6 mm nodule in the right apex. Recommendation is for consideration of a  follow-up CT chest without contrast in 6 months. He had experienced a weight loss of about 10 pounds over the six months prior to admission . He felt his weight loss was related to decrease in appetite.    No history of smoking but he was exposed to secondhand smoke in the office environment 60 years ago. He is currently on Xarelto  for a history of bilateral pulmonary emboli and lupus anticoagulant.He forgot to take a does once, and had a stroke, so maintenance on blood thinners is very important.   Family history includes his mother having had eye cancer, but no other significant family history of cancer is noted. He also reports a hernia that is being monitored.   He worked in photographer, and lives in [place]. He prefers his healthcare management all be under the [facility].  History of Present  Illness: Pt. Presents for follow up CT Chest as surveillance of a lung nodule.  Initial CT chest on Sep 17, 2023 showed a 6 mm nodule in the right apex.  There was also a calcified granuloma in the right lower lobe.  Patient was seen in the pulmonary office on 10/13/2023, plan at that time was for 6-month follow-up CT.  I had asked the patient to call to be seen sooner for any unexplained weight loss or blood in his sputum. Patient is here today to follow-up on that repeat scan.  Patient states he has been doing relatively well.  No significant breathing issues. We have reviewed the results of his December 17 ,2025 CT chest.  This shows no suspicious pulmonary nodules requiring follow-up.  This is great news. There was however an incidental finding of a 4.2 cm ascending aortic aneurysm.  Recommendation was for annual follow-up to monitor for increase in size.  I am going to go ahead and place a referral to thoracic surgery to help with monitoring this aneurysm.  Patient states he also has a desire to be followed by a careers adviser in [place] as he has a hernia that has been under observation.  He would prefer all of his medical care be in [place].  I have made the referral to [facility] surgery.  Patient understands he will get a call to get scheduled for consultation to be seen by a surgeon.  Patient is followed by Dr. Neysa.  Dr.  Young is retiring May 05, 2024.  I have requested that he have 7-month follow-up with Dr. Annella and a 30-minute time slot to establish as a new patient.  Both patient and his wife are in agreement with this plan. As patient will require annual imaging of his abdominal aortic aneurysm we will let that be the one scan annually that he gets.  I am sure thoracic surgery will be happy to monitor any newly developing lung nodules when they reviewed the CTs of the chest.   Observations/Objective: CT Chest 04/14/2024 No suspicious pulmonary nodules requiring  follow-up. 2. 4.2 cm ascending aortic aneurysm. Recommend annual imaging followup by CTA or MRA. 3. Aortic atherosclerosis (ICD10-I70.0). 4. Enlarged pulmonic trunk, indicative of pulmonary arterial hypertension.  CT Chest 09/17/2023 CHEST:  Thoracic inlet/central airways: Thyroid normal. Airway patent.  Mediastinum/hila/axilla: No adenopathy.  Heart/vessels: Normal heart size. No pericardial effusion. Aorta normal in caliber and appearance.  Left subclavian dual-chamber pacemaker.  Lungs/pleura: Mild to moderate emphysema and mild central bronchial thickening consistent with mixed emphysema/airway COPD. Chronic discoid scarring lung bases. 6 mm nodule right apex. Calcified granuloma right lower lobe.     Assessment and Plan: Most recent imaging shows no suspicious pulmonary nodules requiring follow-up.  Never smoker Incidental finding of a 4.2 cm ascending aortic aneurysm Inguinal hernia requiring surgical monitoring for progression Plan Your CT chest shows a stable lung nodule. This is great news. You do not need any further scans per radiology. There was notation of an aortic aneursym . It is 4.2 cm at this time. I have referred you to Thoracic Surgery to have this monitored. You will get a call to get this scheduled.  I have also made a referral to [facility] Surgery for your hernia. You should get a call to get this scheduled. Pt. Will need to be assigned to another MD in this practice.  Please assign to Dr. Annella, and will need a 30 minute new patient visit with him in 3 months. You will get a call closer to the time for this. Call sooner if you need us  sooner.  Have a good Holiday. Please contact office for sooner follow up if symptoms do not improve or worsen or seek emergency care    Follow Up Instructions: Please assign to Dr. Annella, and will need a 30 minute new patient visit with him in 3 months. You will get a call closer to the time for this. Call  sooner if you need us  sooner.  Please contact office for sooner follow up if symptoms do not improve or worsen or seek emergency care    I discussed the assessment and treatment plan with the patient. The patient was provided an opportunity to ask questions and all were answered. The patient agreed with the plan and demonstrated an understanding of the instructions.   The patient was advised to call back or seek an in-person evaluation if the symptoms worsen or if the condition fails to improve as anticipated.  I spent 25 minutes dedicated to the care of this patient on the date of this encounter to include pre-visit review of records, face-to-face time with the patient discussing conditions above, post visit ordering of testing, clinical documentation with the electronic health record, making appropriate referrals as documented, and communicating necessary information to the patient's healthcare team.   Lauraine JULIANNA Lites, NP

## 2024-04-23 NOTE — Patient Instructions (Addendum)
 It is good to see you today. Your CT chest shows a stable lung nodule. This is great news. You do not need any further scans per radiology. There was notation of an aortic aneursym . It is 4.2 cm at this time. I have referred you to Thoracic Surgery to have this monitored. You will get a call to get this scheduled.  I have also made a referral to [facility] Surgery for your hernia. You should get a call to get this scheduled. Pt. Will need to be assigned to another MD in this practice.  Please assign to Dr. Annella, and will need a 30 minute new patient visit with him in 3 months. You will get a call closer to the time for this. Call sooner if you need us  sooner.  Have a good Holiday. Please contact office for sooner follow up if symptoms do not improve or worsen or seek emergency care

## 2024-05-03 ENCOUNTER — Ambulatory Visit

## 2024-05-03 DIAGNOSIS — R911 Solitary pulmonary nodule: Secondary | ICD-10-CM | POA: Diagnosis not present

## 2024-05-03 DIAGNOSIS — I7121 Aneurysm of the ascending aorta, without rupture: Secondary | ICD-10-CM

## 2024-05-03 NOTE — Patient Instructions (Signed)
 Risk Modification in those with ascending thoracic aortic aneurysm:   Continue control of blood pressure (prefer BP 130/80 or less)   2. Avoid fluoroquinolone antibiotics (I.e Ciprofloxacin, Avelox, Levofloxacin, Ofloxacin)   3.  Use of statin (to decrease cardiovascular risk)   4.  Exercise and activity limitations is individualized, but in general, contact sports are to be avoided and one should avoid heavy lifting (defined as half of ideal body weight) and exercises involving sustained Valsalva maneuver.   5.  Follow-up in one year with CT chest.

## 2024-05-03 NOTE — Progress Notes (Addendum)
 "      [address] [place] [address]             [id number]            [name] [id number] 05/23/1932   History of Present Illness:  [name] is an 89 year old man with medical history of pulmonary embolism on long-term anticoagulation, DVT of lower extremity, paroxysmal atrial fibrillation, GERD, chronic kidney disease stage 3, and BPH who presents for initial encounter of ascending thoracic aortic aneurysm.  This was found on CT scan without contrast for lung nodule follow up.  Aneurysm measured 4.2 cm. Echocardiogram from 2023 showed that the aortic valve is tricuspid with mild aortic insuffiencey.   He presents to the clinic today and reports that he is doing well.  No history of hypertension and blood pressure is controlled without the use of medications. He is active with yard work, he denies heavy lifting.  He denies chest pain and shortness of breath.    Medications Ordered Prior to Encounter[1]   ROS: Review of Systems  Constitutional:  Negative for fever and malaise/fatigue.  Respiratory:  Positive for shortness of breath. Negative for cough.   Cardiovascular:  Negative for chest pain, palpitations and leg swelling.     There were no vitals taken for this visit.  Physical Exam Constitutional:      Appearance: Normal appearance.  HENT:     Head: Normocephalic and atraumatic.  Skin:    General: Skin is warm and dry.  Neurological:     General: No focal deficit present.     Mental Status: He is alert and oriented to person, place, and time.      Imaging: CLINICAL DATA:  Lung nodule.   EXAM: CT CHEST WITHOUT CONTRAST   TECHNIQUE: Multidetector CT imaging of the chest was performed following the standard protocol without IV contrast.   RADIATION DOSE REDUCTION: This exam was performed according to the departmental dose-optimization program which includes automated exposure control, adjustment of the mA  and/or kV according to patient size and/or use of iterative reconstruction technique.   COMPARISON:  Outside CT chest 09/17/2023 under MRN [id number].   FINDINGS: Cardiovascular: Atherosclerotic calcification of the aorta. Ascending aorta measures 4.2 cm (coronal image 87). Enlarged pulmonic trunk. Heart is at the upper limits of normal in size. No pericardial effusion.   Mediastinum/Nodes: Thoracic inlet lymph nodes are not enlarged by CT size criteria. No pathologically enlarged mediastinal or axillary lymph nodes. Hilar regions are difficult to definitively evaluate without IV contrast. Esophagus is grossly unremarkable.   Lungs/Pleura: Calcified granulomas. Mild bibasilar scarring. No suspicious pulmonary nodules. No pleural fluid. Airway is unremarkable.   Upper Abdomen: Visualized portions of the liver, adrenal glands, kidneys, spleen, pancreas, stomach and bowel are grossly unremarkable. No upper abdominal adenopathy.   Musculoskeletal: Degenerative changes in the spine.  Osteopenia.   IMPRESSION: 1. No suspicious pulmonary nodules requiring follow-up. 2. 4.2 cm ascending aortic aneurysm. Recommend annual imaging followup by CTA or MRA. This recommendation follows 2010 ACCF/AHA/AATS/ACR/ASA/SCA/SCAI/SIR/STS/SVM Guidelines for the Diagnosis and Management of Patients with Thoracic Aortic Disease. Circulation. 2010; 121: [id number]. Aortic aneurysm NOS (ICD10-I71.9). 3.  Aortic atherosclerosis (ICD10-I70.0). 4. Enlarged pulmonic trunk, indicative of pulmonary arterial hypertension.     Electronically Signed   By: Newell Eke M.D.   On: 04/21/2024 10:38     A/P: Aneurysm of ascending aorta without rupture -4.2 cm ascending thoracic aortic aneurysm on  CT scan of chest. Echocardiogram showed tricuspid aortic valve.  -We discussed the natural history and and risk factors for growth of ascending aortic aneurysms. Discussed recommendations to minimize the risk of  further expansion or dissection including careful blood pressure control, avoidance of contact sports and heavy lifting, attention to lipid management.  We covered the importance of staying never user of tobacco.  The patient does not yet meet surgical criteria of >5.5cm. The patient is aware of signs and symptoms of aortic dissection and when to present to the emergency department   Lung nodule -CT scan showed no suspicious pulmonary nodules -Will continue to follow with CT scans for aneurysm    -Follow up in one year with CT scan of chest without contrast for continued surveillance     Risk Modification:  Statin: not currently prescribed, using ezetimibe  for cholesterol   Smoking cessation instruction/counseling given:  never user  Patient was counseled on importance of Blood Pressure Control  They are instructed to contact their Primary Care Physician if they start to have blood pressure readings over 130s/90s. Do not ever stop blood pressure medications on your own, unless instructed by healthcare professional.  Please avoid use of Fluoroquinolones as this can potentially increase your risk of Aortic Rupture and/or Dissection  Patient educated on signs and symptoms of Aortic Dissection, handout also provided in AVS  Justin CHRISTELLA Rough, PA-C 05/03/2024      [1]  Current Outpatient Medications on File Prior to Visit  Medication Sig Dispense Refill   acetaminophen  (TYLENOL ) 325 MG tablet Take 1-2 tablets (325-650 mg total) by mouth every 4 (four) hours as needed for mild pain.     alfuzosin (UROXATRAL) 10 MG 24 hr tablet Take 10 mg by mouth daily with breakfast.     ammonium lactate (AMLACTIN) 12 % cream Apply 1 Application topically as needed for dry skin.     ascorbic acid (VITAMIN C) 1000 MG tablet Take 500 mg by mouth.     Cholecalciferol (VITAMIN D-3 PO) Take 1 capsule by mouth daily.     Cyanocobalamin (VITAMIN B-12 PO) Take 1 tablet by mouth daily.     diltiazem  (CARDIZEM   CD) 120 MG 24 hr capsule TAKE 1 CAPSULE(120 MG) BY MOUTH DAILY 90 capsule 2   esomeprazole (NEXIUM) 20 MG capsule Take 20 mg by mouth daily at 12 noon.     ezetimibe  (ZETIA ) 10 MG tablet Take 1 tablet (10 mg total) by mouth daily. 30 tablet 11   hydrocortisone  cream 1 % Apply topically 3 (three) times daily. 30 g 0   lubiprostone (AMITIZA) 8 MCG capsule Take 8 mcg by mouth daily as needed for constipation.     XARELTO  15 MG TABS tablet TAKE 1 TABLET(15 MG) BY MOUTH DAILY WITH SUPPER 90 tablet 1   Zinc 30 MG CAPS Take by mouth.     No current facility-administered medications on file prior to visit.   "

## 2024-05-14 ENCOUNTER — Ambulatory Visit: Payer: Medicare PPO

## 2024-05-14 DIAGNOSIS — I441 Atrioventricular block, second degree: Secondary | ICD-10-CM | POA: Diagnosis not present

## 2024-05-16 LAB — CUP PACEART REMOTE DEVICE CHECK
Battery Remaining Longevity: 127 mo
Battery Voltage: 3.03 V
Brady Statistic AP VP Percent: 20.2 %
Brady Statistic AP VS Percent: 0.01 %
Brady Statistic AS VP Percent: 79.65 %
Brady Statistic AS VS Percent: 0.14 %
Brady Statistic RA Percent Paced: 20.11 %
Brady Statistic RV Percent Paced: 99.61 %
Date Time Interrogation Session: 20260108194345
Implantable Lead Connection Status: 753985
Implantable Lead Connection Status: 753985
Implantable Lead Implant Date: 20230713
Implantable Lead Implant Date: 20230713
Implantable Lead Location: 753859
Implantable Lead Location: 753860
Implantable Lead Model: 3830
Implantable Lead Model: 5076
Implantable Pulse Generator Implant Date: 20230713
Lead Channel Impedance Value: 304 Ohm
Lead Channel Impedance Value: 380 Ohm
Lead Channel Impedance Value: 475 Ohm
Lead Channel Impedance Value: 627 Ohm
Lead Channel Pacing Threshold Amplitude: 0.5 V
Lead Channel Pacing Threshold Amplitude: 0.75 V
Lead Channel Pacing Threshold Pulse Width: 0.4 ms
Lead Channel Pacing Threshold Pulse Width: 0.4 ms
Lead Channel Sensing Intrinsic Amplitude: 2.625 mV
Lead Channel Sensing Intrinsic Amplitude: 2.625 mV
Lead Channel Sensing Intrinsic Amplitude: 6 mV
Lead Channel Sensing Intrinsic Amplitude: 6 mV
Lead Channel Setting Pacing Amplitude: 1.5 V
Lead Channel Setting Pacing Amplitude: 2 V
Lead Channel Setting Pacing Pulse Width: 0.4 ms
Lead Channel Setting Sensing Sensitivity: 1.2 mV
Zone Setting Status: 755011

## 2024-05-17 ENCOUNTER — Ambulatory Visit: Payer: Self-pay | Admitting: Cardiology

## 2024-05-18 NOTE — Progress Notes (Signed)
 Remote PPM Transmission

## 2024-07-13 ENCOUNTER — Ambulatory Visit (HOSPITAL_COMMUNITY)

## 2024-07-29 ENCOUNTER — Ambulatory Visit: Admitting: Cardiovascular Disease

## 2024-08-13 ENCOUNTER — Ambulatory Visit

## 2024-11-12 ENCOUNTER — Ambulatory Visit

## 2025-02-11 ENCOUNTER — Ambulatory Visit
